# Patient Record
Sex: Female | Born: 1990 | Race: White | Hispanic: No | Marital: Single | Smoking: Former smoker
Health system: Southern US, Community
[De-identification: ages and names within clinical notes are randomized; demographics above are authoritative.]

## PROBLEM LIST (undated history)

## (undated) DIAGNOSIS — N189 Chronic kidney disease, unspecified: Secondary | ICD-10-CM

## (undated) DIAGNOSIS — F319 Bipolar disorder, unspecified: Secondary | ICD-10-CM

## (undated) DIAGNOSIS — A609 Anogenital herpesviral infection, unspecified: Secondary | ICD-10-CM

## (undated) DIAGNOSIS — F329 Major depressive disorder, single episode, unspecified: Secondary | ICD-10-CM

## (undated) DIAGNOSIS — R300 Dysuria: Secondary | ICD-10-CM

## (undated) DIAGNOSIS — L708 Other acne: Secondary | ICD-10-CM

## (undated) DIAGNOSIS — F1011 Alcohol abuse, in remission: Secondary | ICD-10-CM

## (undated) DIAGNOSIS — R1084 Generalized abdominal pain: Secondary | ICD-10-CM

## (undated) DIAGNOSIS — F32A Depression, unspecified: Secondary | ICD-10-CM

## (undated) DIAGNOSIS — F988 Other specified behavioral and emotional disorders with onset usually occurring in childhood and adolescence: Secondary | ICD-10-CM

## (undated) DIAGNOSIS — Z87448 Personal history of other diseases of urinary system: Secondary | ICD-10-CM

## (undated) DIAGNOSIS — F1010 Alcohol abuse, uncomplicated: Secondary | ICD-10-CM

## (undated) DIAGNOSIS — R21 Rash and other nonspecific skin eruption: Secondary | ICD-10-CM

## (undated) DIAGNOSIS — F419 Anxiety disorder, unspecified: Secondary | ICD-10-CM

## (undated) DIAGNOSIS — J45990 Exercise induced bronchospasm: Secondary | ICD-10-CM

## (undated) DIAGNOSIS — R42 Dizziness and giddiness: Secondary | ICD-10-CM

## (undated) DIAGNOSIS — N946 Dysmenorrhea, unspecified: Secondary | ICD-10-CM

## (undated) DIAGNOSIS — IMO0002 Reserved for concepts with insufficient information to code with codable children: Secondary | ICD-10-CM

## (undated) HISTORY — DX: Dysmenorrhea, unspecified: N94.6

## (undated) HISTORY — DX: Reserved for concepts with insufficient information to code with codable children: IMO0002

## (undated) HISTORY — DX: Anxiety disorder, unspecified: F41.9

## (undated) HISTORY — DX: Chronic kidney disease, unspecified: N18.9

## (undated) HISTORY — DX: Dysuria: R30.0

## (undated) HISTORY — DX: Other acne: L70.8

## (undated) HISTORY — DX: Major depressive disorder, single episode, unspecified: F32.9

## (undated) HISTORY — DX: Other specified behavioral and emotional disorders with onset usually occurring in childhood and adolescence: F98.8

## (undated) HISTORY — DX: Anogenital herpesviral infection, unspecified: A60.9

## (undated) HISTORY — DX: Depression, unspecified: F32.A

## (undated) HISTORY — DX: Generalized abdominal pain: R10.84

## (undated) HISTORY — DX: Exercise induced bronchospasm: J45.990

## (undated) HISTORY — DX: Personal history of other diseases of urinary system: Z87.448

## (undated) HISTORY — DX: Bipolar disorder, unspecified: F31.9

## (undated) HISTORY — DX: Alcohol abuse, uncomplicated: F10.10

## (undated) HISTORY — DX: Alcohol abuse, in remission: F10.11

## (undated) HISTORY — DX: Dizziness and giddiness: R42

## (undated) HISTORY — DX: Rash and other nonspecific skin eruption: R21

---
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
## 2010-02-02 ENCOUNTER — Telehealth: Payer: Self-pay | Admitting: Internal Medicine

## 2010-02-02 ENCOUNTER — Ambulatory Visit: Payer: Self-pay | Admitting: Internal Medicine

## 2010-02-02 DIAGNOSIS — R3989 Other symptoms and signs involving the genitourinary system: Secondary | ICD-10-CM | POA: Insufficient documentation

## 2010-02-02 LAB — CONVERTED CEMR LAB
Glucose, Urine, Semiquant: NEGATIVE
Nitrite: NEGATIVE
Specific Gravity, Urine: >=1.03
Urobilinogen, UA: 0.2

## 2010-02-03 ENCOUNTER — Encounter: Payer: Self-pay | Admitting: Internal Medicine

## 2010-02-18 ENCOUNTER — Ambulatory Visit
Admission: RE | Admit: 2010-02-18 | Discharge: 2010-02-18 | Payer: Self-pay | Source: Home / Self Care | Attending: Internal Medicine | Admitting: Internal Medicine

## 2010-02-18 ENCOUNTER — Other Ambulatory Visit: Payer: Self-pay | Admitting: Internal Medicine

## 2010-02-18 LAB — URINALYSIS, ROUTINE W REFLEX MICROSCOPIC
Bilirubin Urine: NEGATIVE
Ketones, ur: 15
Leukocytes, UA: NEGATIVE
Nitrite: NEGATIVE
Specific Gravity, Urine: 1.025 (ref 1.000–1.030)
Urine Glucose: NEGATIVE
Urobilinogen, UA: 0.2 (ref 0.0–1.0)
pH: 6 (ref 5.0–8.0)

## 2010-03-02 ENCOUNTER — Encounter: Payer: Self-pay | Admitting: Internal Medicine

## 2010-03-03 ENCOUNTER — Ambulatory Visit
Admission: RE | Admit: 2010-03-03 | Discharge: 2010-03-03 | Payer: Self-pay | Source: Home / Self Care | Attending: Internal Medicine | Admitting: Internal Medicine

## 2010-03-03 ENCOUNTER — Encounter: Payer: Self-pay | Admitting: Internal Medicine

## 2010-03-03 DIAGNOSIS — N926 Irregular menstruation, unspecified: Secondary | ICD-10-CM | POA: Insufficient documentation

## 2010-03-03 DIAGNOSIS — G479 Sleep disorder, unspecified: Secondary | ICD-10-CM | POA: Insufficient documentation

## 2010-03-03 LAB — CONVERTED CEMR LAB
Beta hcg, urine, semiquantitative: NEGATIVE
Bilirubin Urine: NEGATIVE
Blood in Urine, dipstick: NEGATIVE
Glucose, Urine, Semiquant: NEGATIVE
Nitrite: NEGATIVE
Specific Gravity, Urine: >=1.03
Urobilinogen, UA: 0.2
WBC Urine, dipstick: NEGATIVE
pH: 6

## 2010-03-09 LAB — CONVERTED CEMR LAB
Chlamydia, Swab/Urine, PCR: NEGATIVE
GC Probe Amp, Urine: NEGATIVE

## 2010-03-11 ENCOUNTER — Ambulatory Visit
Admission: RE | Admit: 2010-03-11 | Discharge: 2010-03-11 | Payer: Self-pay | Source: Home / Self Care | Attending: Pediatrics | Admitting: Pediatrics

## 2010-03-12 ENCOUNTER — Telehealth: Payer: Self-pay | Admitting: Internal Medicine
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
## 2010-03-17 NOTE — Letter (Signed)
Summary: [facility]   Imported By: Maryln Gottron 06/25/2009 14:28:55  _____________________________________________________________________  External Attachment:    Type:   Image     Comment:   External Document
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
## 2010-03-17 NOTE — Assessment & Plan Note (Signed)
Summary: FLU SHOT/CCM   Nurse Visit       Influenza Vaccine    Vaccine Type: Fluvax 3+    Given by: Arcola Jansky, RN  Flu Vaccine Consent Questions    Do you have a history of severe allergic reactions to this vaccine? no    Any prior history of allergic reactions to egg and/or gelatin? no    Do you have a sensitivity to the preservative Thimersol? no    Do you have a past history of Guillan-Barre Syndrome? no    Do you currently have an acute febrile illness? no    Have you ever had a severe reaction to latex? no    Vaccine information given and explained to patient? yes    Are you currently pregnant? no   Impression & Recommendations:  Problem # 1:  Preventive Health Care (ICD-V70.0) lot [id number], EXP 30 jun 09, sanofi pasteur left deltoid IM, 0.5 cc.   Other Orders: Flu Vaccine 64yrs + (09811) Admin 1st Vaccine (91478)   Orders Added: 1)  Flu Vaccine 3yrs + [90658] 2)  Admin 1st Vaccine [email or web address]    ]

## 2010-03-17 NOTE — Progress Notes (Signed)
Summary: ov asap-scab passed during period vs blood clot  Phone Note Call from Patient Call back at Home Phone [phone]   Caller: Mom-laura Call For: Madelin Headings MD Summary of Call: pt has something going on in private area req ov asap Initial call taken by: Heron Sabins,  September 03, 2009 10:04 AM  Follow-up for Phone Call        Spoke to pt's mom and she said that a scab came out of her daughter while her daugher is on her period. This is her 2nd day of her period. Mom looked at it and it didn't look like a blood clot because mom gets these all the time.  Bobbie at Dr. Sanjuana Letters office discuss with pt about going on Wellbutrin but couldn't rx. So pt told mom that she does want to do this to help her quit smoking.  Follow-up by: Romualdo Bolk, CMA Duncan Dull),  September 03, 2009 10:31 AM  Additional Follow-up for Phone Call Additional follow up Details #1::        I also spoke to pt and she stated that when she was changing her tampon. She saw what looked like tissue on the tampon. It was 2 in long. It was more dark in color and roundish. She is unsure if it was a scab or not.  Additional Follow-up by: Romualdo Bolk, CMA (AAMA),  September 03, 2009 11:20 AM    Additional Follow-up for Phone Call Additional follow up Details #2::    doesnt sound alarming... call if recurring.   and bring in  specimen if needed. Also  have Developmental associates get [place] a summary  of their  recent recs   wellbutrin  and then we can have Ov to discuss Follow-up by: Madelin Headings MD,  September 03, 2009 5:24 PM  Additional Follow-up for Phone Call Additional follow up Details #3:: Details for Additional Follow-up Action Taken: Pt aware of this and will have Dr. Sanjuana Letters office fax over their recommendations. Additional Follow-up by: Romualdo Bolk, CMA (AAMA),  September 03, 2009 5:26 PM

## 2010-03-17 NOTE — Progress Notes (Signed)
Summary: refill  Phone Note Refill Request Message from:  Fax from Pharmacy  Refills Requested: Medication #1:  YASMIN 28 3-0.03 MG  TABS 1 by mouth once daily [facility] fax---[phone]  Initial call taken by: Warnell Forester,  March 18, 2009 9:19 AM  Follow-up for Phone Call        Per Dr. Fabian Sharp- okay x 1 but needs a wellness check.  Follow-up by: Romualdo Bolk, CMA (AAMA),  March 18, 2009 5:20 PM  Additional Follow-up for Phone Call Additional follow up Details #1::        Rx sent electronically. Left message for mom to have pt call back and schedule pt for a cpx. Additional Follow-up by: Romualdo Bolk, CMA (AAMA),  March 19, 2009 1:35 PM    Prescriptions: YASMIN 28 3-0.03 MG  TABS (DROSPIRENONE-ETHINYL ESTRADIOL) 1 by mouth once daily  #3 packs x 0   Entered by:   Romualdo Bolk, CMA (AAMA)   Authorized by:   Madelin Headings MD   Signed by:   Romualdo Bolk, CMA (AAMA) on 03/19/2009   Method used:   Electronically to        [facility]* (mail-order)             ,          Ph: [phone]       Fax: [phone]   RxID:   [id number]

## 2010-03-17 NOTE — Progress Notes (Signed)
Summary: What kind of OV does this pt need?   Phone Note Call from Patient Call back at Home Phone [phone]   Caller: PATIENT MOTHER Call For: [facility]  Summary of Call: COUPLE OF ASTHMA EPISODES.  HER MENTRAUL CYCLES AND SOME ISSUES WITH THAT  Initial call taken by: Roselle Locus,  April 20, 2007 10:53 AM  Follow-up for Phone Call        [facility], pt should be scheduled for OV with Dr Fabian Sharp.  Pt mother called back, has a question about what kind of appt her 20 year old needs.  Two problems, asthma/breathing problems and menstural/teenage concerns.  Mom want to discuss "the pill" with Dr Fabian Sharp and daughter at [facility]. Can both problems be taken care of at same OV? Follow-up by: Sid Falcon LPN,  April 20, 2007 2:48 PM  Additional Follow-up for Phone Call Additional follow up Details #1::        Ok to do either a Tuew or Wed at 4pm and block next slot or next [facility]. Additional Follow-up by: Romualdo Bolk, CMA,  April 21, 2007 2:45 PM    Additional Follow-up for Phone Call Additional follow up Details #2::    Pt is being scheduled to 3/13 @ 4 pm. ..................................................................Marland KitchenSid Falcon LPN  April 20, 1476 4:59 PM

## 2010-03-17 NOTE — Assessment & Plan Note (Signed)
Summary: ROV AND PAP/PELVIC OV/NTA   Vital Signs:  Patient Profile:   20 Years Old Female Height:     64.25 inches (164.47 cm) Weight:      112 pounds Pulse rate:   100 / minute BP sitting:   104 / 62  (right arm) Cuff size:   regular  Vitals Entered By: Romualdo Bolk, CMA (January 22, 2008 4:07 PM)                 Chief Complaint:  Pap.  History of Present Illness: Alexis [name] is here for a pap and pelvic. and .fu of medication  LMP: 1 month ago.  No se of med and thinks it helps  mood somewhat .    Sleeping ok.  NO new signs , Somewhat with cramps.  Feels like she wants to stay on the med.   NOn smoker . NO CP sob or swelling.  Asthma stable (EIA) ADHD NO change  on meds.     Prior Medications Reviewed Using: Patient Recall  Prior Medication List:  VYVANSE 50 MG  CAPS (LISDEXAMFETAMINE DIMESYLATE) 1 by mouth once daily MULTIPLE VITAMIN   TABS (MULTIPLE VITAMIN)  YASMIN 28 3-0.03 MG  TABS (DROSPIRENONE-ETHINYL ESTRADIOL) 1 by mouth once daily PROVENTIL HFA 108 (90 BASE) MCG/ACT  AERS (ALBUTEROL SULFATE) 1-2 puffs q6 hours prn   Current Allergies (reviewed today): No known allergies   Past Medical History:    ADHD    asthma  mostly EIA  with some allergy    Birth [redacted] weeks gestational DM  8-8 oz    Family History:    uncle with seizure ? MR    Mom had MI     no scd arrythmia in family.     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   Lungs:      Clear to ausc, no crackles, rhonchi or wheezing, no grunting, flaring or  retractions  Heart:      RRR without murmur  Abdomen:      BS+, soft, non-tender, no masses, no hepatosplenomegaly  Genitalia:      Pelvic Exam External: normal female genitalia without lesions or masses Vagina: normal without lesions or masses Cervix: normal without lesions or masses flat  ectopy 1+ Adnexa: normal bimanual exam without masses or fullness Uterus: normal by palpation Pap smear: performed Pulses:      femoral pulses present  Extremities:      no cce  Neurologic:      non focal Developmental:      alert and cooperative    Review of Systems  The patient denies anorexia, fever, weight loss, chest pain, peripheral edema, prolonged cough, abdominal pain, abnormal bleeding, and enlarged lymph nodes.  Impression & Recommendations:  Problem # 1:  PRIMARY DYSMENORRHEA (ICD-625.3) normal  exam  no se of meds at present Her updated medication list for this problem includes:    Yasmin 28 3-0.03 Mg Tabs (Drospirenone-ethinyl estradiol) .Marland Kitchen... 1 by mouth once daily  Orders: Est. Patient Level IV (16109) Obtaining Screening PAP Smear (U0454)   Problem # 2:  PREMENSTRUAL TENSION SYNDROMES MOOD (ICD-625.4) Assessment: Improved  Orders: Est. Patient Level IV (09811) Obtaining Screening PAP Smear (B1478)   Problem # 3:  ATTENTION DEFICIT DISORDER (ICD-314.00) no change no obv se of meds Her updated medication list for this problem includes:    Vyvanse 50 Mg Caps (Lisdexamfetamine dimesylate) .Marland Kitchen... 1 by mouth once daily  Orders: Est. Patient Level IV (29562) Obtaining Screening PAP Smear (Z3086)   Problem # 4:  ACNE VULGARIS (ICD-706.1) some improvement  Orders: Est. Patient Level IV (57846) Obtaining Screening PAP Smear (N6295)    Patient Instructions: 1)  refill med   and rov in 6 months for med check etc or as needed .    Prescriptions: YASMIN 28 3-0.03 MG  TABS (DROSPIRENONE-ETHINYL ESTRADIOL) 1 by mouth once daily  #3 packs x 3   Entered by:    Romualdo Bolk, CMA   Authorized by:   Madelin Headings MD   Signed by:   Romualdo Bolk, CMA on 01/22/2008   Method used:   Electronically to        [facility]* (mail-order)             ,          Ph: [phone]       Fax: [phone]   RxID:   [id number]  ]

## 2010-03-17 NOTE — Assessment & Plan Note (Signed)
Summary: uti/dm   Vital Signs:  Patient profile:   20 year old female Menstrual status:  irregular LMP:     08/01/2008 Height:      64.25 inches Weight:      112 pounds BMI:     19.14 Temp:     98.1 degrees F oral Pulse rate:   66 / minute BP sitting:   100 / 70  (right arm) Cuff size:   regular  Vitals Entered By: Romualdo Bolk, CMA (August 08, 2008 11:01 AM) CC: Burning upon urination, frequency that started on 08/05/08 LMP (date): 08/01/2008 LMP - Character: spotting  years   days  Menstrual Status irregular Enter LMP: 08/01/2008 Last PAP Result NEGATIVE FOR INTRAEPITHELIAL LESIONS OR MALIGNANCY.   History of Present Illness: Alexis [name] comes in today  for acute problem with above .sx   She has had 4-5 days of dyuria vaginal soreness some discharge but no abdominal pain and   No fever  or rash   Swollen glands ? neck.  No antibiotics no yeast rx. No hx of similar .sx .   New partner x 1 with condom about 2 weeks ago.   Diarrhea and hives for 1 days weeks ago.   Preventive Screening-Counseling & Management  Alcohol-Tobacco     Alcohol drinks/day: <1     Smoking Status: never  Caffeine-Diet-Exercise     Caffeine use/day: 1-4     Does Patient Exercise: yes  Current Medications (verified): 1)  Vyvanse 50 Mg  Caps (Lisdexamfetamine Dimesylate) .Marland Kitchen.. 1 By Mouth Once Daily 2)  Multiple Vitamin   Tabs (Multiple Vitamin) 3)  Yasmin 28 3-0.03 Mg  Tabs (Drospirenone-Ethinyl Estradiol) .Marland Kitchen.. 1 By Mouth Once Daily 4)  Proventil Hfa 108 (90 Base) Mcg/act  Aers (Albuterol Sulfate) .Marland Kitchen.. 1-2 Puffs Q6 Hours Prn  Allergies (verified): No Known Drug Allergies  Past History:  Past medical, surgical, family and social histories (including risk factors) reviewed, and no changes noted (except as noted below).  Past Medical History: Reviewed history from 01/22/2008 and no changes required. ADHD asthma  mostly EIA  with some allergy Birth [redacted] weeks gestational DM  8-8 oz     Past Surgical History: Reviewed history from 04/28/2007 and no changes required. None  Past History:  Care Management: None Current  Family History: Reviewed history from 01/22/2008 and no changes required. uncle with seizure ? MR Mom had MI  no scd arrythmia in family.  No blood clots DM Asthma allergy    Social History: Reviewed history from 01/22/2008 and no changes required. 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    Physical Exam  General:  Well-developed,well-nourished,in no acute distress; alert,appropriate and cooperative throughout examination Head:  normocephalic and atraumatic.   Neck:  No deformities, masses, or tenderness noted. Lungs:  normal respiratory effort and no intercostal retractions.   Heart:  normal rate and regular rhythm.   Abdomen:  Bowel sounds positive,abdomen soft and non-tender without masses, organomegaly or hernias noted. Genitalia:  scattered small ulcer and pustules that are discrete and tender  cx os clear pink white dc no ulcers   culture obtained Skin:  turgor normal, color normal, no ecchymoses, and no petechiae.   Cervical Nodes:  No lymphadenopathy noted Axillary Nodes:  No palpable lymphadenopathy Inguinal Nodes:  right  tender inguinal node mobile 1.5 cm   Psych:  Oriented X3, good eye contact, and not anxious appearing.  appropriately concerned tearful .    Impression & Recommendations:  Problem # 1:   MUCOSITIS ULCERATIVE OF CERVIX VAGINA AND VULVA (ICD-616.81)  Orders: T- * Misc. Laboratory test [phone]) T-Culture, Urine ([id number])  Problem # 2:  ? of HERPETIC ULCERATION OF VULVA (ICD-054.12) Assessment: Comment Only counseled  and disc about rx pending culture . Disc transmissioin  etc.  empiric treatment at present..   Plan getting hiv rpr etc when  returns.   Problem # 3:  DYSURIA (ICD-788.1)  Orders: UA Dipstick w/o Micro (automated)  (81003)  Complete Medication List: 1)  Vyvanse 50 Mg Caps (Lisdexamfetamine dimesylate) .Marland Kitchen.. 1 by mouth once daily 2)  Multiple Vitamin Tabs (Multiple vitamin) 3)  Yasmin 28 3-0.03 Mg Tabs (Drospirenone-ethinyl estradiol) .Marland Kitchen.. 1 by mouth once daily 4)  Proventil Hfa 108 (90 Base) Mcg/act Aers (Albuterol sulfate) .Marland Kitchen.. 1-2 puffs q6 hours prn 5)  Valacyclovir Hcl 1000 Mg Tabs (Valacyclovir hcl) .Marland Kitchen.. 1 by mouth two times a day for 10 days then 1 by mouth once daily or as directed.  Other Orders: T-GC Probe, urine [phone]) T-Chlamydia  Probe, urine [phone])  Patient Instructions: 1)  You will be informed of lab results when available.  cell [id number] 2)  take medication as dicussed. 3)  return office visit in   3-4 weeks  or as needed and plan blood tests then.  Prescriptions: VALACYCLOVIR HCL 1000 MG TABS (VALACYCLOVIR HCL) 1 by mouth two times a day for 10 days then 1 by mouth once daily or as directed.  #40 x 3   Entered and Authorized by:   Madelin Headings MD   Signed by:   Madelin Headings MD on 08/08/2008   Method used:   Print then Give to Patient   RxID:   [id number]   Laboratory Results   Urine Tests    Routine Urinalysis   Color: yellow Appearance: Clear Glucose: negative   (Normal Range: Negative) Bilirubin: 1+   (Normal Range: Negative) Ketone: negative   (Normal Range: Negative) Spec. Gravity: >=1.030   (Normal Range: 1.003-1.035) Blood: 2+   (Normal Range: Negative) pH: 6.5   (Normal Range:  5.0-8.0) Protein: 2+   (Normal Range: Negative) Urobilinogen: 1.0   (Normal Range: 0-1) Nitrite: negative   (Normal Range: Negative) Leukocyte Esterace: 1+   (Normal Range: Negative)    Comments: Rita Ohara  August 08, 2008 11:27 AM

## 2010-03-17 NOTE — Assessment & Plan Note (Signed)
Summary: full physical/mae  ROV    Vital Signs:  Patient Profile:   20 Years Old Female Height:     64.25 inches (164.47 cm) Weight:      107 pounds BMI:     18.29 Pulse rate:   78 / minute BP sitting:   100 / 78  (right arm) Cuff size:   regular  Vitals Entered By: Romualdo Bolk, CMA (July 18, 2007 3:58 PM)                 Chief Complaint:  Follow up.  History of Present Illness: Alexis [name] is here for a follow up on labs.  and medication .  feels ocp helps pms mood and acne   and premenstrually.   bleeds  about 5-7 days.   SOme worry irritability  no depression    recent family stresses GF died after stroke and GM having memory problems  Vyvanse dose has decreased to 50 once daily ? if adds to mood issue.  To go on trip to [place] this summer .      Prior Medications Reviewed Using: Patient Recall  Prior Medication List:  VYVANSE 50 MG  CAPS (LISDEXAMFETAMINE DIMESYLATE) 2 by mouth once daily MULTIPLE VITAMIN   TABS (MULTIPLE VITAMIN)  ALBUTEROL 90 MCG/ACT  AERS (ALBUTEROL) 1-2 puffs q 6 hours prn YASMIN 28 3-0.03 MG  TABS (DROSPIRENONE-ETHINYL ESTRADIOL) 1 by mouth once daily PROVENTIL HFA 108 (90 BASE) MCG/ACT  AERS (ALBUTEROL SULFATE) 1-2 puffs q6 hours prn   Current Allergies (reviewed today): No known allergies   Past Medical History:    ADHD    asthma  mostly EIA  with some allergy    Birth [redacted] weeks gestational DM  8-8 oz   Family History:    uncle with seizure ? MR    Mom had MI     no scd arrythmia in family.     No blood clots    DM    Asthma allergy   Social History:    [employer]  junior      hh4      dog     no ets    No TAD     relationship of 2 years      sa     Physical Exam  General:      Well appearing adolescent,no acute distress Head:      normocephalic and atraumatic  Neck:      supple without adenopathy  Lungs:      Clear to ausc, no crackles, rhonchi or wheezing, no grunting, flaring or  retractions  Heart:      RRR without murmur  Abdomen:      BS+, soft, non-tender, no masses, no hepatosplenomegaly  Musculoskeletal:      no acute changes Neurologic:      non focal Skin:      papules on face few  Cervical nodes:      no significant adenopathy.   Psychiatric:      alert and cooperative  Additional Exam:      see labs  reviewed   Review of Systems       no new symptom weras glasses no ad pain vag symptom     Impression & Recommendations:  Problem # 1:  PREMENSTRUAL TENSION SYNDROMES MOOD (ICD-625.4) improved from pt  not from moms account  Orders: Est. 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 1 by mouth once daily  Orders: Est. Patient Level IV (16109)   Problem # 3:  ACNE VULGARIS (ICD-706.1) some improved Orders: Est. Patient Level IV (60454)   Problem # 4:  recent deaths in family   counseled about all above   more than 50% of visit       25 minutes   Problem # 5:  ATTENTION DEFICIT DISORDER (ICD-314.00)  Her updated medication list for this problem includes:    Vyvanse 50 Mg Caps (Lisdexamfetamine dimesylate) .Marland Kitchen... 1 by mouth once daily   Medications Added to Medication List This Visit: 1)  Vyvanse 50 Mg Caps (Lisdexamfetamine dimesylate) .Marland Kitchen.. 1 by mouth once daily  Other Orders: Menactra IM (09811) State- Hepatitis A Vacc Ped/Adol 2 dose (91478G) Admin 1st Vaccine (95621) Admin of Any Addtl Vaccine (30865)   Patient Instructions: 1)  schedule rov and will do pap and pelvic 4 pm appt ok in  about 46months    Prescriptions: YASMIN 28 3-0.03 MG  TABS (DROSPIRENONE-ETHINYL ESTRADIOL) 1 by mouth once daily  #1 pack x 1   Entered and Authorized by:   Madelin Headings MD   Signed by:   Madelin Headings MD on 07/18/2007   Method used:   Print then Give to Patient    RxID:   [id number] YASMIN 28 3-0.03 MG  TABS (DROSPIRENONE-ETHINYL ESTRADIOL) 1 by mouth once daily  #3 x 3   Entered and Authorized by:   Madelin Headings MD   Signed by:   Madelin Headings MD on 07/18/2007   Method used:   Print then Give to Patient   RxID:   [id number]  ]  Meningococcal Vaccine    Vaccine Type: Menactra    Site: left deltoid    Mfr: Sanofi Pasteur    Dose: 0.5 ml    Route: IM    Given by: Romualdo Bolk, CMA    Exp. Date: 06/27/2008    Lot #: [id number]  Hepatitis A Vaccine # 1    Vaccine Type: HepA (State)    Site: right deltoid    Mfr: GlaxoSmithKline    Dose: 0.5 ml    Route: IM    Given by: Romualdo Bolk, CMA    Exp. Date: 06/28/2009    Lot #: [id number]

## 2010-03-17 NOTE — Letter (Signed)
Summary: [facility]   Imported By: Maryln Gottron 06/25/2009 14:27:34  _____________________________________________________________________  External Attachment:    Type:   Image     Comment:   External Document

## 2010-03-17 NOTE — Assessment & Plan Note (Signed)
Summary: ASTHMA FOLLOW UP AND WCC/MHF  VITAL SIGNS    Entered weight:   108 lb.     Calculated Weight:   108 lb.     Height:     64.75 in.     Pulse rate:     60    Blood Pressure:   100/60 mmHg  Calculations    Body Mass Index:     18.18   Family History:    uncle with seizure    Mom had MI     no scd arrythmia in family.     No blood clots  Social History:    [employer]  junior      hh4      dog     no ets    No TAD    History     General health:     Nl     Ilnesses/Injuries:     N     Allergies:       Y     Exercise:       Y      Diet:         Nl     Work:       N     Drivers License:     Y     Menses:       Y     Future plans:         Y      Parent/Adolesc interaction:   NI     Able to interview     adolescent alone:     Y      Additional Comments:   stress with school    but coping well   some sleep problems  .  ? very busy    taking adhd med. Irreg    dairy  Takes  vyvanse   sees Dr Ferd Glassing.  8 am    some depressive feeling when near period.       Heavy frequent periods.     on albuterol    allery to [employer].    uses  pre exercise inhaler    Development/School Performance  Social/Emotional Development     What do you do for fun?:     shopping     Do you ever feel down/depressed:   yes     Who do you confide in     with your feelings?       Mom, boyfriend     Have friends/relatives     tried suicide:           no     Any thoughts of hurting yourself:   no  Physical     Feelings about your appearance?   good     Do you smoke, drink, use drugs?   no     Do you own a gun?     Is one kept in the house:     no  School     Is school work difficult for you?   no     How often are you absent?:     never  Sex     Do you date? Any steady partner:   yes     Any worries/questions about sex:   no     Have you begun having sex?       no   Vital Signs:  Patient Profile:   20 Years Old Female Height:  64.75 inches (164.47 cm) Weight:       108 pounds (49.09 kg) BMI:     18.18 BSA:     1.52 Pulse rate:   60 / minute BP sitting:   100 / 60               Vision Comments: Pt had vision check 1 week ago and now wears glasses. ..................................................................Marland KitchenRomualdo [name], CMA  April 28, 2007 4:07 PM    History of Present Illness: Alexis [name] comes in for wcc ands mom also asked about orthostatic lightheadedness.  Once when she was sick  and still slightly dizzy.  About  1 per week lasts a few  seconds. NO related to exercise and no assoc cp sob or palpitations. also   Acne on chest and back has been somewhat problematic   Mom and teen have noticed Some pms symptom and is a problem.   Moodiness irritability low tolerance and some impulsivity verbally.    Dx add rx by Dr Ferd Glassing with  high dose vyvanse which seems to be quite helpful and according to patient probably does not affect sleep. sleep however is a problem felt secondary to the stresses or junior year and trying to [facility] an a average.   Denies depression otherwise and mom says her own pms symptom got better with hormonal therapy.    Periods slighlty irregular sometimes heavy and has   cramps early in the cycle.   No SA no dysuria abd pain otherwise  Asthma  seems stable uses inhaler pre exercise   and as needed rti trigger .           Prescriptions: PROVENTIL HFA 108 (90 BASE) MCG/ACT  AERS (ALBUTEROL SULFATE) 1-2 puffs q6 hours prn  #1 x 2   Entered and Authorized by:   Madelin Headings MD   Signed by:   Madelin Headings MD on 04/28/2007   Method used:   Print then Give to Patient   RxID:   [id number] ALBUTEROL 90 MCG/ACT  AERS (ALBUTEROL) 1-2 puffs q 6 hours prn  #1 x 2   Entered and Authorized by:   Madelin Headings MD   Signed by:   Madelin Headings MD on 04/28/2007   Method used:   Print then Give to Patient   RxID:   [id number] YASMIN 28 3-0.03 MG  TABS (DROSPIRENONE-ETHINYL ESTRADIOL) 1 by  mouth once daily  #1 x 3   Entered and Authorized by:   Madelin Headings MD   Signed by:   Madelin Headings MD on 04/28/2007   Method used:   Print then Give to Patient   RxID:   [id number]    Past Medical History:    ADHD    asthma  mostly EIA  with some allergy  Past Surgical History:    None   Well Child Visit/Preventive Care  Age:  20 years old female Concerns: Follow up on asthma. Pt needs a refill on albuterol. Pt also wants to discuss acne issue.  Home:     good family relationships, communication between adolescent/parent, and has responsibilities at home Education:     As, good attendance, and special classes; All Honors or AP classes Activities:     sports/hobbies, exercise, friends, and Job; Hotel manager, Job- in [employer] Auto/Safety:     seatbelts, bike helmets, water safety, and sunscreen use Drugs:     no tobacco use, no alcohol use, and no drug use Sex:  abstinence and dating Suicide risk:     feelings of depression, suicidal ideation, and anxiety; Some anxiety- due to school work and some depression around cycle.    Physical Exam  General:      Well appearing adolescent,no acute distress verbal  and somewhat stressed but appropriate  Head:      normocephalic and atraumatic  Eyes:      PERRL, EOMI,  lids normal Ears:      TM's pearly gray with normal light reflex and landmarks, canals clear  Nose:      Clear without Rhinorrhea Mouth:      Clear without erythema, edema or exudate, mucous membranes moist teeht ok Neck:      supple without adenopathy no thyromegaly Chest wall:      no deformities or breast masses noted.  Tanner IV Breast.   Lungs:      Clear to ausc, no crackles, rhonchi or wheezing, no grunting, flaring or retractions  Heart:      RRR without murmur normal S2 and quiet precordium.  normal pulses Abdomen:      BS+, soft, non-tender, no masses, no hepatosplenomegaly  Genitalia:      Tanner IV.   Musculoskeletal:      no  scoliosis, normal gait, normal posture Pulses:      femoral pulses present without delay Extremities:      Well perfused with no cyanosis or deformity noted  Neurologic:      Neurologic exam grossly intact  Developmental:      alert and cooperative  Skin:      acne pustular on anterior chest and few on upper back  face few on the chin  Cervical nodes:      no significant adenopathy.   Axillary nodes:      no significant adenopathy.   Inguinal nodes:      no significant adenopathy.   Psychiatric:      alert and cooperative     Impression & Recommendations:  Problem # 1:  WELLNESS EXAM ADOLESCENT (ICD-V20.2) Limit sweet beverages,get appropriate calcium Vitamin D. Limit screen time, get adequate sleep. Counseled on injury prevention, healthy diet and exercise.  Orders: Est. Patient 12-17 years (16109)   Problem # 2:  POSTURAL LIGHTHEADEDNESS (ICD-780.4) sounds insignificant by hx  had ekg 2008 and was wnl.  plan lab and r/o anemia  and encourage avoid skipping meals etc. Orders: Est. Patient Level III (60454)   Problem # 3:  ATTENTION DEFICIT DISORDER (ICD-314.00) as per Dr Ferd Glassing   did discuss meds and potential side effect  and mood Her updated medication list for this problem includes:    Vyvanse 50 Mg Caps (Lisdexamfetamine dimesylate) .Marland Kitchen... 2 by mouth once daily   Problem # 4:  PREMENSTRUAL TENSION SYNDROMES MOOD (ICD-625.4) probalematic possibley all month so may not fit criteria but reaonable to try medication interventions   not interested in [place] rx   limitations of ocps discussed Orders: Est. Patient Level III (09811)   Problem # 5:  ACNE VULGARIS (ICD-706.1) Assessment: New disc rx options     ok to try hormonal therapy   in addition to topical therapy Orders: Est. Patient Level III (91478)   Problem # 6:  PRIMARY DYSMENORRHEA (ICD-625.3) mild to mod but with acne and pms sx will add ocps no ci to meds Her updated medication list for this problem  includes:    Yasmin 28 3-0.03 Mg Tabs (Drospirenone-ethinyl estradiol) .Marland Kitchen... 1 by mouth once daily  Orders: Est. Patient Level III (09811)   Problem # 7:  EXERCISE INDUCED ASTHMA (ICD-493.81) Assessment: Unchanged refill meds and reviewed use Her updated medication list for this problem includes:    Albuterol 90 Mcg/act Aers (Albuterol) .Marland Kitchen... 1-2 puffs q 6 hours prn    Proventil Hfa 108 (90 Base) Mcg/act Aers (Albuterol sulfate) .Marland Kitchen... 1-2 puffs q6 hours prn  Orders: Est. Patient Level III (91478)   Medications Added to Medication List This Visit: 1)  Vyvanse 50 Mg Caps (Lisdexamfetamine dimesylate) .... 2 by mouth once daily 2)  Multiple Vitamin Tabs (Multiple vitamin) 3)  Albuterol 90 Mcg/act Aers (Albuterol) 4)  Albuterol 90 Mcg/act Aers (Albuterol) .Marland Kitchen.. 1-2 puffs q 6 hours prn 5)  Yasmin 28 3-0.03 Mg Tabs (Drospirenone-ethinyl estradiol) .Marland Kitchen.. 1 by mouth once daily 6)  Proventil Hfa 108 (90 Base) Mcg/act Aers (Albuterol sulfate) .Marland Kitchen.. 1-2 puffs q6 hours prn   Patient Instructions: 1)  Please schedule a follow-up appointment in 3 months. 2)  schedule  fasting cpx labs 3)   in the meantime

## 2010-03-17 NOTE — Miscellaneous (Signed)
Summary: immunizations   Clinical Lists Changes  Observations: Added new observation of HEPBVAX#3: Historical (01/04/2003 9:35) Added new observation of MMR #2: Historical (06/14/1995 9:52) Added new observation of OPV #4: Historical (06/14/1995 9:52) Added new observation of HEPBVAX#2: Historical (06/14/1995 9:35) Added new observation of HEPBVAX#1: Historical (07/06/1993 9:35) Added new observation of MMR #1: Historical (01/04/1992 9:52) Added new observation of OPV #3: Historical (01/04/1992 9:52) Added new observation of DPT #4: Historical (01/04/1992 9:35) Added new observation of DPT #3: Historical (01/19/1991 9:35) Added new observation of OPV #2: Historical (11/10/1990 9:52) Added new observation of DPT #2: Historical (11/10/1990 9:35) Added new observation of OPV #1: Historical (09/04/1990 9:52) Added new observation of DPT #1: Historical (09/04/1990 9:35)      Immunization History:  Hepatitis B Immunization History:    Hepatitis B # 1:  historical (07/06/1993)    Hepatitis B # 2:  historical (06/14/1995)    Hepatitis B # 3:  historical (01/04/2003)  DPT Immunization History:    DPT # 1:  historical (09/04/1990)    DPT # 2:  historical (11/10/1990)    DPT # 3:  historical (01/19/1991)    DPT # 4:  historical (01/04/1992)  Polio Immunization History:    Polio # 1:  historical (09/04/1990)    Polio # 2:  historical (11/10/1990)    Polio # 3:  historical (01/04/1992)    Polio # 4:  historical (06/14/1995)  MMR Immunization History:    MMR # 1:  historical (01/04/1992)    MMR # 2:  historical (06/14/1995)

## 2010-03-17 NOTE — Progress Notes (Signed)
Summary: Tdap not clear on the record you gave him  Phone Note Call from Patient   Caller: father,[phone] Call For: panosh Reason for Call: Talk to Nurse Summary of Call: Needs Carollee Herter to call him today about the form you handed him of [facility] State Immunizations.  It is not clear to him that the TD/Tdap has been given.  He sees it in the date needed column.  She will be withdrawn from college 9-24 if a record that it's been given has not been provided. Initial call taken by: Rudy Jew, RN,  October 30, 2008 2:33 PM  Follow-up for Phone Call        Spoke with dad and explained that pt is going to be okay that the college will accept the immuization record. Follow-up by: Romualdo Bolk, CMA (AAMA),  October 30, 2008 2:46 PM

## 2010-03-17 NOTE — Letter (Signed)
Summary: Out of School  [place] [facility]  [address] [place], [place] [address]   Phone: [phone]  Fax: [phone]    December 22, 2007   Student:  Alexis [name]    To Whom It May Concern:   For Medical reasons, please excuse the above named student from school for the following dates:  Start:   November  5th 2009  End:    November  6th  2009  If you need additional information, please feel free to contact our office.   Sincerely,    Madelin Headings MD    ****This is a legal document and cannot be tampered with.  Schools are authorized to verify all information and to do so accordingly.

## 2010-03-17 NOTE — Letter (Signed)
Summary: [facility]   Imported By: Maryln Gottron 06/25/2009 14:25:05  _____________________________________________________________________  External Attachment:    Type:   Image     Comment:   External Document

## 2010-03-17 NOTE — Progress Notes (Signed)
Summary: FYI on pt's appt for 5/5  Phone Note Call from Patient Call back at [phone]   Caller: Mom-Laura Summary of Call: Spoke to mom- Pt had UTI that went to her kidney's. [facility]. They gave her Cipro for this. They did a urine culture and she will be bring her records. Mom had a kidney stone in the past. Pt is having pt back one side and mom would like her to see [facility] of [facility]. Pt will be back in [employer]. Pt has a cpx appt on Monday. Mom states that she had the pain for 3-4 weeks before she was seen by someone. Pt had a fever 102.0. Pt will be home this summer. Mom thinks she should be done with exam by Friday of next week. Initial call taken by: Romualdo Bolk, CMA (AAMA),  Jun 19, 2009 8:47 AM

## 2010-03-17 NOTE — Assessment & Plan Note (Signed)
Summary: follow up on recent Kidney infection./dm   Vital Signs:  Patient profile:   20 year old female Menstrual status:  irregular LMP:     06/12/2009 Height:      64.25 inches Weight:      119 pounds BMI:     20.34 Pulse rate:   72 / minute BP sitting:   110 / 60  (right arm)  Vitals Entered By: Romualdo Bolk, CMA (AAMA) (Jun 19, 2009 11:34 AM) CC: Follow-up visit on UTI- Pt is still on Cipro. LMP (date): 06/12/2009 LMP - Character: spotting     Enter LMP: 06/12/2009 Last PAP Result NEGATIVE FOR INTRAEPITHELIAL LESIONS OR MALIGNANCY.   History of Present Illness: Alexis [name] comes in  after being treated for pyelonephritis on April 29 at [facility] with parenteral rocephin IVFluids  and then cipro.    full evaluation and blood work showed  e coli sens to all tested. She also has some type of nasal infection rx with topical antibiotic.  Her fever and pain  subsided beginning 2 days into rx .    Getting better  but sleepy and  and right low back pain is all but resolved .     Prev pain  7/10 and now    is "fine."   Last  fever  99 a few days ago. May 1st.  Denies se of medication.  No hematuria or vag signs .  Is having to reschedule   some  of her exams.    Mom was concerned because of family hx of stones  and infection. patient wihtout previous hx of same.   Preventive Screening-Counseling & Management  Alcohol-Tobacco     Alcohol drinks/day: <1     Smoking Status: never  Caffeine-Diet-Exercise     Caffeine use/day: 1-4     Does Patient Exercise: yes  Current Medications (verified): 1)  Vyvanse 50 Mg  Caps (Lisdexamfetamine Dimesylate) .Marland Kitchen.. 1 By Mouth Once Daily 2)  Multiple Vitamin   Tabs (Multiple Vitamin) 3)  Yasmin 28 3-0.03 Mg  Tabs (Drospirenone-Ethinyl Estradiol) .Marland Kitchen.. 1 By Mouth Once Daily 4)  Proventil Hfa 108 (90 Base) Mcg/act  Aers (Albuterol Sulfate) .Marland Kitchen.. 1-2 Puffs Q6 Hours Prn 5)  Valacyclovir Hcl 1000 Mg Tabs (Valacyclovir Hcl) .Marland Kitchen.. 1 By  Mouth Two Times A Day For 10 Days Then 1 By Mouth Once Daily or As Directed.  Allergies (verified): No Known Drug Allergies  Past History:  Past Medical History: ADHD asthma  mostly EIA  with some allergy Birth [redacted] weeks gestational DM  8-8 oz  Right Pyelonephritis  4 2011  Past History:  Care Management: None [facility]  Social History: Uncch history and english  fresh sor[employer] hh4   dog  no ets No TAD  relationship of 2 years   sa   Review of Systems  The patient denies anorexia, fever, weight loss, weight gain, vision loss, chest pain, abdominal pain, melena, hematochezia, severe indigestion/heartburn, hematuria, incontinence, muscle weakness, difficulty walking, abnormal bleeding, enlarged lymph nodes, and angioedema.         still tired  but better.  Physical Exam  General:  Well-developed,well-nourished,in no acute distress; alert,appropriate and cooperative throughout examination Head:  normocephalic and atraumatic.   Eyes:  vision grossly intact, pupils equal, and pupils round.   Ears:  R ear normal and L ear normal.   Mouth:  pharynx pink and moist.   Neck:  No deformities, masses, or tenderness  noted. Lungs:  Normal respiratory effort, chest expands symmetrically. Lungs are clear to auscultation, no crackles or wheezes. Heart:  Normal rate and regular rhythm. S1 and S2 normal without gallop, murmur, click, rub or other extra sounds. Abdomen:  Bowel sounds positive,abdomen soft and non-tender without masses, organomegaly or hernias noted. no flank pain Pulses:  nl cap refill  Extremities:  no clubbing cyanosis or edema  Neurologic:  alert & oriented X3, strength normal in all extremities, and gait normal.   Skin:  turgor normal, color normal, no ecchymoses, no petechiae, and no purpura.   Cervical Nodes:  No lymphadenopathy noted Psych:  Oriented X3, normally interactive, not anxious appearing, and not depressed appearing.     reviewed [facility] notes and labs    Impression & Recommendations:  Problem # 1:  PYELONEPHRITIS RIGHT (ICD-590.80)  by hx convalescing as expected and no symptom of   stone  at present  . Expectant management discussed woth her and etiology of dx. will get [place]  and observe closely .   at this time no need for urology consult unless relapsing or continueing problem.   Orders: Radiology Referral (Radiology)  Complete Medication List: 1)  Vyvanse 50 Mg Caps (Lisdexamfetamine dimesylate) .Marland Kitchen.. 1 by mouth once daily 2)  Multiple Vitamin Tabs (Multiple vitamin) 3)  Yasmin 28 3-0.03 Mg Tabs (Drospirenone-ethinyl estradiol) .Marland Kitchen.. 1 by mouth once daily 4)  Proventil Hfa 108 (90 Base) Mcg/act Aers (Albuterol sulfate) .Marland Kitchen.. 1-2 puffs q6 hours prn 5)  Valacyclovir Hcl 1000 Mg Tabs (Valacyclovir hcl) .Marland Kitchen.. 1 by mouth two times a day for 10 days then 1 by mouth once daily or as directed.  Other Orders: UA Dipstick w/o Micro (automated)  (81003)  Patient Instructions: 1)  your urine is clear today . 2)  finish the antibiotic as directed . 3)  will notify you about getting a renal ultrasound.  4)  And then rov after the result back.  5)  if ANY  signs of concern  call and reasess .   as needed.   Laboratory Results   Urine Tests  Date/Time Recieved: Jun 19, 2009 12:13 PM  Date/Time Reported: Jun 19, 2009 12:13 PM   Routine Urinalysis   Color: yellow Appearance: Clear Glucose: negative   (Normal Range: Negative) Bilirubin: negative   (Normal Range: Negative) Ketone: negative   (Normal Range: Negative) Spec. Gravity: 1.020   (Normal Range: 1.003-1.035) Blood: negative   (Normal Range: Negative) pH: 7.0   (Normal Range: 5.0-8.0) Protein: negative   (Normal Range: Negative) Urobilinogen: 0.2   (Normal Range: 0-1) Nitrite: negative   (Normal Range: Negative) Leukocyte Esterace: negative   (Normal Range: Negative)    Comments: Wynona Canes, CMA  Jun 19, 2009 12:13 PM

## 2010-03-19 NOTE — Assessment & Plan Note (Signed)
Summary: break thru bleeding on Yaz/dm   Vital Signs:  Patient profile:   20 year old female Menstrual status:  irregular LMP:     03/01/2010 Weight:      114 pounds Pulse rate:   88 / minute BP sitting:   120 / 80  (right arm) Cuff size:   regular  Vitals Entered By: Romualdo Bolk, CMA (AAMA) (March 03, 2010 2:35 PM) CC: Break thru bleeding more noticable this last month. But has been going on for awhile. Pt has been know miss pills. Trouble sleeping but has increased Zoloft 100mg . Pt wants to know if this could be a problem with zoloft? LMP (date): 03/01/2010 LMP - Character: spotting Menarche (age onset years): 10    Enter LMP: 03/01/2010 Last PAP Result NEGATIVE FOR INTRAEPITHELIAL LESIONS OR MALIGNANCY.   History of Present Illness: Alexis [name] comes in today  with mom for above.   She co of  Spotting slight most days .    Never been as persistant  this.  although she has had some irregular bleeding in the past.  No recent missed pills and worried about  mood changes  if she changes medication otherwise.  she states that the current medication  helps with pms symptoms .  Saw Psych on Jan 6th at [facility]. 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 She was placed on Zoloft buy Clinical research associate or psychiatrist    was on 75 for a week and had "2 bad  days  " ... trying to get   local care.   she has stopped alcohol. More than three partners in the last few months none recently no vaginal symptoms no, no pain no urinary tract symptoms currently. ADHD: still on five bands that she does not think interferes with her sleep or her mood she believes that helps her.  Preventive Screening-Counseling & Management  Alcohol-Tobacco     Alcohol drinks/day: weekend drinker     Smoking Status: never     Packs/Day: 1/4     Passive Smoke Exposure: no  Caffeine-Diet-Exercise  Caffeine use/day: yes carbonated, yes caffeine, 8-16 oz/day     Diet Comments: all four food groups, good appetite     Does Patient Exercise: yes  Current Medications (verified): 1)  Vyvanse 50 Mg  Caps (Lisdexamfetamine Dimesylate) .Marland Kitchen.. 1 By Mouth Once Daily 2)  Multiple Vitamin   Tabs (Multiple Vitamin) 3)  Yasmin 28 3-0.03 Mg  Tabs (Drospirenone-Ethinyl Estradiol) .Marland Kitchen.. 1 By Mouth Once Daily 4)  Proventil Hfa 108 (90 Base) Mcg/act  Aers (Albuterol Sulfate) .Marland Kitchen.. 1-2 Puffs Q6 Hours Prn 5)  Valacyclovir Hcl 1000 Mg Tabs (Valacyclovir Hcl) .Marland Kitchen.. 1 By Mouth Two Times A Day For 10 Days Then 1 By Mouth Once Daily or As Directed. 6)  Zoloft 100 Mg Tabs (Sertraline Hcl) .Marland Kitchen.. 1 By Mouth Once Daily  Allergies (verified): No Known Drug Allergies  Past History:  Care Management: None [facility]: Dr. Gareth Eagle in [place] and therapist at [facility]; [place] at [employer].   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  hh4   dog  no ets No TAD    sa    Etoh to point of amnesia more than 3 times this  past year  none recently    Review of Systems  The patient denies anorexia, fever, weight loss, weight gain, vision loss, decreased hearing, abdominal pain, severe indigestion/heartburn, hematuria, incontinence, genital sores, transient blindness, difficulty walking, unusual weight change, enlarged lymph nodes, and angioedema.         no unusual bleeding other than the breakthrough bleeding  Physical Exam  General:  Well-developed,well-nourished,in no acute distress; alert,appropriate and cooperative throughout examination  discussion with mother and  alone Head:  normocephalic and atraumatic.   Eyes:  PERRL, EOMs full, conjunctiva clear  Neck:  supple without adenopathy  Lungs:  Normal respiratory effort, chest expands symmetrically. Lungs are clear to auscultation, no  crackles or wheezes. Heart:  Normal rate and regular rhythm. S1 and S2 normal without gallop, murmur, click, rub or other extra sounds. Abdomen:  no flank pain  no abd tendernessno rebound tenderness, no hepatomegaly, and no splenomegaly.   nl bs  Pulses:  pulses intact without delay   Neurologic:  non focal Skin:  turgor normal, color normal, no ecchymoses, and no petechiae.   Cervical Nodes:  No lymphadenopathy noted Psych:  Oriented X3, good eye contact, not anxious appearing, and not depressed appearing.  slightly subdued but genuine and normal cognition   Impression & Recommendations:  Problem # 1:  IRREGULAR MENSES (ICD-626.4) breakthrough bleeding is probably related to the medication but won't rule out infection because of somewhat risk history. No evidence of urinary tract infection today. Her updated medication list for this problem includes:    Zovia 1/35e (28) 1-35 Mg-mcg Tabs (Ethynodiol diac-eth estradiol) .Marland Kitchen... 1 by mouth once daily  Orders: UA Dipstick w/o Micro (automated)  (81003) T-Chlamydia & GC Probe, Urine (87491/[id number])  Problem # 2:  UNSPECIFIED SLEEP DISTURBANCE (ICD-780.50) possibly for medications and diagnosis of reactive anxiety depression ADHD. Encouraged   continued alcohol abstinence  Problem # 3:  ALCOHOL USE (ICD-305.00) not using    Problem # 4:  ATTENTION DEFICIT DISORDER (ICD-314.00)  Complete Medication List: 1)  Vyvanse 70 Mg Caps (Lisdexamfetamine dimesylate) 2)  Multiple Vitamin Tabs (Multiple vitamin) 3)  Zovia 1/35e (28) 1-35 Mg-mcg Tabs (Ethynodiol diac-eth estradiol) .Marland Kitchen.. 1 by mouth once daily 4)  Proventil Hfa 108 (90 Base) Mcg/act Aers (Albuterol sulfate) .Marland Kitchen.. 1-2 puffs q6 hours prn 5)  Valacyclovir Hcl 1000 Mg Tabs (Valacyclovir hcl) .Marland Kitchen.. 1 by mouth two times a day for 10 days then 1 by mouth once daily or as directed. 6)  Zoloft 100 Mg Tabs (Sertraline hcl) .Marland Kitchen.. 1 by mouth once daily  Patient Instructions: 1)  will call about  results . 2)  cal; if you want to try different OCP. 3)  Zoloft could cause  some sleep disturbance but should get better with time .     4)  Make appt with Dr Dub Mikes  for local care .  Prescriptions: VALACYCLOVIR HCL 1000 MG TABS (VALACYCLOVIR HCL) 1 by mouth two times a day for 10 days then 1 by mouth once daily or as directed.  #90 x 3   Entered and Authorized by:   Madelin Headings MD   Signed by:   Madelin Headings MD on 03/03/2010   Method used:   Electronically to        [facility] # [address] (retail)       [address]       [place], [place]  [address]       Ph: [phone]       Fax: [phone]  RxID:   [id number]  cell  40 can call home phone to tell her to cal about results   Orders Added: 1)  UA Dipstick w/o Micro (automated)  [81003] 2)  T-Chlamydia & GC Probe, Urine [87491/[id number]] 3)  Est. Patient Level IV [40102]    Laboratory Results   Urine Tests    Routine Urinalysis   Color: yellow Appearance: Clear Glucose: negative   (Normal Range: Negative) Bilirubin: negative   (Normal Range: Negative) Ketone: trace (5)   (Normal Range: Negative) Spec. Gravity: >=1.030   (Normal Range: 1.003-1.035) Blood: negative   (Normal Range: Negative) pH: 6.0   (Normal Range: 5.0-8.0) Protein: trace   (Normal Range: Negative) Urobilinogen: 0.2   (Normal Range: 0-1) Nitrite: negative   (Normal Range: Negative) Leukocyte Esterace: negative   (Normal Range: Negative)    Urine HCG: negative Comments: Rita Ohara  March 03, 2010 3:50 PM    greater than 50% of visit spent in counseling    25 minutes

## 2010-03-19 NOTE — Progress Notes (Signed)
  Phone Note Call from Patient   Caller: Patient Call For: Alexis Headings MD Summary of Call: Mom took pt to ADHD yesterday, and they want her changed to Zovia 35.   [phone] [facility] [place] Initial call taken by: Lynann Beaver CMA AAMA,  March 12, 2010 9:43 AM  Follow-up for Phone Call        get name of doc  and then ok to  change to zovia 35   disp x 3 months and then call  at that time about bleeding pattern. and status Follow-up by: Alexis Headings MD,  March 12, 2010 9:52 AM  Additional Follow-up for Phone Call Additional follow up Details #1::        Pt saw Benn Moulder, NPA- who works with Dr. Tacy Learn. They changed her dosage to Vyvanse 70mg  and are still discussing her zoloft dosage.  Mom aware that we going to call in rx for pt.  Additional Follow-up by: Romualdo Bolk, CMA (AAMA),  March 12, 2010 9:57 AM    New/Updated Medications: VYVANSE 70 MG CAPS (LISDEXAMFETAMINE DIMESYLATE)  ZOVIA 1/35E (28) 1-35 MG-MCG TABS (ETHYNODIOL DIAC-ETH ESTRADIOL) 1 by mouth once daily Prescriptions: ZOVIA 1/35E (28) 1-35 MG-MCG TABS (ETHYNODIOL DIAC-ETH ESTRADIOL) 1 by mouth once daily  #1 x 2   Entered by:   Romualdo Bolk, CMA (AAMA)   Authorized by:   Alexis Headings MD   Signed by:   Romualdo Bolk, CMA (AAMA) on 03/12/2010   Method used:   Electronically to        [facility] [address] (retail)       [address]       [place], [place]  [address]       Ph: [phone]       Fax: [phone]   RxID:   [id number]

## 2010-03-19 NOTE — Progress Notes (Signed)
Summary: UTI-more info on her symptoms  Phone Note Call from Patient Call back at Home Phone [phone]   Caller: Patient Call For: Madelin Headings MD Reason for Call: Acute Illness Summary of Call: Pt 's Mom wants to bring pt in some time today to bring a specimen in for UTI, and no office visit.  States Dr. Fabian Sharp told her to do this anytime.  Initial call taken by: Lynann Beaver CMA AAMA,  February 02, 2010 10:08 AM  Follow-up for Phone Call        Mom is not with daughter and this is all the information she knows is that she complains of dysuria, and Dr.Olanda Boughner told them to call for a time to just brng a specimen in when she has these probs.  Message left x 2 and Mom would like a message left at home to be seen after 2 if possible. Follow-up by: Lynann Beaver CMA AAMA,  February 02, 2010 11:00 AM  Additional Follow-up for Phone Call Additional follow up Details #1::        ok to do  UA  and culture   but but if having signs of concerns   or the urine is abnormal she will need an OV to treat .   Is mom worried about infection  and does she have fever etc. ?  She had a severe UTI in the past. Additional Follow-up by: Madelin Headings MD,  February 02, 2010 11:16 AM    Additional Follow-up for Phone Call Additional follow up Details #2::    LMTCB again Follow-up by: [facility] CMA AAMA,  February 02, 2010 11:26 AM

## 2010-03-19 NOTE — Assessment & Plan Note (Signed)
Summary: URINE SAMPLE//ALP   for vital signs see previous document labeled  for urine tests lab. done today   History of Present Illness: Alexis [name]  comes in today  for acute work in request because of ? having uti signs .She has a hx of a sever pyelopnepphritis and was told to check if any poss of recurrence.  she has soe change in her urination but not really pain  and no back pain fever or malasie with this.   no new bag signs .  Denies risk of pregnancy . Since last visit is now on zoloft for mood and follow up with psyc    no nse of this med  [facility]  on vyvanse also  Resp no asthma  problem   Preventive Screening-Counseling & Management  Alcohol-Tobacco     Alcohol drinks/day: weekend drinker     Smoking Status: never     Packs/Day: 1/4     Passive Smoke Exposure: no  Caffeine-Diet-Exercise     Caffeine use/day: yes carbonated, yes caffeine, 8-16 oz/day     Diet Comments: all four food groups, good appetite     Does Patient Exercise: yes  Allergies: No Known Drug Allergies  Past History:  Past medical, surgical, family and social histories (including risk factors) reviewed, and no changes noted (except as noted below).  Past Medical History: ADHD  speech therapy 5 years  asthma  mostly EIA  with some allergy Birth [redacted] weeks gestational DM  8-8 oz  Right Pyelonephritis  4 2011 Mood   Past Surgical History: Reviewed history from 04/28/2007 and no changes required. None  Family History: Reviewed history from 07/22/2009 and no changes required. uncle with seizure ? MR Mom had MI  no scd arrythmia in family.  No blood clots DM Asthma allergy   Mom 5 0 fa 6 0   Social History: Reviewed history from 07/22/2009 and no changes required. Uncch history and english   sopho  hh4   dog  no ets No TAD  relationship of 2 years   sa    Etoh to point of amnesia more than 3 times this  past year   none recently    Review of  Systems  The patient denies anorexia, fever, weight loss, weight gain, vision loss, abdominal pain, hematuria, incontinence, genital sores, muscle weakness, abnormal bleeding, and enlarged lymph nodes.   for vital signs see  previous document  Physical Exam  General:  Well-developed,well-nourished,in no acute distress; alert,appropriate and cooperative throughout examination Head:  normocephalic and atraumatic.   Neck:  supple without adenopathy  Lungs:  normal respiratory effort, no intercostal retractions, no accessory muscle use, and normal breath sounds.   Heart:  normal rate, regular rhythm, and no murmur.   Abdomen:  no flank pain  mininmal no no abd tendernessno rebound tenderness, no hepatomegaly, and no splenomegaly.   nl bs  Pulses:  pulses intact without delay   Neurologic:  non focal  Skin:  turgor normal, color normal, no ecchymoses, and no petechiae.   Cervical Nodes:  No lymphadenopathy noted Psych:  Oriented X3, normally interactive, good eye contact, and not anxious appearing.  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 1 by mouth once daily 2)  Multiple Vitamin Tabs (Multiple vitamin) 3)  Yasmin 28 3-0.03 Mg Tabs (Drospirenone-ethinyl estradiol) .Marland Kitchen.. 1 by mouth once daily 4)  Proventil Hfa 108 (90 Base) Mcg/act Aers (Albuterol sulfate) .Marland Kitchen.. 1-2 puffs q6 hours prn 5)  Valacyclovir Hcl 1000 Mg Tabs (Valacyclovir hcl) .Marland Kitchen.. 1 by mouth two times a day for 10 days then 1 by mouth  once daily or as directed. 6)  Sertraline Hcl 50 Mg Tabs (Sertraline hcl) .Marland Kitchen.. 1 by mouth once daily 7)  Nitrofurantoin Monohyd Macro 100 Mg Caps (Nitrofurantoin monohyd macro) .Marland Kitchen.. 1 by mouth two times a day for uti  Patient Instructions: 1)  will contaact about labs   2)  take meds and follow up  as appropriate.  3)  see previous lab document    Orders Added: 1)  Est. Patient Level IV [04540]

## 2010-05-11 ENCOUNTER — Ambulatory Visit (INDEPENDENT_AMBULATORY_CARE_PROVIDER_SITE_OTHER): Payer: BC Managed Care – PPO | Admitting: Internal Medicine

## 2010-05-11 ENCOUNTER — Encounter: Payer: Self-pay | Admitting: Internal Medicine

## 2010-05-11 VITALS — Temp 98.6°F | Wt 122.0 lb

## 2010-05-11 DIAGNOSIS — L738 Other specified follicular disorders: Secondary | ICD-10-CM

## 2010-05-11 DIAGNOSIS — L739 Follicular disorder, unspecified: Secondary | ICD-10-CM

## 2010-05-11 DIAGNOSIS — F4323 Adjustment disorder with mixed anxiety and depressed mood: Secondary | ICD-10-CM

## 2010-05-11 DIAGNOSIS — F988 Other specified behavioral and emotional disorders with onset usually occurring in childhood and adolescence: Secondary | ICD-10-CM

## 2010-05-11 MED ORDER — CEPHALEXIN 500 MG PO CAPS: 500.0000 mg | ORAL_CAPSULE | Freq: Two times a day (BID) | ORAL | Status: AC

## 2010-05-11 NOTE — Patient Instructions (Signed)
This is infected folliculitis  Take antibiotic  clean razor don't shave with soap. Decrease picking as much as possible  If not better in 10 days call .

## 2010-05-17 ENCOUNTER — Encounter: Payer: Self-pay | Admitting: Internal Medicine

## 2010-05-17 DIAGNOSIS — F4323 Adjustment disorder with mixed anxiety and depressed mood: Secondary | ICD-10-CM | POA: Insufficient documentation

## 2010-05-17 NOTE — Progress Notes (Signed)
  Subjective:    Patient ID: Alexis [name], female    DOB: 1990/03/10, 20 y.o.   MRN: [id number]  HPI Patient comes in today for acute visit .She has had  It shaved papules on both legs after shaving. She tends to be a picker and these have spread over the last week or so. She usually uses a shower gel however sometimes in a hurry sheet uses soap. She denies any fever or other skin problems.  She is seeing Dr. Dub Mikes for her ADD and mood problems and is now on different medications including Abilify continuing her stimulant medication.  She does not attribute her picking to her medication. PMHx reviewed and updated Past Medical History  Diagnosis Date  . ALCOHOL USE 07/22/2009  . ATTENTION DEFICIT DISORDER 04/28/2007     hx of speech therapy  . EXERCISE INDUCED ASTHMA 04/28/2007  . MUCOSITIS ULCERATIVE OF CERVIX VAGINA AND VULVA 08/08/2008    herpes  . PRIMARY DYSMENORRHEA 04/28/2007  . ACNE VULGARIS 04/28/2007  . POSTURAL LIGHTHEADEDNESS 04/28/2007  . Rash and other nonspecific skin eruption 07/22/2009  . Dysuria 08/08/2008  . Abdominal pain, generalized 08/08/2008  . History of pyelonephritis     acute right  4 2011  . Alcohol abuse, in remission      hx of amnesia   . HSV (herpes simplex virus) anogenital infection     hx of same   No past surgical history on file.  reports that she has been smoking Cigarettes.  She has been smoking about .3 packs per day. She does not have any smokeless tobacco history on file. She reports that she does not drink alcohol or use illicit drugs. family history includes Asthma in an unspecified family member; Diabetes type II in an unspecified family member; and Heart attack in her mother. No Known Allergies   Review of Systems Negative for chest pain shortness of breath as mock her mood is much better and she's Alcohol free at this point.    Objective:   Physical Exam .Well-developed well-nourished and no acute distress. Skin:  Scattered discrete  red somewhat crusted papules follicular old based on both lower extremities below the knee a few in the thigh area. There are no vesicles are streaking.     Psych:  Oriented times three normal speech good eye contact.  Assessment & Plan:    Folliculitis  Most likely triggered by shaving and then picking. Discussed prevention healing strategies will add antibiotic for now and avoid shaving with soap minimizing picking.   Keep her nails short  Adhd Depression      Under psychiatric care medication changes appears to be more stable and making progress.

## 2010-05-20 ENCOUNTER — Institutional Professional Consult (permissible substitution): Payer: Self-pay | Admitting: Pediatrics

## 2010-05-26 ENCOUNTER — Institutional Professional Consult (permissible substitution): Payer: BC Managed Care – PPO | Admitting: Pediatrics

## 2010-05-26 DIAGNOSIS — F909 Attention-deficit hyperactivity disorder, unspecified type: Secondary | ICD-10-CM

## 2010-05-26 DIAGNOSIS — R6250 Unspecified lack of expected normal physiological development in childhood: Secondary | ICD-10-CM

## 2010-05-26 DIAGNOSIS — R279 Unspecified lack of coordination: Secondary | ICD-10-CM

## 2010-06-15 ENCOUNTER — Other Ambulatory Visit: Payer: Self-pay | Admitting: Internal Medicine

## 2010-06-30 NOTE — Letter (Signed)
August 14, 2007    To whom it may concern   RE:  Alexis [name], Alexis [name]  MRN:  [id number]  /  DOB:  1990-06-17   Dear Carmelina Dane,   This letter is being written at the request of Alexis [name], a  patient in our practice.  She is on Vyvanse 50 mg 2 once a day for ADD,  and she is on Yaz daily for heavy periods.  She is [place]  and will need prescriptions that would cover this length of time.    Sincerely,      Neta Mends. Panosh, MD  Electronically Signed    WKP/MedQ  DD: 08/14/2007  DT: 08/14/2007  Job #: [id number]

## 2010-07-03 NOTE — Assessment & Plan Note (Signed)
[facility]                                 ON-CALL NOTE   Alexis [name], Alexis [name]                   MRN:          [id number]  DATE:07/15/2008                            DOB:          1990-09-28    PRIMARY CARE Rondle Lohse:  Neta Mends. Panosh, MD   The patient is a 20 year old girl who has broken out in hives.  Discussed on the phone with mother, had no known exposures, no known new  medications, no new juices, lotions, or topical exposures.  She is not  in acute distress.  She has no trouble breathing.  She is not having any  facial or neck swelling.  The mother has already given her 75 mg of  Benadryl orally.  We discussed this and I recommended that they continue  with Benadryl 50 mg every 6 hours and also add Tagamet twice daily.  I  told the mother that she would need to watch the patient actively  continuously over the next 24 hours and if at any point she exhibits any  facial swelling, neck or lip swelling, any difficulty breathing  whatsoever that they go immediately to the emergency room.  She  indicated that she understood and agreed with plan of care.     Juleen China, MD    STC/MedQ  DD: 07/15/2008  DT: 07/15/2008  Job #: [id number]   cc:   Neta Mends. Fabian Sharp, MD

## 2010-08-16 ENCOUNTER — Other Ambulatory Visit: Payer: Self-pay | Admitting: Internal Medicine

## 2010-09-06 ENCOUNTER — Other Ambulatory Visit: Payer: Self-pay | Admitting: Internal Medicine

## 2010-09-07 NOTE — Telephone Encounter (Signed)
rx sent to pharmacy

## 2010-09-26 ENCOUNTER — Other Ambulatory Visit: Payer: Self-pay | Admitting: Internal Medicine

## 2010-12-14 ENCOUNTER — Ambulatory Visit (INDEPENDENT_AMBULATORY_CARE_PROVIDER_SITE_OTHER): Payer: BC Managed Care – PPO

## 2010-12-14 DIAGNOSIS — Z23 Encounter for immunization: Secondary | ICD-10-CM

## 2011-02-20 ENCOUNTER — Other Ambulatory Visit: Payer: Self-pay | Admitting: Internal Medicine

## 2011-04-05 ENCOUNTER — Other Ambulatory Visit: Payer: Self-pay | Admitting: Internal Medicine

## 2011-04-14 ENCOUNTER — Telehealth: Payer: Self-pay | Admitting: *Deleted

## 2011-04-14 NOTE — Telephone Encounter (Signed)
Pt would like to speak to [facility] re: pt's medical history before her next appt or email her..  Please call her at your convenience for a discussion.  She cannot come to [facility] next appt March 12.

## 2011-04-14 NOTE — Telephone Encounter (Signed)
Left message to call back  

## 2011-04-15 NOTE — Telephone Encounter (Signed)
Mom called saying that pt is taking some new medications and is wondering if pt needs to have labs prior to her appt. I advised mom to have pt come in fasting and let her discuss this with Dr. Fabian Sharp.

## 2011-04-27 ENCOUNTER — Ambulatory Visit (INDEPENDENT_AMBULATORY_CARE_PROVIDER_SITE_OTHER): Payer: BC Managed Care – PPO | Admitting: Internal Medicine

## 2011-04-27 ENCOUNTER — Encounter: Payer: Self-pay | Admitting: Internal Medicine

## 2011-04-27 VITALS — BP 128/80 | HR 90 | Temp 98.1°F | Ht 65.0 in | Wt 144.0 lb

## 2011-04-27 DIAGNOSIS — Z0000 Encounter for general adult medical examination without abnormal findings: Secondary | ICD-10-CM

## 2011-04-27 DIAGNOSIS — N946 Dysmenorrhea, unspecified: Secondary | ICD-10-CM

## 2011-04-27 DIAGNOSIS — Z720 Tobacco use: Secondary | ICD-10-CM | POA: Insufficient documentation

## 2011-04-27 DIAGNOSIS — F988 Other specified behavioral and emotional disorders with onset usually occurring in childhood and adolescence: Secondary | ICD-10-CM

## 2011-04-27 DIAGNOSIS — Z113 Encounter for screening for infections with a predominantly sexual mode of transmission: Secondary | ICD-10-CM

## 2011-04-27 DIAGNOSIS — J45990 Exercise induced bronchospasm: Secondary | ICD-10-CM

## 2011-04-27 DIAGNOSIS — F17200 Nicotine dependence, unspecified, uncomplicated: Secondary | ICD-10-CM

## 2011-04-27 DIAGNOSIS — R635 Abnormal weight gain: Secondary | ICD-10-CM

## 2011-04-27 DIAGNOSIS — Z79899 Other long term (current) drug therapy: Secondary | ICD-10-CM

## 2011-04-27 DIAGNOSIS — Z309 Encounter for contraceptive management, unspecified: Secondary | ICD-10-CM | POA: Insufficient documentation

## 2011-04-27 LAB — HEPATIC FUNCTION PANEL
ALT: 15 U/L (ref 0–35)
AST: 18 U/L (ref 0–37)
Albumin: 4.3 g/dL (ref 3.5–5.2)
Alkaline Phosphatase: 61 U/L (ref 39–117)
Bilirubin, Direct: 0 mg/dL (ref 0.0–0.3)
Total Bilirubin: 0.1 mg/dL — ABNORMAL LOW (ref 0.3–1.2)
Total Protein: 7.5 g/dL (ref 6.0–8.3)

## 2011-04-27 LAB — CBC WITH DIFFERENTIAL/PLATELET
Basophils Absolute: 0 10*3/uL (ref 0.0–0.1)
Basophils Relative: 0.3 % (ref 0.0–3.0)
Eosinophils Absolute: 0 10*3/uL (ref 0.0–0.7)
Eosinophils Relative: 0.4 % (ref 0.0–5.0)
HCT: 43.1 % (ref 36.0–46.0)
Hemoglobin: 14.5 g/dL (ref 12.0–15.0)
Lymphocytes Relative: 24.7 % (ref 12.0–46.0)
Lymphs Abs: 1.8 10*3/uL (ref 0.7–4.0)
MCHC: 33.7 g/dL (ref 30.0–36.0)
MCV: 86.4 fl (ref 78.0–100.0)
Monocytes Absolute: 0.6 10*3/uL (ref 0.1–1.0)
Monocytes Relative: 8.5 % (ref 3.0–12.0)
Neutro Abs: 4.7 10*3/uL (ref 1.4–7.7)
Neutrophils Relative %: 66.1 % (ref 43.0–77.0)
Platelets: 235 10*3/uL (ref 150.0–400.0)
RBC: 4.99 Mil/uL (ref 3.87–5.11)
RDW: 13.5 % (ref 11.5–14.6)
WBC: 7.2 10*3/uL (ref 4.5–10.5)

## 2011-04-27 LAB — BASIC METABOLIC PANEL
BUN: 14 mg/dL (ref 6–23)
CO2: 26 mEq/L (ref 19–32)
Calcium: 9.6 mg/dL (ref 8.4–10.5)
Chloride: 101 mEq/L (ref 96–112)
Creatinine, Ser: 0.6 mg/dL (ref 0.4–1.2)
GFR: 142.57 mL/min (ref >60.00)
Glucose, Bld: 83 mg/dL (ref 70–99)
Potassium: 4.3 mEq/L (ref 3.5–5.1)
Sodium: 137 mEq/L (ref 135–145)

## 2011-04-27 LAB — LIPID PANEL
Cholesterol: 205 mg/dL — ABNORMAL HIGH (ref 0–200)
HDL: 56.1 mg/dL (ref >39.00)
Total CHOL/HDL Ratio: 4
Triglycerides: 247 mg/dL — ABNORMAL HIGH (ref 0.0–149.0)
VLDL: 49.4 mg/dL — ABNORMAL HIGH (ref 0.0–40.0)

## 2011-04-27 LAB — LDL CHOLESTEROL, DIRECT: Direct LDL: 116 mg/dL

## 2011-04-27 LAB — TSH: TSH: 0.78 u[IU]/mL (ref 0.35–5.50)

## 2011-04-27 MED ORDER — ETHYNODIOL DIAC-ETH ESTRADIOL 1-35 MG-MCG PO TABS: 1.0000 | ORAL_TABLET | Freq: Every day | ORAL | Status: DC

## 2011-04-27 NOTE — Patient Instructions (Addendum)
Food awareness   Decrease portion size can help  With weight control Discontinue sugar drinks . Empty calories and may increase appetite. Increase exercise .  3500 calories is the energy content of a pound of body weight .Must have a 3500 cal deficit to lose one pound . Thus decrease 500 calorie equivalent per day in food or drink intake / or exercise  for 7 days to lose one pound.   Stop smoking  for our health.  Will notify you  of labs when available.

## 2011-04-27 NOTE — Progress Notes (Signed)
Subjective:    Patient ID: Alexis [name], female    DOB: 01/11/1991, 20 y.o.   MRN: [id number]  HPI Patient comes in today for preventive visit and follow-up of medical issues. Update  history since  last visit:  Back At [facility]  And doing better with counselor and add coach.   Dr Dub Mikes in town here  Is her psych  Major history.   Sees Counselor on Tuesday.   Asthma stable  Ok prn inhaler .  ocass tobacco trying to stop   Sleep  Regular . Nap in day.    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 GI /GU: No adominal pain, vomiting, change in bowel habits. No blood in the stool. No significant GU symptoms. SKIN/HEME: ,no acute skin rashes suspicious lesions or bleeding. No lymphadenopathy, nodules, masses.  NEURO/ PSYCH:  No neurologic signs such as weakness numbness. No depression anxiety. IMM/ Allergy: No unusual infections.  Allergy .   REST of 12 system review negative except as per HPI  Past history family history social history reviewed in the electronic medical record.      Objective:   Physical Exam  Wt Readings from Last 3 Encounters:  04/27/11 144 lb (65.318 kg)  05/11/10 122 lb (55.339 kg) (37.90%*)  03/03/10 114 lb (51.71 kg) (22.41%*)   * Growth percentiles are based on CDC 2-20 Years data.  Physical Exam: Vital signs reviewed WUJ:WJXB is a well-developed well-nourished alert cooperative  white female who appears her stated age in no acute distress.  HEENT: normocephalic atraumatic , Eyes: PERRL EOM's full, conjunctiva clear, Nares: paten,t no deformity discharge or tenderness., Ears: no deformity EAC's clear TMs with normal landmarks. Mouth: clear OP, no lesions, edema.  Moist mucous  membranes. Dentition in adequate repair. NECK: supple without masses, thyromegaly or bruits. CHEST/PULM:  Clear to auscultation and percussion breath sounds equal no wheeze , rales or rhonchi. No chest wall deformities or tenderness. CV: PMI is nondisplaced, S1 S2 no gallops, murmurs, rubs. Peripheral pulses are full without delay.No JVD .  Breast: normal by inspection . No dimpling, discharge, masses, tenderness or discharge . Lumpiness superiorly but no acute masses  ABDOMEN: Bowel sounds normal nontender  No guard or rebound, no hepato splenomegal no CVA tenderness.  No hernia. Extremtities:  No clubbing cyanosis or edema, no acute joint swelling or redness no focal atrophy NEURO:  Oriented x3, cranial nerves 3-12 appear to be intact, no obvious focal weakness,gait within normal limits no abnormal reflexes or asymmetrical SKIN: No acute rashes normal turgor, color, no bruising or petechiae. Faded pigment areas lower extremities  no acute rashes some dry skin PSYCH: Oriented, good eye contact, no obvious depression anxiety, cognition and judgment appear normal. Speech is normal. LN: no cervical axillary inguinal adenopathy         Assessment & Plan:  Preventive Health Care Counseled regarding healthy nutrition, exercise, sleep, injury prevention, calcium vit d and healthy weight . utd on immuniz but didn't get flu shot this year   OCPs  Stable  not contributing to weight gain   Mood /adhd   under psychiatric care some high risk medications can be associated with weight gain needs to  take action to counter act this  Skin  has some picking behavior but no acute disease now use lotion moisturizer and avoid picking and see dermatologist if persistent Weight increase over the last year related to multiple factors discussed lifestyle interventions.  High risk meds  Tobacco  Advise stop Get labs today( had skim milk    To intere[eret tests )

## 2011-04-28 LAB — RPR

## 2011-04-28 LAB — HIV ANTIBODY (ROUTINE TESTING W REFLEX): HIV: NONREACTIVE

## 2011-04-28 LAB — CHLAMYDIA PROBE AMPLIFICATION, URINE: Chlamydia, Swab/Urine, PCR: NEGATIVE

## 2011-04-29 ENCOUNTER — Encounter: Payer: Self-pay | Admitting: *Deleted

## 2011-04-29 NOTE — Progress Notes (Signed)
Quick Note:    Letter sent to pt.  ______

## 2011-07-24 ENCOUNTER — Other Ambulatory Visit: Payer: Self-pay | Admitting: Internal Medicine

## 2011-10-28 ENCOUNTER — Telehealth: Payer: Self-pay | Admitting: Family Medicine

## 2011-10-28 NOTE — Telephone Encounter (Signed)
[facility] Triage Call Report Triage Record Num: [id number] Operator: Tomasita Crumble Patient Name: Alexis [name] Call Date & Time: 10/27/2011 5:11:26PM Patient Phone: [phone] PCP: Patient Gender: Female PCP Fax : Patient DOB: 1990/03/06 Practice Name: [facility] Reason for Call: Caller: Laura/Mother; PCP: Berniece Andreas (Family Practice); CB#: [phone]; Call regarding Birth Control questions/ symptoms. LMP "last week" Increased depression and irritability during the week of her period, difficulty completing tasks. Caller asks if patient is on the best dose of medication / hormone or if there is another contraceptive that would give her less side effects such as implantable birth control device or taking the pills constantly and avoiding having periods. Emergent sx ruled out. See provider in 24 hours per Contraception: Hormonal protocol. Advised follow up with office when they are open to discuss with provider. Caller agreed. Protocol(s) Used: Contraception: Hormonal Recommended Outcome per Protocol: See Provider within 24 hours Reason for Outcome: Emotional changes that interfere with ability to carry out normal activities Care Advice: ~ Consider relaxation, meditation, and deep breathing techniques. ~ Call provider if symptoms worsen or new symptoms develop. ~ SYMPTOM / CONDITION MANAGEMENT ~ Avoid or limit use of caffeine, alcohol and non-prescription drugs. 09/

## 2011-11-03 ENCOUNTER — Telehealth: Payer: Self-pay | Admitting: Internal Medicine

## 2011-11-03 NOTE — Telephone Encounter (Signed)
Spoke to Mom and she informed me that during the placebo week of her BC pack, Alexis [name] is not doing well.  She has trouble "getting up and going" during that week.  Depression seems to be worse.  She has a bad temperament.  She does not follow through with daily hygiene.  Most of the time she does not go to class.  She is not suicidal.  Mom would like to know if she could skip the placebo week.  If not than find a different form of BC where the hormones will remain more constant.  Please read the message below also and advise.  Thanks!!!

## 2011-11-03 NOTE — Telephone Encounter (Signed)
Caller: Laura/Mother; Patient Name: Alexis [name]; PCP: Berniece Andreas [facility] Best Callback Phone Number: [phone].  Call regarding no follow up from last week's call.  Mom says no changes.  Mom called psychiatrist and has an appt Oct.. Mom wants to know if the BCP can be changed to help with hormone levels.  Mom is willing to scheduled an appt with Provider.  Pt is away at college and won't be back until Oct.   All emergent symtpoms ruled out per Contraception: Hormonal protocol with exception to 'Emotional changes that interfere with ability to carry out normal activites'.  See Provider in 24 hrs.  PLEASE FOLLOW UP WITH MOM REGARDING PT'S SYMPTOMS FROM BCP.  Thank you.

## 2011-11-04 NOTE — Telephone Encounter (Signed)
We can change to seasonale disp 84 and take continuously  Or  can take the current one continuously .  Advise either of above  and rov in 1 month  There are other options also . Agree she needs to see her psychiatrist in the meantime

## 2011-11-05 ENCOUNTER — Other Ambulatory Visit: Payer: Self-pay | Admitting: Family Medicine

## 2011-11-05 DIAGNOSIS — N946 Dysmenorrhea, unspecified: Secondary | ICD-10-CM

## 2011-11-05 DIAGNOSIS — J45990 Exercise induced bronchospasm: Secondary | ICD-10-CM

## 2011-11-05 DIAGNOSIS — Z79899 Other long term (current) drug therapy: Secondary | ICD-10-CM

## 2011-11-05 DIAGNOSIS — F988 Other specified behavioral and emotional disorders with onset usually occurring in childhood and adolescence: Secondary | ICD-10-CM

## 2011-11-05 DIAGNOSIS — R635 Abnormal weight gain: Secondary | ICD-10-CM

## 2011-11-05 DIAGNOSIS — Z0000 Encounter for general adult medical examination without abnormal findings: Secondary | ICD-10-CM

## 2011-11-05 DIAGNOSIS — Z113 Encounter for screening for infections with a predominantly sexual mode of transmission: Secondary | ICD-10-CM

## 2011-11-05 DIAGNOSIS — Z720 Tobacco use: Secondary | ICD-10-CM

## 2011-11-05 DIAGNOSIS — Z309 Encounter for contraceptive management, unspecified: Secondary | ICD-10-CM

## 2011-11-05 MED ORDER — ETHYNODIOL DIAC-ETH ESTRADIOL 1-35 MG-MCG PO TABS: ORAL_TABLET | ORAL | Status: DC

## 2011-11-05 NOTE — Telephone Encounter (Signed)
Spoke to Mom and she wants to continue with current BC and skip the last week.  Sent new rx to the pharmacy.

## 2011-12-03 ENCOUNTER — Ambulatory Visit: Payer: BC Managed Care – PPO

## 2012-02-01 ENCOUNTER — Ambulatory Visit (INDEPENDENT_AMBULATORY_CARE_PROVIDER_SITE_OTHER): Payer: BC Managed Care – PPO | Admitting: Family

## 2012-02-01 ENCOUNTER — Other Ambulatory Visit (HOSPITAL_COMMUNITY)
Admission: RE | Admit: 2012-02-01 | Discharge: 2012-02-01 | Disposition: A | Discharge status: Home / Self Care | Payer: BC Managed Care – PPO | Source: Ambulatory Visit | Attending: Family | Admitting: Family

## 2012-02-01 ENCOUNTER — Encounter: Payer: Self-pay | Admitting: Family

## 2012-02-01 VITALS — BP 124/82 | HR 108 | Temp 98.4°F | Wt 153.0 lb

## 2012-02-01 DIAGNOSIS — Z7251 High risk heterosexual behavior: Secondary | ICD-10-CM

## 2012-02-01 DIAGNOSIS — N898 Other specified noninflammatory disorders of vagina: Secondary | ICD-10-CM

## 2012-02-01 DIAGNOSIS — Z113 Encounter for screening for infections with a predominantly sexual mode of transmission: Secondary | ICD-10-CM | POA: Insufficient documentation

## 2012-02-01 MED ORDER — METRONIDAZOLE 500 MG PO TABS: 500.0000 mg | ORAL_TABLET | Freq: Two times a day (BID) | ORAL | Status: DC

## 2012-02-01 NOTE — Patient Instructions (Addendum)
Bacterial Vaginosis Bacterial vaginosis (BV) is a vaginal infection where the normal balance of bacteria in the vagina is disrupted. The normal balance is then replaced by an overgrowth of certain bacteria. There are several different kinds of bacteria that can cause BV. BV is the most common vaginal infection in women of childbearing age. CAUSES   The cause of BV is not fully understood. BV develops when there is an increase or imbalance of harmful bacteria.  Some activities or behaviors can upset the normal balance of bacteria in the vagina and put women at increased risk including:  Having a new sex partner or multiple sex partners.  Douching.  Using an intrauterine device (IUD) for contraception.  It is not clear what role sexual activity plays in the development of BV. However, women that have never had sexual intercourse are rarely infected with BV. Women do not get BV from toilet seats, bedding, swimming pools or from touching objects around them.  SYMPTOMS   Grey vaginal discharge.  A fish-like odor with discharge, especially after sexual intercourse.  Itching or burning of the vagina and vulva.  Burning or pain with urination.  Some women have no signs or symptoms at all. DIAGNOSIS  Your caregiver must examine the vagina for signs of BV. Your caregiver will perform lab tests and look at the sample of vaginal fluid through a microscope. They will look for bacteria and abnormal cells (clue cells), a pH test higher than 4.5, and a positive amine test all associated with BV.  RISKS AND COMPLICATIONS   Pelvic inflammatory disease (PID).  Infections following gynecology surgery.  Developing HIV.  Developing herpes virus. TREATMENT  Sometimes BV will clear up without treatment. However, all women with symptoms of BV should be treated to avoid complications, especially if gynecology surgery is planned. Female partners generally do not need to be treated. However, BV may spread  between female sex partners so treatment is helpful in preventing a recurrence of BV.   BV may be treated with antibiotics. The antibiotics come in either pill or vaginal cream forms. Either can be used with nonpregnant or pregnant women, but the recommended dosages differ. These antibiotics are not harmful to the baby.  BV can recur after treatment. If this happens, a second round of antibiotics will often be prescribed.  Treatment is important for pregnant women. If not treated, BV can cause a premature delivery, especially for a pregnant woman who had a premature birth in the past. All pregnant women who have symptoms of BV should be checked and treated.  For chronic reoccurrence of BV, treatment with a type of prescribed gel vaginally twice a week is helpful. HOME CARE INSTRUCTIONS   Finish all medication as directed by your caregiver.  Do not have sex until treatment is completed.  Tell your sexual partner that you have a vaginal infection. They should see their caregiver and be treated if they have problems, such as a mild rash or itching.  Practice safe sex. Use condoms. Only have 1 sex partner. PREVENTION  Basic prevention steps can help reduce the risk of upsetting the natural balance of bacteria in the vagina and developing BV:  Do not have sexual intercourse (be abstinent).  Do not douche.  Use all of the medicine prescribed for treatment of BV, even if the signs and symptoms go away.  Tell your sex partner if you have BV. That way, they can be treated, if needed, to prevent reoccurrence. SEEK MEDICAL CARE IF:     Your symptoms are not improving after 3 days of treatment.  You have increased discharge, pain, or fever. MAKE SURE YOU:   Understand these instructions.  Will watch your condition.  Will get help right away if you are not doing well or get worse. FOR MORE INFORMATION  Division of STD Prevention (DSTDP), Centers for Disease Control and Prevention:  www.cdc.gov/std American Social Health Association (ASHA): www.ashastd.org  Document Released: 02/01/2005 Document Revised: 04/26/2011 Document Reviewed: 07/25/2008 ExitCare Patient Information 2013 [employer].  

## 2012-02-02 ENCOUNTER — Encounter: Payer: Self-pay | Admitting: Family

## 2012-02-02 ENCOUNTER — Other Ambulatory Visit: Payer: Self-pay | Admitting: Family

## 2012-02-02 DIAGNOSIS — Z202 Contact with and (suspected) exposure to infections with a predominantly sexual mode of transmission: Secondary | ICD-10-CM

## 2012-02-02 LAB — HIV ANTIBODY (ROUTINE TESTING W REFLEX): HIV: NONREACTIVE

## 2012-02-02 LAB — GC/CHLAMYDIA PROBE AMP, GENITAL

## 2012-02-02 LAB — HSV(HERPES SIMPLEX VRS) I + II AB-IGG
HSV 1 Glycoprotein G Ab, IgG: <0.1 IV
HSV 2 Glycoprotein G Ab, IgG: 7.79 IV — ABNORMAL HIGH

## 2012-02-02 NOTE — Progress Notes (Signed)
Subjective:    Patient ID: Alexis [name], female    DOB: 11-17-1990, 21 y.o.   MRN: [id number]  HPI 21 year old white female, patient of Dr. Fabian Sharp is in today with c/o vaginal discharge and odor x 1 year. She is sexually active with 3 female partners in the last 6 months, both protected and unprotected. She is a Consulting civil engineer at [employer]. Describes discharge as white and fishy. Denies any bleeding between her menstrual cycles.   Reports a racing heart rate that comes and goes. She reports "not doing cocaine as much, only twice this semester." Continues to smoke marijuana on a regular basis.    Review of Systems  Constitutional: Negative.   HENT: Negative.   Respiratory: Negative.   Cardiovascular: Negative.   Gastrointestinal: Negative.   Genitourinary: Positive for vaginal discharge. Negative for dysuria, vaginal bleeding and vaginal pain.  Musculoskeletal: Negative.   Hematological: Negative.   Psychiatric/Behavioral: Negative.    Past Medical History  Diagnosis Date  . ALCOHOL USE 07/22/2009  . ATTENTION DEFICIT DISORDER 04/28/2007     hx of speech therapy  . EXERCISE INDUCED ASTHMA 04/28/2007  . MUCOSITIS ULCERATIVE OF CERVIX VAGINA AND VULVA 08/08/2008    herpes  . PRIMARY DYSMENORRHEA 04/28/2007  . ACNE VULGARIS 04/28/2007  . POSTURAL LIGHTHEADEDNESS 04/28/2007  . Rash and other nonspecific skin eruption 07/22/2009  . Dysuria 08/08/2008  . Abdominal pain, generalized 08/08/2008  . History of pyelonephritis     acute right  4 2011  . Alcohol abuse, in remission      hx of amnesia   . HSV (herpes simplex virus) anogenital infection     hx of same    History   Social History  . Marital Status: Single    Spouse Name: N/A    Number of Children: N/A  . Years of Education: N/A   Occupational History  . Not on file.   Social History Main Topics  . Smoking status: Current Every Day Smoker -- 0.3 packs/day    Types: Cigarettes  . Smokeless tobacco: Not on file     Comment: per  pt 4 cigarettes a day   . Alcohol Use: No  . Drug Use: No     Comment: In the past- Pot 3 months ago  . Sexually Active: Yes    Birth Control/ Protection: Pill   Other Topics Concern  . Not on file   Social History Narrative   [employer] history  And english  Dropped out temporarily for a semester getting psych help No ets [employer] In dorm with soroority sister  Getting counseling  Here and there.  Sophomore better grades Avoiding alcohol at this time    No past surgical history on file.  Family History  Problem Relation Age of Onset  . Heart attack Mother   . Asthma    . Diabetes type II      No Known Allergies  Current Outpatient Prescriptions on File Prior to Visit  Medication Sig Dispense Refill  . albuterol (PROVENTIL HFA) 108 (90 BASE) MCG/ACT inhaler Inhale into the lungs. 1-2 puffs every 6 hours prn       . ARIPiprazole (ABILIFY) 5 MG tablet Take 5 mg by mouth daily.      Marland Kitchen buPROPion (WELLBUTRIN XL) 150 MG 24 hr tablet Take 150 mg by mouth daily.      Marland Kitchen ethynodiol-ethinyl estradiol [place] 1/35) 1-35 MG-MCG tablet Take on pill daily.  Skip last week and continue.  84  tablet  4  . lamoTRIgine (LAMICTAL) 100 MG tablet Take 100 mg by mouth daily.      Marland Kitchen lisdexamfetamine (VYVANSE) 70 MG capsule Take 100 mg by mouth every morning.       . MULTIPLE VITAMIN PO Take by mouth.        . sertraline (ZOLOFT) 100 MG tablet Take 100 mg by mouth daily.      . valACYclovir (VALTREX) 1000 MG tablet Take 1,000 mg by mouth daily.        Marland Kitchen L-Methylfolate (DEPLIN) 15 MG TABS Take 15 mg by mouth daily.          BP 124/82  Pulse 108  Temp 98.4 F (36.9 C) (Oral)  Wt 153 lb (69.4 kg)  SpO2 98%chart    Objective:   Physical Exam  Constitutional: She is oriented to person, place, and time. She appears well-developed and well-nourished.  Cardiovascular: Normal rate, regular rhythm and normal heart sounds.   Pulmonary/Chest: Effort normal and breath sounds normal.  Abdominal: Soft. Bowel  sounds are normal. She exhibits no distension. There is no tenderness.  Genitourinary: Uterus normal. Vaginal discharge found.       Frothy, white vaginal discharge. Positive whiff test  Neurological: She is alert and oriented to person, place, and time.  Skin: Skin is warm and dry.  Psychiatric: She has a normal mood and affect.          Assessment & Plan:  Assessment: Vaginal Discharge, High Risk Sexual Behavior, Drug abuse  Plan: Flagyl 500mg  twice a day x 7 days. Safe sex practices. Long discussion on drug use/abuse.

## 2012-02-03 ENCOUNTER — Other Ambulatory Visit: Payer: Self-pay | Admitting: Family

## 2012-02-03 DIAGNOSIS — A6000 Herpesviral infection of urogenital system, unspecified: Secondary | ICD-10-CM

## 2012-02-03 MED ORDER — VALACYCLOVIR HCL 500 MG PO TABS: 500.0000 mg | ORAL_TABLET | Freq: Every day | ORAL | Status: DC

## 2012-07-28 ENCOUNTER — Ambulatory Visit: Payer: BC Managed Care – PPO | Admitting: Internal Medicine

## 2012-07-28 ENCOUNTER — Ambulatory Visit (INDEPENDENT_AMBULATORY_CARE_PROVIDER_SITE_OTHER): Payer: BC Managed Care – PPO | Admitting: Family Medicine

## 2012-07-28 ENCOUNTER — Encounter: Payer: Self-pay | Admitting: Family Medicine

## 2012-07-28 VITALS — BP 120/80 | Temp 98.4°F | Wt 164.0 lb

## 2012-07-28 DIAGNOSIS — R509 Fever, unspecified: Secondary | ICD-10-CM

## 2012-07-28 LAB — POCT URINALYSIS DIPSTICK
Bilirubin, UA: NEGATIVE
Blood, UA: NEGATIVE
Glucose, UA: NEGATIVE
Ketones, UA: NEGATIVE
Leukocytes, UA: NEGATIVE
Nitrite, UA: NEGATIVE
Protein, UA: NEGATIVE
Spec Grav, UA: >=1.03
Urobilinogen, UA: 0.2
pH, UA: 5.5

## 2012-07-28 NOTE — Progress Notes (Signed)
Chief Complaint  Patient presents with  . Urinary Tract Infection    fever    HPI:  ?UTI: -found thermometer today and was playing with it and had a temp of 99, also thinks urine smells a little weird maybe, she is not worried but mother told her to go have urine checked -she had kidney infection 4 years ago - with no symptoms other then back pain with that -denies: back pain, NVD, abd pain, dysuria, vaginal symptoms, change of pregnancy - on birth control and does not have periods -just wanted to check urine to ensure no UTI   ROS: See pertinent positives and negatives per HPI.  Past Medical History  Diagnosis Date  . ALCOHOL USE 07/22/2009  . ATTENTION DEFICIT DISORDER 04/28/2007     hx of speech therapy  . EXERCISE INDUCED ASTHMA 04/28/2007  . MUCOSITIS ULCERATIVE OF CERVIX VAGINA AND VULVA 08/08/2008    herpes  . PRIMARY DYSMENORRHEA 04/28/2007  . ACNE VULGARIS 04/28/2007  . POSTURAL LIGHTHEADEDNESS 04/28/2007  . Rash and other nonspecific skin eruption 07/22/2009  . Dysuria 08/08/2008  . Abdominal pain, generalized 08/08/2008  . History of pyelonephritis     acute right  4 2011  . Alcohol abuse, in remission      hx of amnesia   . HSV (herpes simplex virus) anogenital infection     hx of same    Family History  Problem Relation Age of Onset  . Heart attack Mother   . Asthma    . Diabetes type II      History   Social History  . Marital Status: Single    Spouse Name: N/A    Number of Children: N/A  . Years of Education: N/A   Social History Main Topics  . Smoking status: Current Every Day Smoker -- 0.30 packs/day    Types: Cigarettes  . Smokeless tobacco: None     Comment: per pt 4 cigarettes a day   . Alcohol Use: No  . Drug Use: No     Comment: In the past- Pot 3 months ago  . Sexually Active: Yes    Birth Control/ Protection: Pill   Other Topics Concern  . None   Social History Narrative   [employer] history  And english  Dropped out temporarily for a  semester getting psych help    No ets    [employer]       In dorm with soroority sister  Getting counseling  Here and there.  Sophomore better grades       Avoiding alcohol at this time    Current outpatient prescriptions:albuterol (PROVENTIL HFA) 108 (90 BASE) MCG/ACT inhaler, Inhale into the lungs. 1-2 puffs every 6 hours prn , Disp: , Rfl: ;  aluminum chloride (DRYSOL) 20 % external solution, Apply topically 2 (two) times a week., Disp: , Rfl: ;  ARIPiprazole (ABILIFY) 5 MG tablet, Take 5 mg by mouth daily., Disp: , Rfl: ;  buPROPion (WELLBUTRIN XL) 150 MG 24 hr tablet, Take 150 mg by mouth daily., Disp: , Rfl:  ethynodiol-ethinyl estradiol [place] 1/35) 1-35 MG-MCG tablet, Take on pill daily.  Skip last week and continue., Disp: 84 tablet, Rfl: 4;  L-Methylfolate (DEPLIN) 15 MG TABS, Take 15 mg by mouth daily.  , Disp: , Rfl: ;  lamoTRIgine (LAMICTAL) 100 MG tablet, Take 100 mg by mouth daily., Disp: , Rfl: ;  lisdexamfetamine (VYVANSE) 70 MG capsule, Take 100 mg by mouth every morning. , Disp: , Rfl:  metroNIDAZOLE (FLAGYL) 500 MG tablet, Take 1 tablet (500 mg total) by mouth 2 (two) times daily., Disp: 14 tablet, Rfl: 0;  MULTIPLE VITAMIN PO, Take by mouth.  , Disp: , Rfl: ;  sertraline (ZOLOFT) 100 MG tablet, Take 100 mg by mouth daily., Disp: , Rfl: ;  valACYclovir (VALTREX) 500 MG tablet, Take 1 tablet (500 mg total) by mouth daily., Disp: 30 tablet, Rfl: 5  EXAM:  Filed Vitals:   07/28/12 1104  BP: 120/80  Temp: 98.4 F (36.9 C)    Body mass index is 27.29 kg/(m^2).  GENERAL: vitals reviewed and listed above, alert, oriented, appears well hydrated and in no acute distress  HEENT: atraumatic, conjunttiva clear, no obvious abnormalities on inspection of external nose and ears  NECK: no obvious masses on inspection  LUNGS: clear to auscultation bilaterally, no wheezes, rales or rhonchi, good air movement  CV: HRRR, no peripheral edema  ABD: soft, NTTP, no CVA TTP  MS: moves all  extremities without noticeable abnormality  PSYCH: pleasant and cooperative, no obvious depression or anxiety  ASSESSMENT AND PLAN:  Discussed the following assessment and plan:  Fever, unspecified - Plan: POC Urinalysis Dipstick, CANCELED: POC Urinalysis Dipstick  -benign exam, no real symptoms, afebrile, urine dip normal -advised if develops symptoms to see a doctor -Patient advised to return or notify a doctor immediately if symptoms worsen or persist or new concerns arise.  There are no Patient Instructions on file for this visit.   Kriste Basque R.

## 2012-08-03 ENCOUNTER — Telehealth: Payer: Self-pay | Admitting: Internal Medicine

## 2012-08-03 NOTE — Telephone Encounter (Signed)
Patient Information:  Caller Name: [name]  Phone: [phone]  Patient: Alexis [name], Alexis [name]  Gender: Female  DOB: 1990/08/20  Age: 22 Years  PCP: Berniece Andreas [facility])  Pregnant: No  Office Follow Up:  Does the office need to follow up with this patient?: Yes  Instructions For The Office: See RN notes  RN Note:  [facility] on Friday and was checked for UTI, but test was negative. Contact mother regarding possible refill of Zofran vs Office visit. Reports patient needs refill of Drysol at this time.  Symptoms  Reason For Call & Symptoms: Reports vomiting x 2 days and nausea. Mild diarrhea.  Reviewed Health History In EMR: Yes  Reviewed Medications In EMR: Yes  Reviewed Allergies In EMR: Yes  Reviewed Surgeries / Procedures: Yes  Date of Onset of Symptoms: 08/01/2012  Treatments Tried: Zofran 4mg  ODT, Ibuprofen  Treatments Tried Worked: Yes OB / GYN:  LMP: 07/20/2012  Guideline(s) Used:  Vomiting  Headache  Disposition Per Guideline:   Callback by PCP or Subspecialist within 1 Hour  Reason For Disposition Reached:   Severe headache and vomiting  Advice Given:  N/A  Patient Will Follow Care Advice:  YES

## 2012-08-03 NOTE — Telephone Encounter (Signed)
I did not see Zofran on pt historical med list.  I spoke with Dr Selena Batten and she was not comfortable prescribing Zofran at this time.  I spoke with pt mother and pt had [facility]an [facility] on [address] once before, however is had expired.  She has been taking the expired med.  Instructions given for clear liquids until sx begin to resolve.  Mother voiced her understanding and explained daughter is eating a more bland diet, not being real receptive to clear liquid diet.  Explained I would forward message to Dr Fabian Sharp for med refills.

## 2012-08-04 ENCOUNTER — Telehealth: Payer: Self-pay | Admitting: Internal Medicine

## 2012-08-04 MED ORDER — ALUMINUM CHLORIDE 20 % EX SOLN: Freq: Every day | CUTANEOUS | Status: DC

## 2012-08-04 NOTE — Telephone Encounter (Signed)
Spoke with Mom.  Informed her medication sent to the pharmacy.  Notified to use Saturday clinic if needed.  Call back on Monday to notify us of her status.

## 2012-08-04 NOTE — Telephone Encounter (Signed)
Patient Information:  Caller Name: Mervyn Gay  Phone: [phone]  Patient: Alexis [name], Alexis [name]  Gender: Female  DOB: Feb 23, 1990  Age: 22 Years  PCP: Berniece Andreas [facility])  Pregnant: No  Office Follow Up:  Does the office need to follow up with this patient?: Yes  Instructions For The Office: Mom requesting Rx for nausea.   Symptoms  Reason For Call & Symptoms: Mom states pt has headache, vomiting, nausea.  Reviewed Health History In EMR: Yes  Reviewed Medications In EMR: Yes  Reviewed Allergies In EMR: Yes  Reviewed Surgeries / Procedures: Yes  Date of Onset of Symptoms: 07/31/2012 OB / GYN:  LMP: 07/21/2012  Guideline(s) Used:  Vomiting  Disposition Per Guideline:   See Today in Office  Reason For Disposition Reached:   Vomiting lasts > 48 hours  Advice Given:  Call Back If:  You become worse.  Patient Refused Recommendation:  Patient Requests Prescription  Mom states pt is home from college and mom at work. Pt with no car to get to the office for appt.  Mom requesting Rx for nausea.  PLEASE F/U WITH MOM, THANK YOU.

## 2012-08-04 NOTE — Telephone Encounter (Signed)
Spoke with Mom.  Informed her medication sent to the pharmacy.  Notified to use Saturday clinic if needed.  Call back on Monday to notify us of her status. 

## 2012-08-04 NOTE — Telephone Encounter (Signed)
Ok to rx drysol  X 1  Only but I havent  Seen her in over a year .    OV next week (if she is in town ) Or  Sat clinic if needed . Before then.

## 2012-08-21 ENCOUNTER — Ambulatory Visit (INDEPENDENT_AMBULATORY_CARE_PROVIDER_SITE_OTHER): Payer: BC Managed Care – PPO | Admitting: Family Medicine

## 2012-08-21 VITALS — BP 118/80 | HR 102 | Temp 98.6°F | Resp 16 | Ht 64.0 in | Wt 166.0 lb

## 2012-08-21 DIAGNOSIS — N10 Acute tubulo-interstitial nephritis: Secondary | ICD-10-CM

## 2012-08-21 DIAGNOSIS — R109 Unspecified abdominal pain: Secondary | ICD-10-CM

## 2012-08-21 DIAGNOSIS — M549 Dorsalgia, unspecified: Secondary | ICD-10-CM

## 2012-08-21 DIAGNOSIS — B3731 Acute candidiasis of vulva and vagina: Secondary | ICD-10-CM

## 2012-08-21 DIAGNOSIS — B373 Candidiasis of vulva and vagina: Secondary | ICD-10-CM

## 2012-08-21 LAB — POCT URINALYSIS DIPSTICK
Bilirubin, UA: NEGATIVE
Glucose, UA: NEGATIVE
Ketones, UA: NEGATIVE
Nitrite, UA: NEGATIVE
Protein, UA: NEGATIVE
Spec Grav, UA: 1.02
Urobilinogen, UA: 0.2
pH, UA: 7

## 2012-08-21 LAB — POCT CBC
Granulocyte percent: 82.4 %G — AB (ref 37–80)
HCT, POC: 33.6 % — AB (ref 37.7–47.9)
Hemoglobin: 10.8 g/dL — AB (ref 12.2–16.2)
Lymph, poc: 2 (ref 0.6–3.4)
MCH, POC: 29 pg (ref 27–31.2)
MCHC: 32.1 g/dL (ref 31.8–35.4)
MCV: 90.3 fL (ref 80–97)
MID (cbc): 1.1 — AB (ref 0–0.9)
MPV: 8.3 fL (ref 0–99.8)
POC Granulocyte: 14.4 — AB (ref 2–6.9)
POC LYMPH PERCENT: 11.5 %L (ref 10–50)
POC MID %: 6.1 %M (ref 0–12)
Platelet Count, POC: 310 10*3/uL (ref 142–424)
RBC: 3.72 M/uL — AB (ref 4.04–5.48)
RDW, POC: 13.3 %
WBC: 17.5 10*3/uL — AB (ref 4.6–10.2)

## 2012-08-21 LAB — POCT UA - MICROSCOPIC ONLY
Casts, Ur, LPF, POC: NEGATIVE
Crystals, Ur, HPF, POC: NEGATIVE
Mucus, UA: NEGATIVE
Yeast, UA: NEGATIVE

## 2012-08-21 MED ORDER — CIPROFLOXACIN HCL 500 MG PO TABS: 500.0000 mg | ORAL_TABLET | Freq: Two times a day (BID) | ORAL | Status: DC

## 2012-08-21 MED ORDER — CEFTRIAXONE SODIUM 1 G IJ SOLR
1.0000 g | Freq: Once | INTRAMUSCULAR | Status: AC
  Administered 2012-08-21: 1 g via INTRAMUSCULAR

## 2012-08-21 MED ORDER — HYDROCODONE-ACETAMINOPHEN 5-325 MG PO TABS: 1.0000 | ORAL_TABLET | Freq: Four times a day (QID) | ORAL | Status: DC | PRN

## 2012-08-21 MED ORDER — FLUCONAZOLE 150 MG PO TABS: 150.0000 mg | ORAL_TABLET | Freq: Once | ORAL | Status: DC

## 2012-08-21 NOTE — Patient Instructions (Signed)
You should receive a call or letter about your lab results within the next week to 10 days. Fluids, rest, lortab if needed for pain.  Recheck in 2 days - sooner if worse.  (Return to the clinic or go to the nearest emergency room if any of your symptoms worsen or new symptoms occur). Pyelonephritis, Adult Pyelonephritis is a kidney infection. In general, there are 2 main types of pyelonephritis:  Infections that come on quickly without any warning (acute pyelonephritis).  Infections that persist for a long period of time (chronic pyelonephritis). CAUSES  Two main causes of pyelonephritis are:  Bacteria traveling from the bladder to the kidney. This is a problem especially in pregnant women. The urine in the bladder can become filled with bacteria from multiple causes, including:  Inflammation of the prostate gland (prostatitis).  Sexual intercourse in females.  Bladder infection (cystitis).  Bacteria traveling from the bloodstream to the tissue part of the kidney. Problems that may increase your risk of getting a kidney infection include:  Diabetes.  Kidney stones or bladder stones.  Cancer.  Catheters placed in the bladder.  Other abnormalities of the kidney or ureter. SYMPTOMS   Abdominal pain.  Pain in the side or flank area.  Fever.  Chills.  Upset stomach.  Blood in the urine (dark urine).  Frequent urination.  Strong or persistent urge to urinate.  Burning or stinging when urinating. DIAGNOSIS  Your caregiver may diagnose your kidney infection based on your symptoms. A urine sample may also be taken. TREATMENT  In general, treatment depends on how severe the infection is.   If the infection is mild and caught early, your caregiver may treat you with oral antibiotics and send you home.  If the infection is more severe, the bacteria may have gotten into the bloodstream. This will require intravenous (IV) antibiotics and a hospital stay. Symptoms may  include:  High fever.  Severe flank pain.  Shaking chills.  Even after a hospital stay, your caregiver may require you to be on oral antibiotics for a period of time.  Other treatments may be required depending upon the cause of the infection. HOME CARE INSTRUCTIONS   Take your antibiotics as directed. Finish them even if you start to feel better.  Make an appointment to have your urine checked to make sure the infection is gone.  Drink enough fluids to keep your urine clear or pale yellow.  Take medicines for the bladder if you have urgency and frequency of urination as directed by your caregiver. SEEK IMMEDIATE MEDICAL CARE IF:   You have a fever or persistent symptoms for more than 2-3 days.  You have a fever and your symptoms suddenly get worse.  You are unable to take your antibiotics or fluids.  You develop shaking chills.  You experience extreme weakness or fainting.  There is no improvement after 2 days of treatment. MAKE SURE YOU:  Understand these instructions.  Will watch your condition.  Will get help right away if you are not doing well or get worse. Document Released: 02/01/2005 Document Revised: 08/03/2011 Document Reviewed: 07/08/2010 [facility] Patient Information 2014 [place], [place].

## 2012-08-21 NOTE — Progress Notes (Signed)
Subjective:    Patient ID: Alexis [name], female    DOB: [birth year], 22 y.o.   MRN: [id number]  HPI Alexis [name] is a 22 y.o. female  Lower left back pain - noticed at 4 pm today. NKI, had been playing tug of war with dog, but no known injury. No urinary symptoms. Feels like pain radiating to side a little, but not to groin or legs.  No bowel or bladder incontinence, no saddle anesthesia, no lower extremity weakness.  No dysuria/urgency/hematuria/frequency.  Kidney infection few years ago, no urinary symptoms then.     Past Medical History  Diagnosis Date  . ALCOHOL USE 07/22/2009  . ATTENTION DEFICIT DISORDER 04/28/2007     hx of speech therapy  . EXERCISE INDUCED ASTHMA 04/28/2007  . MUCOSITIS ULCERATIVE OF CERVIX VAGINA AND VULVA 08/08/2008    herpes  . PRIMARY DYSMENORRHEA 04/28/2007  . ACNE VULGARIS 04/28/2007  . POSTURAL LIGHTHEADEDNESS 04/28/2007  . Rash and other nonspecific skin eruption 07/22/2009  . Dysuria 08/08/2008  . Abdominal pain, generalized 08/08/2008  . History of pyelonephritis     acute right  4 2011  . Alcohol abuse, in remission      hx of amnesia   . HSV (herpes simplex virus) anogenital infection     hx of same  . Anxiety   . Depression   . Chronic kidney disease    History reviewed. No pertinent past surgical history. Allergies  Allergen Reactions  . Hornet Venom Anaphylaxis   Prior to Admission medications   Medication Sig Start Date End Date Taking? Authorizing Provider  aluminum chloride (DRYSOL) 20 % external solution Apply topically 2 (two) times a week.   Yes Historical Provider, MD  aluminum chloride (DRYSOL) 20 % external solution Apply topically at bedtime. 08/04/12  Yes Madelin Headings, MD  ethynodiol-ethinyl estradiol [place] 1/35) 1-35 MG-MCG tablet Take on pill daily.  Skip last week and continue. 11/05/11  Yes Madelin Headings, MD  lamoTRIgine (LAMICTAL) 100 MG tablet Take 100 mg by mouth daily.   Yes Historical Provider, MD    lisdexamfetamine (VYVANSE) 70 MG capsule Take 100 mg by mouth every morning.    Yes Historical Provider, MD  MULTIPLE VITAMIN PO Take by mouth.     Yes Historical Provider, MD  sertraline (ZOLOFT) 100 MG tablet Take 100 mg by mouth daily.   Yes Historical Provider, MD  valACYclovir (VALTREX) 500 MG tablet Take 1 tablet (500 mg total) by mouth daily. 02/03/12  Yes Baker Pierini, FNP  albuterol (PROVENTIL HFA) 108 (90 BASE) MCG/ACT inhaler Inhale into the lungs. 1-2 puffs every 6 hours prn     Historical Provider, MD  ARIPiprazole (ABILIFY) 5 MG tablet Take 5 mg by mouth daily.    Historical Provider, MD  buPROPion (WELLBUTRIN XL) 150 MG 24 hr tablet Take 150 mg by mouth daily.    Historical Provider, MD  L-Methylfolate (DEPLIN) 15 MG TABS Take 15 mg by mouth daily.      Historical Provider, MD  metroNIDAZOLE (FLAGYL) 500 MG tablet Take 1 tablet (500 mg total) by mouth 2 (two) times daily. 02/01/12   Baker Pierini, FNP   History   Social History  . Marital Status: Single    Spouse Name: N/A    Number of Children: N/A  . Years of Education: N/A   Occupational History  . Not on file.   Social History Main Topics  . Smoking status: Former Smoker -- 0.30 packs/day  Types: Cigarettes  . Smokeless tobacco: Not on file     Comment: per pt 4 cigarettes a day   . Alcohol Use: Yes  . Drug Use: No     Comment: In the past- Pot 3 months ago  . Sexually Active: Yes    Birth Control/ Protection: Pill   Other Topics Concern  . Not on file   Social History Narrative   [facility] history  And english  Dropped out temporarily for a semester getting psych help    No ets    [employer] 4       In dorm with soroority sister  Getting counseling  Here and there.  Sophomore better grades       Avoiding alcohol at this time    Review of Systems  Constitutional: Negative for fever and chills.  Gastrointestinal: Positive for nausea. Negative for vomiting and abdominal pain.       Lower  abdomen/bladder area  Genitourinary: Positive for flank pain. Negative for dysuria, urgency, frequency, hematuria, decreased urine volume, vaginal bleeding, vaginal discharge, difficulty urinating, vaginal pain, menstrual problem and pelvic pain.  Musculoskeletal: Positive for back pain and arthralgias.  Skin: Negative for rash.       Objective:   Physical Exam  Nursing note and vitals reviewed. Constitutional: She is oriented to person, place, and time. She appears well-developed and well-nourished.  HENT:  Head: Normocephalic and atraumatic.  Cardiovascular: Normal rate, regular rhythm, normal heart sounds and intact distal pulses.   Pulmonary/Chest: Effort normal and breath sounds normal.  Abdominal: Soft. Normal appearance and bowel sounds are normal. She exhibits no distension. There is no tenderness. There is no rebound, no guarding and no CVA tenderness (slight - L flank.).  Musculoskeletal:       Lumbar back: She exhibits tenderness. She exhibits normal range of motion, no bony tenderness, no swelling and no spasm.       Back:  Neurological: She is alert and oriented to person, place, and time.  Skin: Skin is warm and dry. No rash noted.  Psychiatric: She has a normal mood and affect. Her behavior is normal.   Results for orders placed in visit on 08/21/12  POCT UA - MICROSCOPIC ONLY      Result Value Range   WBC, Ur, HPF, POC 25-35     RBC, urine, microscopic 2-4     Bacteria, U Microscopic small     Mucus, UA negative     Epithelial cells, urine per micros 1-3     Crystals, Ur, HPF, POC negative     Casts, Ur, LPF, POC negative     Yeast, UA negative    POCT URINALYSIS DIPSTICK      Result Value Range   Color, UA yellow     Clarity, UA cloudy     Glucose, UA negative     Bilirubin, UA negative     Ketones, UA negative     Spec Grav, UA 1.020     Blood, UA trace     pH, UA 7.0     Protein, UA negative     Urobilinogen, UA 0.2     Nitrite, UA negative      Leukocytes, UA moderate (2+)    POCT CBC      Result Value Range   WBC 17.5 (*) 4.6 - 10.2 K/uL   Lymph, poc 2.0  0.6 - 3.4   POC LYMPH PERCENT 11.5  10 - 50 %L   MID (cbc)  1.1 (*) 0 - 0.9   POC MID % 6.1  0 - 12 %M   POC Granulocyte 14.4 (*) 2 - 6.9   Granulocyte percent 82.4 (*) 37 - 80 %G   RBC 3.72 (*) 4.04 - 5.48 M/uL   Hemoglobin 10.8 (*) 12.2 - 16.2 g/dL   HCT, POC 96.0 (*) 45.4 - 47.9 %   MCV 90.3  80 - 97 fL   MCH, POC 29.0  27 - 31.2 pg   MCHC 32.1  31.8 - 35.4 g/dL   RDW, POC 09.8     Platelet Count, POC 310  142 - 424 K/uL   MPV 8.3  0 - 99.8 fL        Assessment & Plan:  Alexis [name] is a 22 y.o. female Back pain - Plan: POCT UA - Microscopic Only, POCT urinalysis dipstick, POCT CBC, Urine culture  Acute pyelonephritis - Plan: POCT CBC, Urine culture, ciprofloxacin (CIPRO) 500 MG tablet, cefTRIAXone (ROCEPHIN) injection 1 g  Flank pain, acute - Plan: HYDROcodone-acetaminophen (NORCO/VICODIN) 5-325 MG per tablet  Candida vaginitis - Plan: fluconazole (DIFLUCAN) 150 MG tablet  Pyelo likely, but fh of nephrolithiasis. Push fluids, rocephin 1gram given here, and cipro 500mg  tablet given in office. lortab only if needed. Recheck in 48 hours.  Rtc/er precautions.   Hx of candidiasis with abxs - diflucan rx if needed.   Meds ordered this encounter  Medications  . ciprofloxacin (CIPRO) 500 MG tablet    Sig: Take 1 tablet (500 mg total) by mouth 2 (two) times daily.    Dispense:  14 tablet    Refill:  0  . HYDROcodone-acetaminophen (NORCO/VICODIN) 5-325 MG per tablet    Sig: Take 1 tablet by mouth every 6 (six) hours as needed for pain.    Dispense:  15 tablet    Refill:  0  . cefTRIAXone (ROCEPHIN) injection 1 g    Sig:   . fluconazole (DIFLUCAN) 150 MG tablet    Sig: Take 1 tablet (150 mg total) by mouth once.    Dispense:  1 tablet    Refill:  0   Patient Instructions  You should receive a call or letter about your lab results within the next week  to 10 days. Fluids, rest, lortab if needed for pain.  Recheck in 2 days - sooner if worse.  (Return to the clinic or go to the nearest emergency room if any of your symptoms worsen or new symptoms occur). Pyelonephritis, Adult Pyelonephritis is a kidney infection. In general, there are 2 main types of pyelonephritis:  Infections that come on quickly without any warning (acute pyelonephritis).  Infections that persist for a long period of time (chronic pyelonephritis). CAUSES  Two main causes of pyelonephritis are:  Bacteria traveling from the bladder to the kidney. This is a problem especially in pregnant women. The urine in the bladder can become filled with bacteria from multiple causes, including:  Inflammation of the prostate gland (prostatitis).  Sexual intercourse in females.  Bladder infection (cystitis).  Bacteria traveling from the bloodstream to the tissue part of the kidney. Problems that may increase your risk of getting a kidney infection include:  Diabetes.  Kidney stones or bladder stones.  Cancer.  Catheters placed in the bladder.  Other abnormalities of the kidney or ureter. SYMPTOMS   Abdominal pain.  Pain in the side or flank area.  Fever.  Chills.  Upset stomach.  Blood in the urine (dark urine).  Frequent urination.  Strong or persistent urge to urinate.  Burning or stinging when urinating. DIAGNOSIS  Your caregiver may diagnose your kidney infection based on your symptoms. A urine sample may also be taken. TREATMENT  In general, treatment depends on how severe the infection is.   If the infection is mild and caught early, your caregiver may treat you with oral antibiotics and send you home.  If the infection is more severe, the bacteria may have gotten into the bloodstream. This will require intravenous (IV) antibiotics and a hospital stay. Symptoms may include:  High fever.  Severe flank pain.  Shaking chills.  Even after a  hospital stay, your caregiver may require you to be on oral antibiotics for a period of time.  Other treatments may be required depending upon the cause of the infection. HOME CARE INSTRUCTIONS   Take your antibiotics as directed. Finish them even if you start to feel better.  Make an appointment to have your urine checked to make sure the infection is gone.  Drink enough fluids to keep your urine clear or pale yellow.  Take medicines for the bladder if you have urgency and frequency of urination as directed by your caregiver. SEEK IMMEDIATE MEDICAL CARE IF:   You have a fever or persistent symptoms for more than 2-3 days.  You have a fever and your symptoms suddenly get worse.  You are unable to take your antibiotics or fluids.  You develop shaking chills.  You experience extreme weakness or fainting.  There is no improvement after 2 days of treatment. MAKE SURE YOU:  Understand these instructions.  Will watch your condition.  Will get help right away if you are not doing well or get worse. Document Released: 02/01/2005 Document Revised: 08/03/2011 Document Reviewed: 07/08/2010 [facility] Patient Information 2014 [place], [place].

## 2012-08-22 ENCOUNTER — Ambulatory Visit (INDEPENDENT_AMBULATORY_CARE_PROVIDER_SITE_OTHER): Payer: BC Managed Care – PPO | Admitting: Family

## 2012-08-22 ENCOUNTER — Telehealth: Payer: Self-pay | Admitting: Internal Medicine

## 2012-08-22 ENCOUNTER — Encounter: Payer: Self-pay | Admitting: Family

## 2012-08-22 VITALS — BP 100/68 | HR 109 | Wt 165.0 lb

## 2012-08-22 DIAGNOSIS — N10 Acute tubulo-interstitial nephritis: Secondary | ICD-10-CM

## 2012-08-22 DIAGNOSIS — R109 Unspecified abdominal pain: Secondary | ICD-10-CM

## 2012-08-22 MED ORDER — CEFTRIAXONE SODIUM 1 G IJ SOLR: 1.0000 g | INTRAMUSCULAR | Status: DC

## 2012-08-22 MED ORDER — CEFTRIAXONE SODIUM 1 G IJ SOLR
1.0000 g | Freq: Once | INTRAMUSCULAR | Status: AC
  Administered 2012-08-22: 1 g via INTRAMUSCULAR

## 2012-08-22 NOTE — Progress Notes (Signed)
Subjective:    Patient ID: Alexis [name], female    DOB: 09-10-1990, 22 y.o.   MRN: [id number]  HPI  Pt is a 22 year old white female, patient of Dr. Fabian Sharp, who presents to the office after urgent care visit for L sided back pain. Pt diagnosed with pyelonephritis. Pt states she came to PCP this morning because her pain is not effectively being managed by hydrocodone prescription given at urgent care. Pt has filled pain prescription but has not filled antibiotics prescribed from urgent care. She was also given 1 gram of Rocephin.   Review of Systems  Constitutional: Negative.   HENT: Negative.   Eyes: Negative.   Respiratory: Negative.   Cardiovascular: Negative.   Gastrointestinal: Negative.   Endocrine: Negative.   Genitourinary: Negative.   Musculoskeletal: Positive for back pain.  Skin: Negative.   Allergic/Immunologic: Negative.   Neurological: Negative.   Hematological: Negative.   Psychiatric/Behavioral: Negative.    Past Medical History  Diagnosis Date  . ALCOHOL USE 07/22/2009  . ATTENTION DEFICIT DISORDER 04/28/2007     hx of speech therapy  . EXERCISE INDUCED ASTHMA 04/28/2007  . MUCOSITIS ULCERATIVE OF CERVIX VAGINA AND VULVA 08/08/2008    herpes  . PRIMARY DYSMENORRHEA 04/28/2007  . ACNE VULGARIS 04/28/2007  . POSTURAL LIGHTHEADEDNESS 04/28/2007  . Rash and other nonspecific skin eruption 07/22/2009  . Dysuria 08/08/2008  . Abdominal pain, generalized 08/08/2008  . History of pyelonephritis     acute right  4 2011  . Alcohol abuse, in remission      hx of amnesia   . HSV (herpes simplex virus) anogenital infection     hx of same  . Anxiety   . Depression   . Chronic kidney disease     History   Social History  . Marital Status: Single    Spouse Name: N/A    Number of Children: N/A  . Years of Education: N/A   Occupational History  . Not on file.   Social History Main Topics  . Smoking status: Former Smoker -- 0.30 packs/day    Types: Cigarettes   . Smokeless tobacco: Not on file     Comment: per pt 4 cigarettes a day   . Alcohol Use: Yes  . Drug Use: No     Comment: In the past- Pot 3 months ago  . Sexually Active: Yes    Birth Control/ Protection: Pill   Other Topics Concern  . Not on file   Social History Narrative   [facility] history  And english  Dropped out temporarily for a semester getting psych help    No ets    [employer] 4       In dorm with soroority sister  Getting counseling  Here and there.  Sophomore better grades       Avoiding alcohol at this time    History reviewed. No pertinent past surgical history.  Family History  Problem Relation Age of Onset  . Heart attack Mother   . Asthma    . Diabetes type II      Allergies  Allergen Reactions  . Hornet Venom Anaphylaxis    Current Outpatient Prescriptions on File Prior to Visit  Medication Sig Dispense Refill  . albuterol (PROVENTIL HFA) 108 (90 BASE) MCG/ACT inhaler Inhale into the lungs. 1-2 puffs every 6 hours prn       . aluminum chloride (DRYSOL) 20 % external solution Apply topically 2 (two) times a week.      Marland Kitchen  aluminum chloride (DRYSOL) 20 % external solution Apply topically at bedtime.  37.5 mL  0  . ARIPiprazole (ABILIFY) 5 MG tablet Take 5 mg by mouth daily.      Marland Kitchen buPROPion (WELLBUTRIN XL) 150 MG 24 hr tablet Take 150 mg by mouth daily.      . ciprofloxacin (CIPRO) 500 MG tablet Take 1 tablet (500 mg total) by mouth 2 (two) times daily.  14 tablet  0  . ethynodiol-ethinyl estradiol [place] 1/35) 1-35 MG-MCG tablet Take on pill daily.  Skip last week and continue.  84 tablet  4  . fluconazole (DIFLUCAN) 150 MG tablet Take 1 tablet (150 mg total) by mouth once.  1 tablet  0  . HYDROcodone-acetaminophen (NORCO/VICODIN) 5-325 MG per tablet Take 1 tablet by mouth every 6 (six) hours as needed for pain.  15 tablet  0  . L-Methylfolate (DEPLIN) 15 MG TABS Take 15 mg by mouth daily.        Marland Kitchen lamoTRIgine (LAMICTAL) 100 MG tablet Take 100 mg by mouth  daily.      Marland Kitchen lisdexamfetamine (VYVANSE) 70 MG capsule Take 100 mg by mouth every morning.       . MULTIPLE VITAMIN PO Take by mouth.        . sertraline (ZOLOFT) 100 MG tablet Take 100 mg by mouth daily.      . valACYclovir (VALTREX) 500 MG tablet Take 1 tablet (500 mg total) by mouth daily.  30 tablet  5   No current facility-administered medications on file prior to visit.    BP 100/68  Pulse 109  Wt 165 lb (74.844 kg)  BMI 28.31 kg/m2chart    Objective:   Physical Exam  Constitutional: She is oriented to person, place, and time. She appears well-developed and well-nourished.  HENT:  Head: Normocephalic and atraumatic.  Eyes: Conjunctivae are normal. Pupils are equal, round, and reactive to light.  Neck: Normal range of motion.  Cardiovascular: Normal rate, regular rhythm and normal heart sounds.   Pulmonary/Chest: Effort normal and breath sounds normal.  Abdominal: Soft. Bowel sounds are normal.  Musculoskeletal: Normal range of motion.  Neurological: She is alert and oriented to person, place, and time.  Skin: Skin is warm and dry.          Assessment & Plan:  1. Pyelonephritis 2. Flank Pain  Pt given 1gm Rocephin IM in office. She is currently drinking coffee. Pt instructed to fill antibiotic prescription, since control of infection will be the primary way that her pain can be reduced. Pt educated on increasing PO intake of water and cranberry juice and avoiding potential irritants such as caffeine. Pt instructed to follow up with PCP if symptoms worsen or persist, or with any questions or concerns.

## 2012-08-22 NOTE — Patient Instructions (Signed)

## 2012-08-22 NOTE — Telephone Encounter (Signed)
Patient Information:  Caller Name: [name]  Phone: [phone]  Patient: Alexis [name], Alexis [name]  Gender: Female  DOB: 09-28-1990  Age: 22 Years  PCP: Berniece Andreas (Family Practice)  Pregnant: No  Office Follow Up:  Does the office need to follow up with this patient?: No  Instructions For The Office: N/A   Symptoms  Reason For Call & Symptoms: "Acute pain in her back"  Seen in Urgent Care; dx with UTI; received Rocephin injection and instructions to return 08/23/12..  On Oxycontin for pain.  Mom suspects kidney stone due to pain.  Emergent symptoms ruled out.  See Today in Office per nursing judgment and caller request.  Reviewed Health History In EMR: Yes  Reviewed Medications In EMR: Yes  Reviewed Allergies In EMR: Yes  Reviewed Surgeries / Procedures: Yes  Date of Onset of Symptoms: 08/21/2012  Treatments Tried: Norco - temporary ability to sleep/ rest  Treatments Tried Worked: Yes OB / GYN:  LMP: Unknown  Guideline(s) Used:  Back Pain  Disposition Per Guideline:   Home Care  Reason For Disposition Reached:   Back pain  Advice Given:  Reassurance:  Twisting or heavy lifting can cause back pain.  Cold or Heat:  Cold Pack: For pain or swelling, use a cold pack or ice wrapped in a wet cloth. Put it on the sore area for 20 minutes. Repeat 4 times on the first day, then as needed.  Heat Pack: If pain lasts over 2 days, apply heat to the sore area. Use a heat pack, heating pad, or warm wet washcloth. Do this for 10 minutes, then as needed. For widespread stiffness, take a hot bath or hot shower instead. Move the sore area under the warm water.  Sleep:  Sleep on your side with a pillow between your knees. If you sleep on your back, put a pillow under your knees.  Avoid sleeping on your stomach.  Activity  Keep doing your day-to-day activities if it is not too painful. Staying active is better than resting.  Avoid anything that makes your pain worse. Avoid heavy lifting,  twisting, and too much exercise until your back heals.  Call Back If:  Numbness or weakness occur  Bowel/bladder problems occur  You become worse.  RN Overrode Recommendation:  Make Appointment  Caller request to see PCP and incomplete pain relief.  Appointment Scheduled:  08/22/2012 09:45:00 Appointment Scheduled Provider:  Adline Mango [facility])

## 2012-08-24 ENCOUNTER — Telehealth: Payer: Self-pay | Admitting: Internal Medicine

## 2012-08-24 ENCOUNTER — Emergency Department (HOSPITAL_COMMUNITY)
Admission: EM | Admit: 2012-08-24 | Discharge: 2012-08-24 | Disposition: A | Discharge status: Home / Self Care | Payer: BC Managed Care – PPO | Attending: Emergency Medicine | Admitting: Emergency Medicine

## 2012-08-24 ENCOUNTER — Emergency Department (HOSPITAL_COMMUNITY): Payer: BC Managed Care – PPO

## 2012-08-24 ENCOUNTER — Encounter (HOSPITAL_COMMUNITY): Payer: Self-pay | Admitting: Emergency Medicine

## 2012-08-24 ENCOUNTER — Ambulatory Visit: Payer: Self-pay | Admitting: Family Medicine

## 2012-08-24 DIAGNOSIS — A6004 Herpesviral vulvovaginitis: Secondary | ICD-10-CM | POA: Insufficient documentation

## 2012-08-24 DIAGNOSIS — M549 Dorsalgia, unspecified: Secondary | ICD-10-CM | POA: Insufficient documentation

## 2012-08-24 DIAGNOSIS — F988 Other specified behavioral and emotional disorders with onset usually occurring in childhood and adolescence: Secondary | ICD-10-CM | POA: Insufficient documentation

## 2012-08-24 DIAGNOSIS — Z8742 Personal history of other diseases of the female genital tract: Secondary | ICD-10-CM | POA: Insufficient documentation

## 2012-08-24 DIAGNOSIS — Z87891 Personal history of nicotine dependence: Secondary | ICD-10-CM | POA: Insufficient documentation

## 2012-08-24 DIAGNOSIS — F3289 Other specified depressive episodes: Secondary | ICD-10-CM | POA: Insufficient documentation

## 2012-08-24 DIAGNOSIS — J45909 Unspecified asthma, uncomplicated: Secondary | ICD-10-CM | POA: Insufficient documentation

## 2012-08-24 DIAGNOSIS — F329 Major depressive disorder, single episode, unspecified: Secondary | ICD-10-CM | POA: Insufficient documentation

## 2012-08-24 DIAGNOSIS — R112 Nausea with vomiting, unspecified: Secondary | ICD-10-CM | POA: Insufficient documentation

## 2012-08-24 DIAGNOSIS — Z872 Personal history of diseases of the skin and subcutaneous tissue: Secondary | ICD-10-CM | POA: Insufficient documentation

## 2012-08-24 DIAGNOSIS — Z79899 Other long term (current) drug therapy: Secondary | ICD-10-CM | POA: Insufficient documentation

## 2012-08-24 DIAGNOSIS — N189 Chronic kidney disease, unspecified: Secondary | ICD-10-CM | POA: Insufficient documentation

## 2012-08-24 DIAGNOSIS — F411 Generalized anxiety disorder: Secondary | ICD-10-CM | POA: Insufficient documentation

## 2012-08-24 DIAGNOSIS — Z87440 Personal history of urinary (tract) infections: Secondary | ICD-10-CM | POA: Insufficient documentation

## 2012-08-24 DIAGNOSIS — R109 Unspecified abdominal pain: Secondary | ICD-10-CM | POA: Insufficient documentation

## 2012-08-24 DIAGNOSIS — Z8669 Personal history of other diseases of the nervous system and sense organs: Secondary | ICD-10-CM | POA: Insufficient documentation

## 2012-08-24 DIAGNOSIS — N12 Tubulo-interstitial nephritis, not specified as acute or chronic: Secondary | ICD-10-CM | POA: Insufficient documentation

## 2012-08-24 LAB — POCT PREGNANCY, URINE: Preg Test, Ur: NEGATIVE

## 2012-08-24 LAB — URINALYSIS, ROUTINE W REFLEX MICROSCOPIC
Bilirubin Urine: NEGATIVE
Glucose, UA: NEGATIVE mg/dL
Hgb urine dipstick: NEGATIVE
Ketones, ur: NEGATIVE mg/dL
Nitrite: NEGATIVE
Protein, ur: NEGATIVE mg/dL
Specific Gravity, Urine: 1.007 (ref 1.005–1.030)
Urobilinogen, UA: 0.2 mg/dL (ref 0.0–1.0)
pH: 7 (ref 5.0–8.0)

## 2012-08-24 LAB — CBC WITH DIFFERENTIAL/PLATELET
Basophils Absolute: 0 10*3/uL (ref 0.0–0.1)
Basophils Relative: 0 % (ref 0–1)
Eosinophils Absolute: 0.1 10*3/uL (ref 0.0–0.7)
Eosinophils Relative: 1 % (ref 0–5)
HCT: 32.2 % — ABNORMAL LOW (ref 36.0–46.0)
Hemoglobin: 10.9 g/dL — ABNORMAL LOW (ref 12.0–15.0)
Lymphocytes Relative: 20 % (ref 12–46)
Lymphs Abs: 2.1 10*3/uL (ref 0.7–4.0)
MCH: 28.8 pg (ref 26.0–34.0)
MCHC: 33.9 g/dL (ref 30.0–36.0)
MCV: 85 fL (ref 78.0–100.0)
Monocytes Absolute: 0.9 10*3/uL (ref 0.1–1.0)
Monocytes Relative: 8 % (ref 3–12)
Neutro Abs: 7.7 10*3/uL (ref 1.7–7.7)
Neutrophils Relative %: 71 % (ref 43–77)
Platelets: 367 10*3/uL (ref 150–400)
RBC: 3.79 MIL/uL — ABNORMAL LOW (ref 3.87–5.11)
RDW: 12.4 % (ref 11.5–15.5)
WBC: 10.8 10*3/uL — ABNORMAL HIGH (ref 4.0–10.5)

## 2012-08-24 LAB — BASIC METABOLIC PANEL
BUN: 7 mg/dL (ref 6–23)
CO2: 26 mEq/L (ref 19–32)
Calcium: 9.7 mg/dL (ref 8.4–10.5)
Chloride: 100 mEq/L (ref 96–112)
Creatinine, Ser: 0.67 mg/dL (ref 0.50–1.10)
GFR calc Af Amer: >90 mL/min (ref >90)
GFR calc non Af Amer: >90 mL/min (ref >90)
Glucose, Bld: 80 mg/dL (ref 70–99)
Potassium: 4.6 mEq/L (ref 3.5–5.1)
Sodium: 135 mEq/L (ref 135–145)

## 2012-08-24 LAB — URINE CULTURE: Colony Count: >=100000

## 2012-08-24 LAB — URINE MICROSCOPIC-ADD ON

## 2012-08-24 MED ORDER — KETOROLAC TROMETHAMINE 30 MG/ML IJ SOLN
30.0000 mg | Freq: Once | INTRAMUSCULAR | Status: AC
  Administered 2012-08-24: 30 mg via INTRAVENOUS
  Filled 2012-08-24: qty 1

## 2012-08-24 MED ORDER — ONDANSETRON HCL 4 MG PO TABS: 4.0000 mg | ORAL_TABLET | Freq: Four times a day (QID) | ORAL | Status: DC

## 2012-08-24 MED ORDER — SODIUM CHLORIDE 0.9 % IV SOLN
INTRAVENOUS | Status: DC
  Administered 2012-08-24: 125 mL/h via INTRAVENOUS

## 2012-08-24 MED ORDER — ONDANSETRON HCL 4 MG/2ML IJ SOLN
4.0000 mg | Freq: Once | INTRAMUSCULAR | Status: AC
  Administered 2012-08-24: 4 mg via INTRAVENOUS
  Filled 2012-08-24: qty 2

## 2012-08-24 NOTE — Telephone Encounter (Signed)
Patient Information:  Caller Name: [name]  Phone: [phone]  Patient: Alexis [name], Alexis [name]  Gender: Female  DOB: 09/29/1990  Age: 22 Years  PCP: Berniece Andreas [facility])  Pregnant: No  Office Follow Up:  Does the office need to follow up with this patient?: No  Instructions For The Office: N/A  RN Note:  Pt seen on 7-8, diagnosed w/ Pyelonephritis and Flank pain.  Pt is on Cipro, received Rocephin shot at office and is on hydrocodone for pain.  Mom states, pain has not improved, had 1 episode of vomiting on 7-9 w/ continuous nausea. No same day availablity w/ Dr Fabian Sharp or Adline Mango, appt scheduled w/ next available MD.   Symptoms  Reason For Call & Symptoms: F/U Kidney Infection w/ new onset of Vomiting x1  Reviewed Health History In EMR: Yes  Reviewed Medications In EMR: Yes  Reviewed Allergies In EMR: Yes  Reviewed Surgeries / Procedures: Yes  Date of Onset of Symptoms: 08/22/2012  Treatments Tried: Cipro and Hydrocodone  Treatments Tried Worked: No OB / GYN:  LMP: Unknown  Guideline(s) Used:  Urination Pain - Female  Disposition Per Guideline:   Go to Office Now  Reason For Disposition Reached:   Side (flank) or lower back pain present  Advice Given:  N/A  Patient Will Follow Care Advice:  YES  Appointment Scheduled:  08/24/2012 14:15:00 Appointment Scheduled Provider:  Kriste Basque (Family Practice)

## 2012-08-24 NOTE — ED Notes (Signed)
Pt states that on Monday around 4ish is when her back started really hurting.  Pt also c/o nausea. Pt was sent here from PCP.

## 2012-08-24 NOTE — ED Provider Notes (Signed)
History    CSN: [id number] Arrival date & time 08/24/12  1257  First MD Initiated Contact with Patient 08/24/12 1306     Chief Complaint  Patient presents with  . Back Pain  . Nausea   (Consider location/radiation/quality/duration/timing/severity/associated sxs/prior Treatment) HPI Comments: Patient presents to ER with complaints of left flank pain. Patient started having pain in the left flank area 4 days ago. She was seen by her doctor, given a shot for pain and started on Cipro. She required a repeat evaluation the next day for continued pain. She has been experiencing nausea and vomiting associated with the pain. Has not been any fever. Patient still experiencing the pain today, called her doctor and was told to come to the ER to have a CAT scan to rule out kidney stone.  Patient is a 22 y.o. female presenting with back pain.  Back Pain Associated symptoms: no abdominal pain    Past Medical History  Diagnosis Date  . ALCOHOL USE 07/22/2009  . ATTENTION DEFICIT DISORDER 04/28/2007     hx of speech therapy  . EXERCISE INDUCED ASTHMA 04/28/2007  . MUCOSITIS ULCERATIVE OF CERVIX VAGINA AND VULVA 08/08/2008    herpes  . PRIMARY DYSMENORRHEA 04/28/2007  . ACNE VULGARIS 04/28/2007  . POSTURAL LIGHTHEADEDNESS 04/28/2007  . Rash and other nonspecific skin eruption 07/22/2009  . Dysuria 08/08/2008  . Abdominal pain, generalized 08/08/2008  . History of pyelonephritis     acute right  4 2011  . Alcohol abuse, in remission      hx of amnesia   . HSV (herpes simplex virus) anogenital infection     hx of same  . Anxiety   . Depression   . Chronic kidney disease    No past surgical history on file. Family History  Problem Relation Age of Onset  . Heart attack Mother   . Asthma    . Diabetes type II     History  Substance Use Topics  . Smoking status: Former Smoker -- 0.30 packs/day    Types: Cigarettes  . Smokeless tobacco: Not on file     Comment: per pt 4 cigarettes a day   .  Alcohol Use: Yes   OB History   Grav Para Term Preterm Abortions TAB SAB Ect Mult Living                 Review of Systems  Gastrointestinal: Positive for nausea and vomiting. Negative for abdominal pain.  Genitourinary: Positive for flank pain.  Musculoskeletal: Positive for back pain.  All other systems reviewed and are negative.    Allergies  Hornet venom  Home Medications   Current Outpatient Rx  Name  Route  Sig  Dispense  Refill  . albuterol (PROVENTIL HFA) 108 (90 BASE) MCG/ACT inhaler   Inhalation   Inhale into the lungs. 1-2 puffs every 6 hours prn          . aluminum chloride (DRYSOL) 20 % external solution   Topical   Apply topically 2 (two) times a week.         Marland Kitchen aluminum chloride (DRYSOL) 20 % external solution   Topical   Apply topically at bedtime.   37.5 mL   0   . ARIPiprazole (ABILIFY) 5 MG tablet   Oral   Take 5 mg by mouth daily.         Marland Kitchen buPROPion (WELLBUTRIN XL) 150 MG 24 hr tablet   Oral   Take 150 mg by  mouth daily.         . ciprofloxacin (CIPRO) 500 MG tablet   Oral   Take 1 tablet (500 mg total) by mouth 2 (two) times daily.   14 tablet   0   . ethynodiol-ethinyl estradiol [place] 1/35) 1-35 MG-MCG tablet      Take on pill daily.  Skip last week and continue.   84 tablet   4     Pt needs to schedule a follow up appt before next  ...   . fluconazole (DIFLUCAN) 150 MG tablet   Oral   Take 1 tablet (150 mg total) by mouth once.   1 tablet   0   . HYDROcodone-acetaminophen (NORCO/VICODIN) 5-325 MG per tablet   Oral   Take 1 tablet by mouth every 6 (six) hours as needed for pain.   15 tablet   0   . L-Methylfolate (DEPLIN) 15 MG TABS   Oral   Take 15 mg by mouth daily.           Marland Kitchen lamoTRIgine (LAMICTAL) 100 MG tablet   Oral   Take 100 mg by mouth daily.         Marland Kitchen lisdexamfetamine (VYVANSE) 70 MG capsule   Oral   Take 100 mg by mouth every morning.          . MULTIPLE VITAMIN PO   Oral   Take by  mouth.           . sertraline (ZOLOFT) 100 MG tablet   Oral   Take 100 mg by mouth daily.         . valACYclovir (VALTREX) 500 MG tablet   Oral   Take 1 tablet (500 mg total) by mouth daily.   30 tablet   5    BP 129/83  Pulse 90  Temp(Src) 98.4 F (36.9 C) (Oral)  Resp 18  SpO2 98% Physical Exam  Constitutional: She is oriented to person, place, and time. She appears well-developed and well-nourished. No distress.  HENT:  Head: Normocephalic and atraumatic.  Right Ear: Hearing normal.  Left Ear: Hearing normal.  Nose: Nose normal.  Mouth/Throat: Oropharynx is clear and moist and mucous membranes are normal.  Eyes: Conjunctivae and EOM are normal. Pupils are equal, round, and reactive to light.  Neck: Normal range of motion. Neck supple.  Cardiovascular: Regular rhythm, S1 normal and S2 normal.  Exam reveals no gallop and no friction rub.   No murmur heard. Pulmonary/Chest: Effort normal and breath sounds normal. No respiratory distress. She exhibits no tenderness.  Abdominal: Soft. Normal appearance and bowel sounds are normal. There is no hepatosplenomegaly. There is tenderness in the left lower quadrant. There is CVA tenderness. There is no rebound, no guarding, no tenderness at McBurney's point and negative Murphy's sign. No hernia.  Slight tenderness in LLQ, no guarding or rebound  Non-tender RLQ  Mild right CVA tenderness  Musculoskeletal: Normal range of motion.  Neurological: She is alert and oriented to person, place, and time. She has normal strength. No cranial nerve deficit or sensory deficit. Coordination normal. GCS eye subscore is 4. GCS verbal subscore is 5. GCS motor subscore is 6.  Skin: Skin is warm, dry and intact. No rash noted. No cyanosis.  Psychiatric: She has a normal mood and affect. Her speech is normal and behavior is normal. Thought content normal.    ED Course  Procedures (including critical care time) Labs Reviewed  CBC WITH  DIFFERENTIAL - Abnormal; Notable  for the following:    WBC 10.8 (*)    RBC 3.79 (*)    Hemoglobin 10.9 (*)    HCT 32.2 (*)    All other components within normal limits  URINALYSIS, ROUTINE W REFLEX MICROSCOPIC - Abnormal; Notable for the following:    Leukocytes, UA TRACE (*)    All other components within normal limits  BASIC METABOLIC PANEL  URINE MICROSCOPIC-ADD ON  POCT PREGNANCY, URINE   No results found.  Diagnosis: Back pain, likely resolving pyelonephritis  MDM  Patient presents to the ER for evaluation of back pain. Patient was seen by her primary doctor earlier in the week and diagnosed with UTI. She was treated with what sounds like Rocephin for 2 days. She continues to have pain, was referred by her doctor to the ER for evaluation for kidney stone. CT scan does not show any evidence of a stone. The patient's urinalysis from earlier in the week reveals that there is obvious signs of infection then, UA looks much improved today. Culture was not returned at the time of evaluation, but seems to be improving. Continue Cipro, follow up with primary doctor.  Gilda Crease, MD 08/28/12 1919

## 2012-09-05 ENCOUNTER — Ambulatory Visit (INDEPENDENT_AMBULATORY_CARE_PROVIDER_SITE_OTHER): Payer: BC Managed Care – PPO | Admitting: Family

## 2012-09-05 ENCOUNTER — Encounter: Payer: Self-pay | Admitting: Family

## 2012-09-05 VITALS — BP 112/80 | HR 88 | Wt 159.0 lb

## 2012-09-05 DIAGNOSIS — N12 Tubulo-interstitial nephritis, not specified as acute or chronic: Secondary | ICD-10-CM

## 2012-09-05 LAB — POCT URINALYSIS DIPSTICK
Glucose, UA: NEGATIVE
Ketones, UA: NEGATIVE
Leukocytes, UA: NEGATIVE
Nitrite, UA: NEGATIVE
Spec Grav, UA: >=1.03
Urobilinogen, UA: 0.2
pH, UA: 6

## 2012-09-05 NOTE — Progress Notes (Signed)
Subjective:    Patient ID: Alexis [name], female    DOB: [birth year], 22 y.o.   MRN: [id number]  HPI  Pt is a 22 year old white female who presents to PCP for follow up on pyelonephritis. Pt reports completing course of antibiotics. Currently denies any pain. Denies any current urinary symptoms, vaginal discharge, abdominal or back pain. Reports no acute issues at this time.   Review of Systems  Constitutional: Negative.   HENT: Negative.   Eyes: Negative.   Respiratory: Negative.   Cardiovascular: Negative.   Gastrointestinal: Negative.   Endocrine: Negative.   Genitourinary: Negative.   Musculoskeletal: Negative.   Skin: Negative.   Allergic/Immunologic: Negative.   Neurological: Negative.   Hematological: Negative.   Psychiatric/Behavioral: Negative.    Past Medical History  Diagnosis Date  . ALCOHOL USE 07/22/2009  . ATTENTION DEFICIT DISORDER 04/28/2007     hx of speech therapy  . EXERCISE INDUCED ASTHMA 04/28/2007  . MUCOSITIS ULCERATIVE OF CERVIX VAGINA AND VULVA 08/08/2008    herpes  . PRIMARY DYSMENORRHEA 04/28/2007  . ACNE VULGARIS 04/28/2007  . POSTURAL LIGHTHEADEDNESS 04/28/2007  . Rash and other nonspecific skin eruption 07/22/2009  . Dysuria 08/08/2008  . Abdominal pain, generalized 08/08/2008  . History of pyelonephritis     acute right  4 2011  . Alcohol abuse, in remission      hx of amnesia   . HSV (herpes simplex virus) anogenital infection     hx of same  . Anxiety   . Depression   . Chronic kidney disease     History   Social History  . Marital Status: Single    Spouse Name: N/A    Number of Children: N/A  . Years of Education: N/A   Occupational History  . Not on file.   Social History Main Topics  . Smoking status: Former Smoker -- 0.30 packs/day    Types: Cigarettes  . Smokeless tobacco: Not on file     Comment: per pt 4 cigarettes a day   . Alcohol Use: Yes  . Drug Use: No     Comment: In the past- Pot 3 months ago  . Sexually  Active: Yes    Birth Control/ Protection: Pill   Other Topics Concern  . Not on file   Social History Narrative   [facility] history  And english  Dropped out temporarily for a semester getting psych help    No ets    [employer] 4       In dorm with soroority sister  Getting counseling  Here and there.  Sophomore better grades       Avoiding alcohol at this time    No past surgical history on file.  Family History  Problem Relation Age of Onset  . Heart attack Mother   . Asthma    . Diabetes type II      Allergies  Allergen Reactions  . Hornet Venom Anaphylaxis    Current Outpatient Prescriptions on File Prior to Visit  Medication Sig Dispense Refill  . aluminum chloride (DRYSOL) 20 % external solution Apply topically at bedtime.  37.5 mL  0  . BuPROPion HCl ER, XL, (FORFIVO XL) 450 MG TB24 Take 1 tablet by mouth daily.      . ciprofloxacin (CIPRO) 500 MG tablet Take 1 tablet (500 mg total) by mouth 2 (two) times daily.  14 tablet  0  . ethynodiol-ethinyl estradiol [place] 1/35) 1-35 MG-MCG tablet Take on pill daily.  Skip last week and continue.  84 tablet  4  . HYDROcodone-acetaminophen (NORCO/VICODIN) 5-325 MG per tablet Take 1 tablet by mouth every 6 (six) hours as needed for pain.  15 tablet  0  . lamoTRIgine (LAMICTAL) 100 MG tablet Take 100 mg by mouth at bedtime.       Marland Kitchen lisdexamfetamine (VYVANSE) 50 MG capsule Take 100 mg by mouth every morning.      . nicotine (NICODERM CQ - DOSED IN MG/24 HOURS) 14 mg/24hr patch Place 1 patch onto the skin daily.      . ondansetron (ZOFRAN) 4 MG tablet Take 1 tablet (4 mg total) by mouth every 6 (six) hours.  20 tablet  0  . sertraline (ZOLOFT) 50 MG tablet Take 50 mg by mouth at bedtime.      . valACYclovir (VALTREX) 500 MG tablet Take 500 mg by mouth daily as needed (outbreak.).      Marland Kitchen fluconazole (DIFLUCAN) 150 MG tablet Take 1 tablet (150 mg total) by mouth once.  1 tablet  0   No current facility-administered medications on file  prior to visit.    BP 112/80  Pulse 88  Wt 159 lb (72.122 kg)  BMI 27.28 kg/m2  SpO2 98%chart    Objective:   Physical Exam  Constitutional: She is oriented to person, place, and time. She appears well-developed and well-nourished.  HENT:  Head: Normocephalic and atraumatic.  Eyes: Pupils are equal, round, and reactive to light.  Neck: Normal range of motion.  Cardiovascular: Normal rate and regular rhythm.   Pulmonary/Chest: Effort normal and breath sounds normal.  Abdominal: Soft. Bowel sounds are normal.  Musculoskeletal: Normal range of motion.  Neurological: She is alert and oriented to person, place, and time.  Skin: Skin is warm and dry.          Assessment & Plan:  1. Pyelonephritis  Urinalysis ordered. Pt will be contacted with any abnormal results. Pt instructed to follow up with PCP as needed with any questions/concerns or acute issues.  Note by Davonna Belling, FNP Student

## 2012-11-06 ENCOUNTER — Other Ambulatory Visit: Payer: Self-pay | Admitting: Internal Medicine

## 2012-12-01 ENCOUNTER — Ambulatory Visit (INDEPENDENT_AMBULATORY_CARE_PROVIDER_SITE_OTHER): Payer: BC Managed Care – PPO

## 2012-12-01 DIAGNOSIS — Z23 Encounter for immunization: Secondary | ICD-10-CM

## 2012-12-04 ENCOUNTER — Other Ambulatory Visit: Payer: Self-pay | Admitting: Internal Medicine

## 2012-12-15 ENCOUNTER — Ambulatory Visit (INDEPENDENT_AMBULATORY_CARE_PROVIDER_SITE_OTHER): Payer: BC Managed Care – PPO | Admitting: Internal Medicine

## 2012-12-15 ENCOUNTER — Encounter: Payer: Self-pay | Admitting: Internal Medicine

## 2012-12-15 VITALS — BP 120/70 | HR 118 | Temp 98.2°F | Wt 140.0 lb

## 2012-12-15 DIAGNOSIS — Z309 Encounter for contraceptive management, unspecified: Secondary | ICD-10-CM

## 2012-12-15 DIAGNOSIS — D649 Anemia, unspecified: Secondary | ICD-10-CM

## 2012-12-15 DIAGNOSIS — J45990 Exercise induced bronchospasm: Secondary | ICD-10-CM

## 2012-12-15 DIAGNOSIS — N946 Dysmenorrhea, unspecified: Secondary | ICD-10-CM

## 2012-12-15 LAB — POCT HEMOGLOBIN: Hemoglobin: 14.9 g/dL (ref 12.2–16.2)

## 2012-12-15 MED ORDER — ETHYNODIOL DIAC-ETH ESTRADIOL 1-35 MG-MCG PO TABS: ORAL_TABLET | ORAL | Status: DC

## 2012-12-15 NOTE — Patient Instructions (Addendum)
Refill medication today. See if you can identify  Last pap smear . You may have had this done elsewhere  I will review record also . i fyou have ment had one in the last 1-2 years we should arrange you to have one and FU visit.   Wellness visit   In 6 months

## 2012-12-15 NOTE — Progress Notes (Signed)
Chief Complaint  Patient presents with  . Follow-up    Needs bc pills refilled.    HPI: Patient comes in today for follow up of  multiple medical problems.   Still   Seeing dr Dub Mikes.  Doing better  For psych   Off a semester.  Quit tobacco.fora bout 6 months  No rd now living at home and hopes to go back next semester  Using etoh socially says fewer than 5-7 per week and not binging  Says not overusing anymore   ocps    Was on yaz   ? Why changed   On zovia .  Continuous 3 months for now Down to normal  Still has ocass btb.   No recent sp   "Taking a break from boys"  No abd pain recent uti vag sx . Says she has had pap smear  Uncertain when   ROS: See pertinent positives and negatives per HPI.  Past Medical History  Diagnosis Date  . ALCOHOL USE 07/22/2009  . ATTENTION DEFICIT DISORDER 04/28/2007     hx of speech therapy  . EXERCISE INDUCED ASTHMA 04/28/2007  . MUCOSITIS ULCERATIVE OF CERVIX VAGINA AND VULVA 08/08/2008    herpes  . PRIMARY DYSMENORRHEA 04/28/2007  . ACNE VULGARIS 04/28/2007  . POSTURAL LIGHTHEADEDNESS 04/28/2007  . Rash and other nonspecific skin eruption 07/22/2009  . Dysuria 08/08/2008  . Abdominal pain, generalized 08/08/2008  . History of pyelonephritis     acute right  4 2011  . Alcohol abuse, in remission      hx of amnesia   . HSV (herpes simplex virus) anogenital infection     hx of same  . Anxiety   . Depression   . Chronic kidney disease     Family History  Problem Relation Age of Onset  . Heart attack Mother   . Asthma    . Diabetes type II      History   Social History  . Marital Status: Single    Spouse Name: N/A    Number of Children: N/A  . Years of Education: N/A   Social History Main Topics  . Smoking status: Former Smoker -- 0.30 packs/day    Types: Cigarettes  . Smokeless tobacco: None     Comment: per pt 4 cigarettes a day   . Alcohol Use: Yes  . Drug Use: No     Comment: In the past- Pot 3 months ago  . Sexual Activity: Yes     Birth Control/ Protection: Pill   Other Topics Concern  . None   Social History Narrative   [employer] history  And english  Dropped out temporarily for a semester getting psych help    No ets    [employer] 4       Limited etoh   Stopped tobacco    Outpatient Encounter Prescriptions as of 12/15/2012  Medication Sig  . aluminum chloride (DRYSOL) 20 % external solution Apply topically at bedtime.  . BuPROPion HCl ER, XL, (FORFIVO XL) 450 MG TB24 Take 1 tablet by mouth daily.  Marland Kitchen ethynodiol-ethinyl estradiol [place] 1/35) 1-35 MG-MCG tablet Take on pill daily.  Skip last week and continue.  . lamoTRIgine (LAMICTAL) 100 MG tablet Take 100 mg by mouth at bedtime.   Marland Kitchen lisdexamfetamine (VYVANSE) 50 MG capsule Take 100 mg by mouth every morning.  . sertraline (ZOLOFT) 50 MG tablet Take 50 mg by mouth at bedtime.  . valACYclovir (VALTREX) 500 MG tablet Take 500 mg by  mouth daily as needed (outbreak.).  . [DISCONTINUED] ethynodiol-ethinyl estradiol [place] 1/35) 1-35 MG-MCG tablet Take on pill daily.  Skip last week and continue.  . [DISCONTINUED] ciprofloxacin (CIPRO) 500 MG tablet Take 1 tablet (500 mg total) by mouth 2 (two) times daily.  . [DISCONTINUED] fluconazole (DIFLUCAN) 150 MG tablet Take 1 tablet (150 mg total) by mouth once.  . [DISCONTINUED] HYDROcodone-acetaminophen (NORCO/VICODIN) 5-325 MG per tablet Take 1 tablet by mouth every 6 (six) hours as needed for pain.  . [DISCONTINUED] nicotine (NICODERM CQ - DOSED IN MG/24 HOURS) 14 mg/24hr patch Place 1 patch onto the skin daily.  . [DISCONTINUED] ondansetron (ZOFRAN) 4 MG tablet Take 1 tablet (4 mg total) by mouth every 6 (six) hours.    EXAM:  BP 120/70  Pulse 118  Temp(Src) 98.2 F (36.8 C) (Oral)  Wt 140 lb (63.504 kg)  BMI 24.02 kg/m2  SpO2 98%  LMP 12/11/2012  Body mass index is 24.02 kg/(m^2).  GENERAL: vitals reviewed and listed above, alert, oriented, appears well hydrated and in no acute distress  HEENT: atraumatic,  conjunctiva  clear, no obvious abnormalities on inspection of external nose and ears OP : no lesion edema or exudate  NECK: no obvious masses on inspection palpation no adenopahty LUNGS: clear to auscultation bilaterally, no wheezes, rales or rhonchi, good air movement CV: HRRR, no clubbing cyanosis or  peripheral edema nl cap refill  Abdomen:  Sof,t normal bowel sounds without hepatosplenomegaly, no guarding rebound or masses no CVA tenderness MS: moves all extremities without noticeable focal  abnormality PSYCH: pleasant and cooperative, no obvious depression or anxiety  ASSESSMENT AND PLAN:  Discussed the following assessment and plan:  Anemia - better - Plan: POCT hemoglobin  PRIMARY DYSMENORRHEA - Plan: ethynodiol-ethinyl estradiol [place] 1/35) 1-35 MG-MCG tablet  EXERCISE INDUCED ASTHMA - Plan: ethynodiol-ethinyl estradiol [place] 1/35) 1-35 MG-MCG tablet  Contraception management - Plan: ethynodiol-ethinyl estradiol [place] 1/35) 1-35 MG-MCG tablet Advised to remain tobacco free  -Patient advised to return or notify health care team  if symptoms worsen or persist or new concerns arise.  Patient Instructions  Refill medication today. See if you can identify  Last pap smear . You may have had this done elsewhere  I will review record also . i fyou have ment had one in the last 1-2 years we should arrange you to have one and FU visit.   Wellness visit   In 6 months    Neta Mends. Juanette Urizar M.D.  Last pap in record was 2009 and was normal  May have been done at [facility] but we dont see in record should update this at her next PV cpx.

## 2012-12-16 ENCOUNTER — Encounter: Payer: Self-pay | Admitting: Internal Medicine

## 2012-12-16 DIAGNOSIS — D649 Anemia, unspecified: Secondary | ICD-10-CM | POA: Insufficient documentation

## 2013-01-16 ENCOUNTER — Encounter (HOSPITAL_COMMUNITY): Payer: Self-pay

## 2013-01-16 ENCOUNTER — Other Ambulatory Visit (HOSPITAL_COMMUNITY): Payer: BC Managed Care – PPO | Admitting: Psychiatry

## 2013-01-16 DIAGNOSIS — F32A Depression, unspecified: Secondary | ICD-10-CM | POA: Insufficient documentation

## 2013-01-16 DIAGNOSIS — F3289 Other specified depressive episodes: Secondary | ICD-10-CM | POA: Insufficient documentation

## 2013-01-16 DIAGNOSIS — F329 Major depressive disorder, single episode, unspecified: Secondary | ICD-10-CM

## 2013-01-16 DIAGNOSIS — F3162 Bipolar disorder, current episode mixed, moderate: Secondary | ICD-10-CM
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
## 2013-01-16 NOTE — Progress Notes (Signed)
    Daily Group Progress Note  Program: IOP  Group Time: 9:00-10:30 am   Participation Level: Active  Behavioral Response: Appropriate  Type of Therapy:  Process Group  Summary of Progress: Pt was completing the initial assessment and orientation with the case manager on Pts first day in the group program.       Group Time: 10:30 am - 12:00 pm   Participation Level:  Active  Behavioral Response: Appropriate  Type of Therapy: Psycho-education Group  Summary of Progress: Pt was completing the initial assessment and orientation with the case manager on Pts first day in the group program.  Pt was completing the initial assessment and orientation with the psychiatrist on Pts first day in the group program.   Maxcine Ham E, LCSW

## 2013-01-17 ENCOUNTER — Other Ambulatory Visit (HOSPITAL_COMMUNITY): Payer: BC Managed Care – PPO | Admitting: Psychiatry

## 2013-01-17 DIAGNOSIS — F32A Depression, unspecified: Secondary | ICD-10-CM

## 2013-01-17 DIAGNOSIS — F329 Major depressive disorder, single episode, unspecified: Secondary | ICD-10-CM

## 2013-01-17 NOTE — Progress Notes (Signed)
    Daily Group Progress Note  Program: IOP  Group Time: 9:00-10:30 am   Participation Level: Active  Behavioral Response: Appropriate  Type of Therapy:  Process Group  Summary of Progress: Today was Pts first day in the group. Pt appeared inattentive at the start of the group, but as she observed the group process became more engaged. Pt shared that she has had a long history with depression. Pt also shared a time when she was 22 years old where she could not find her mother in the house for a few minutes and so she believed she had died. Pt said she was in a panic state and went to the neighbors house to tell them about her mothers death. Pt said her mother was in the shower the whole time. Pt appears to be very close to her parents.      Group Time: 10:30 am - 12:00 pm   Participation Level:  Active  Behavioral Response: Appropriate  Type of Therapy: Psycho-education Group  Summary of Progress: Pt reviewed the personal bill of rights and discussed how they apply to wellness and need to be accepted in order to begin the healing process. Pt identified the statements that they most related to and why it pertained to their current situation in life.    Carman Ching, LCSW

## 2013-01-18 ENCOUNTER — Other Ambulatory Visit (HOSPITAL_COMMUNITY): Payer: BC Managed Care – PPO | Admitting: Psychiatry

## 2013-01-18 DIAGNOSIS — F329 Major depressive disorder, single episode, unspecified: Secondary | ICD-10-CM

## 2013-01-18 DIAGNOSIS — F32A Depression, unspecified: Secondary | ICD-10-CM

## 2013-01-19 ENCOUNTER — Other Ambulatory Visit (HOSPITAL_COMMUNITY): Payer: BC Managed Care – PPO | Admitting: Psychiatry

## 2013-01-19 DIAGNOSIS — F329 Major depressive disorder, single episode, unspecified: Secondary | ICD-10-CM

## 2013-01-19 DIAGNOSIS — F32A Depression, unspecified: Secondary | ICD-10-CM

## 2013-01-19 NOTE — Progress Notes (Signed)
    Daily Group Progress Note  Program: IOP  Group Time: 9:00-10:30 am   Participation Level: Active  Behavioral Response: Appropriate  Type of Therapy:  Process Group  Summary of Progress: Pt was tearful when talking about her difficult year at college that involved a lot of heavy drinking to the point of blacking out and drug use including cocaine use. Pt said she does not feel comfortable expressing her emotions in front of others and expressed anxiety associated with sharing. Pt said she has low self esteem and followed what some stronger personalities at school told her to do. Pt said she "lost herself" while she was at college and hopes to heal herself enough to return and be healthier.      Group Time: 10:30 am - 12:00 pm   Participation Level:  Active  Behavioral Response: Appropriate  Type of Therapy: Psycho-education Group  Summary of Progress: Pt learned about self esteem and the origins and how it shapes the overall sense of self worth today.  Carman Ching, LCSW

## 2013-01-19 NOTE — Progress Notes (Signed)
    Daily Group Progress Note  Program: IOP  Group Time: 9:00-10:30 am   Participation Level: Active  Behavioral Response: Appropriate  Type of Therapy:  Process Group  Summary of Progress: Pt was interactive and engaged. She demonstrates some ADHD symptoms during group, but is easily redirectable. Pt said she "did not want to talk about herself today" after she said she felt like she shared a lot yesterday. Pt was engaged in supporting others and relating to their stories. Pt is working on improving her self-confidence and self-esteem and learning how to have a voice of her own so that when she goes back to college she does not fall back into a peer pressure situation. This is what Pt said she wants to work on during her time in the group.      Group Time: 10:30 am - 12:00 pm   Participation Level:  Active  Behavioral Response: Appropriate  Type of Therapy: Psycho-education Group  Summary of Progress: Pt did an activity to emphasize how negative self talk contributes to low self esteem and depression and then did a positive qualities record.   Carman Ching, LCSW

## 2013-01-22 ENCOUNTER — Other Ambulatory Visit (HOSPITAL_COMMUNITY): Payer: BC Managed Care – PPO | Admitting: Psychiatry

## 2013-01-22 ENCOUNTER — Telehealth (HOSPITAL_COMMUNITY): Payer: Self-pay | Admitting: Psychiatry

## 2013-01-22 NOTE — Progress Notes (Signed)
    Daily Group Progress Note  Program: IOP  Group Time: 9:00-10:30 am   Participation Level: None  Behavioral Response: none  Type of Therapy:  Process Group  Summary of Progress: Pt was not present for this part of group.      Group Time: 10:30 am - 12:00 pm   Participation Level:  Active  Behavioral Response: Appropriate  Type of Therapy: Psycho-education Group  Summary of Progress: Pt participated in a group with a focus on grief and loss, identified current losses and effective grieving strategies.   Carman Ching, LCSW

## 2013-01-23 ENCOUNTER — Other Ambulatory Visit (HOSPITAL_COMMUNITY): Payer: BC Managed Care – PPO | Admitting: Psychiatry

## 2013-01-23 DIAGNOSIS — F329 Major depressive disorder, single episode, unspecified: Secondary | ICD-10-CM

## 2013-01-23 DIAGNOSIS — F32A Depression, unspecified: Secondary | ICD-10-CM
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
## 2013-01-24 ENCOUNTER — Other Ambulatory Visit (HOSPITAL_COMMUNITY): Payer: BC Managed Care – PPO | Admitting: Psychiatry

## 2013-01-24 DIAGNOSIS — F329 Major depressive disorder, single episode, unspecified: Secondary | ICD-10-CM

## 2013-01-24 DIAGNOSIS — F32A Depression, unspecified: Secondary | ICD-10-CM

## 2013-01-24 NOTE — Progress Notes (Signed)
Patient ID: Alexis [name], female   DOB: Jul 02, 1990, 22 y.o.   MRN: [id number] Patient reviewed and interviewed today, states that she likes the groups and has been more open and talking more about her issues. Patient denies using chemicals. States that she still wants to smoke cigarettes and she sees other people smoking but has not used that. Patient is looking at moving to [place] and getting a citizenship fair since her grandmother is from [place] Patient wants to work there. Patient denies suicidal or homicidal ideation and has no hallucinations or delusions. Tolerating her medications well.

## 2013-01-24 NOTE — Progress Notes (Signed)
    Daily Group Progress Note  Program: IOP  Group Time: 9:00-10:30 am   Participation Level: Active  Behavioral Response: Appropriate  Type of Therapy:  Process Group  Summary of Progress: pt arrived thirty minutes late to group again today. Pt has made good progress on her goal of depression and appears to be struggling more with her symptoms of ADHD within the group setting and with low self-esteem that is causing Pt to get into situations with others that cause stress and chaos in her life. Pt is starting to gain insight into how others are able to coerce and guilt her into doing things she is not comfortable with and starting to work on defining what limits she needs to start setting for herself to be healthier.      Group Time: 10:30 am - 12:00 pm   Participation Level:  Active  Behavioral Response: Appropriate  Type of Therapy: Psycho-education Group  Summary of Progress: Pt was introduced to Boundaries as a way to ensure wellness and reduce depression. Pt learned about the different personality styles that impact effective boundary setting.   Carman Ching, LCSW

## 2013-01-25 ENCOUNTER — Other Ambulatory Visit (HOSPITAL_COMMUNITY): Payer: BC Managed Care – PPO | Admitting: Psychiatry

## 2013-01-25 DIAGNOSIS — F329 Major depressive disorder, single episode, unspecified: Secondary | ICD-10-CM

## 2013-01-25 DIAGNOSIS — F32A Depression, unspecified: Secondary | ICD-10-CM

## 2013-01-25 NOTE — Progress Notes (Signed)
    Daily Group Progress Note  Program: IOP  Group Time: 9:00-10:30 am   Participation Level: Active  Behavioral Response: Appropriate  Type of Therapy:  Process Group  Summary of Progress: Pt arrived at 9:30 again today and interrupted a goodbye ceremony that was in progress. Pt did not realize the impact it had on the group and the facial expressions from other members. Pt lacks insight into her behavior and how it affects others. Pt used her phone to send text messages during group. Pt did say she is enjoying the group and finding it helpful in reducing her depression symptoms. Pt said she has started reading books again, which is something she does when she is well. Pt said she has not read in a long time.  Pt is still working on increasing her self-esteem and using friends and family to set boundaries for her due to struggling to do it for herself.      Group Time: 10:30 am - 12:00 pm   Participation Level:  Active  Behavioral Response: Appropriate  Type of Therapy: Psycho-education Group  Summary of Progress: Pt learned about the different types of boundaries that can be set with others to ensure wellness and picked up from the introduction to boundaries that was covered yesterday.   Carman Ching, LCSW

## 2013-01-26 ENCOUNTER — Other Ambulatory Visit (HOSPITAL_COMMUNITY): Payer: BC Managed Care – PPO | Admitting: Psychiatry

## 2013-01-26 DIAGNOSIS — F32A Depression, unspecified: Secondary | ICD-10-CM

## 2013-01-26 DIAGNOSIS — F329 Major depressive disorder, single episode, unspecified: Secondary | ICD-10-CM

## 2013-01-26 NOTE — Progress Notes (Signed)
    Daily Group Progress Note  Program: IOP  Group Time: 9:00-10:30 am   Participation Level: Active  Behavioral Response: Appropriate  Type of Therapy:  Process Group  Summary of Progress:  Pt worked on goal of depression. Pt arrived thirty minutes late again to group without apology. Pt talked about sadness over being behind her peers in her graduation date due to problems she had last semester. Pt became very angry when talking about past relationships with boyfriends and bad decisions she feels she has made.      Group Time: 10:30 am - 12:00 pm   Participation Level:  Active  Behavioral Response: Appropriate  Type of Therapy: Psycho-education Group  Summary of Progress: Pt finished learning about healthy boundary setting and learned how to set limits with people who don't respect boundaries that are set by having consequences in place to support the limits.  Carman Ching, LCSW

## 2013-01-29 ENCOUNTER — Other Ambulatory Visit (HOSPITAL_COMMUNITY): Payer: BC Managed Care – PPO | Admitting: Psychiatry

## 2013-01-29 DIAGNOSIS — F329 Major depressive disorder, single episode, unspecified: Secondary | ICD-10-CM

## 2013-01-29 DIAGNOSIS — F32A Depression, unspecified: Secondary | ICD-10-CM
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
## 2013-01-30 ENCOUNTER — Other Ambulatory Visit (HOSPITAL_COMMUNITY): Payer: BC Managed Care – PPO | Admitting: Psychiatry

## 2013-01-30 DIAGNOSIS — F32A Depression, unspecified: Secondary | ICD-10-CM

## 2013-01-30 DIAGNOSIS — F329 Major depressive disorder, single episode, unspecified: Secondary | ICD-10-CM

## 2013-01-30 NOTE — Progress Notes (Signed)
    Daily Group Progress Note  Program: IOP  Group Time: 9:00-10:30 am   Participation Level: Active  Behavioral Response: Appropriate  Type of Therapy:  Process Group  Summary of Progress: Pt arrived at 9:30 again today. Pt continues to get loser with her behavior regarding the group structure. Pt took phone calls during group and left group early to take her brother to an appointment he had. Pt lacks insight into how her behavior is pushing group members away from her. Pt was encouraged to come on time and members expressed dissatisfaction with Pts lack of respect for the group structure. Pt appeared angry at their comments. Pt appears to struggle when limits are set for her and she becomes angry with them instead of looking at how her behavior contributes to the situation. Pt is uncertain about if she will return to college at [employer] and this is currently her main stressor. She said she has not made progress with making any decisions about this.      Group Time: 10:30 am - 12:00 pm   Participation Level:  Active  Behavioral Response: Appropriate  Type of Therapy: Psycho-education Group  Summary of Progress: pt learned about the medical criteria for depression and how to recognize the symptoms of depression to manage and avoid relapses.   Carman Ching, LCSW

## 2013-01-31 ENCOUNTER — Other Ambulatory Visit (HOSPITAL_COMMUNITY): Payer: BC Managed Care – PPO | Admitting: Psychiatry

## 2013-01-31 DIAGNOSIS — F329 Major depressive disorder, single episode, unspecified: Secondary | ICD-10-CM

## 2013-01-31 DIAGNOSIS — F32A Depression, unspecified: Secondary | ICD-10-CM

## 2013-02-01 ENCOUNTER — Other Ambulatory Visit (HOSPITAL_COMMUNITY): Payer: BC Managed Care – PPO | Admitting: Psychiatry

## 2013-02-01 DIAGNOSIS — F329 Major depressive disorder, single episode, unspecified: Secondary | ICD-10-CM

## 2013-02-01 DIAGNOSIS — F32A Depression, unspecified: Secondary | ICD-10-CM

## 2013-02-01 NOTE — Progress Notes (Signed)
    Daily Group Progress Note  Program: IOP  Group Time: 9:00-10:30 am   Participation Level: Active  Behavioral Response: Appropriate  Type of Therapy:  Process Group  Summary of Progress: Pt arrived at 9:30 am again today, which is her pattern. Pt admitted that she does not want to be attending group and she does not look forward to coming. Pt said is usually is not as bad as she thought it would be, but this could be linked to her continued tardiness. Pt is disruptive to the group process by checking her phone and engaging in side conversations while others are talking. Pt continues to lack insight into how this behavior negatively affects others, despite being told by members. Pt is working on Psychiatrist to help her in social situations and on managing symptoms of depression linked to this lack of social skills.      Group Time: 10:30 am - 12:00 pm   Participation Level:  Active  Behavioral Response: Appropriate  Type of Therapy: Psycho-education Group  Summary of Progress: Pt  Learned about the symptoms of anxiety and bipolar disorder and how to track symptoms to ensure overall mood stability and wellness.   Carman Ching, LCSW

## 2013-02-01 NOTE — Progress Notes (Signed)
    Daily Group Progress Note  Program: IOP  Group Time: 9:00-10:30 am   Participation Level: Minimal  Behavioral Response: Passive-Aggressive and Resistant  Type of Therapy:  Process Group  Summary of Progress: Pt arrived at 10:00 am this morning and entered the room in a disrutive and talkative manner while other members were sharing. Pt was asked to sit down to allow members to finish talking. Pt became upset by this boundary and started texting in a very distractign manner on her cell phone. Pt has been asked repeatedly to come on time and to avoid using cell phones during group time. Pt also left the room to take a phone call. Pt continues to struggle with the policies and structure of the group and the psychiatrist requested an indivudal meeting with her before Pt can return to the group setting.      Group Time: 10:30 am - 12:00 pm   Participation Level:  Minimal  Behavioral Response: Resistant  Type of Therapy: Psycho-education Group  Summary of Progress: pt was inattentive, texted on her phone and left the group room without notifying staff at 11:40 am  Khristi Schiller E, LCSW

## 2013-02-02 ENCOUNTER — Other Ambulatory Visit (HOSPITAL_COMMUNITY): Payer: BC Managed Care – PPO

## 2013-02-02 NOTE — Progress Notes (Signed)
Patient ID: Alexis [name], female   DOB: 10/29/1990, 22 y.o.   MR Patient seen along with Jeri Modena case manager IOP today. It was discussed with the patient her patch today in group aware she comes half hour to one are late and then is using her cell phone despite being told that she cannot do that. She's also been asked to come in time but patient chooses not to do so. It was discussed with he, that she needed to come on time and not use her cell phone. If she is unable to do so then with half to discuss possible discharge she stated understanding and states that she'll try to come on time.

## 2013-02-04 ENCOUNTER — Other Ambulatory Visit: Payer: Self-pay | Admitting: Internal Medicine

## 2013-02-05 ENCOUNTER — Other Ambulatory Visit: Payer: Self-pay | Admitting: Internal Medicine

## 2013-02-05 ENCOUNTER — Other Ambulatory Visit (HOSPITAL_COMMUNITY): Payer: BC Managed Care – PPO | Admitting: Psychiatry

## 2013-02-05 DIAGNOSIS — F329 Major depressive disorder, single episode, unspecified: Secondary | ICD-10-CM

## 2013-02-05 DIAGNOSIS — F32A Depression, unspecified: Secondary | ICD-10-CM

## 2013-02-05 NOTE — Telephone Encounter (Signed)
This is drysol can refill x 1

## 2013-02-05 NOTE — Telephone Encounter (Signed)
Not sure what this is.  Please advise.  Thanks!

## 2013-02-05 NOTE — Progress Notes (Signed)
    Daily Group Progress Note  Program: IOP  Group Time: 9:00-10:30 am   Participation Level: Active  Behavioral Response: Appropriate  Type of Therapy:  Process Group  Summary of Progress: Pt met individually with the doctor and case manager this past Friday to address Pts consistent tardiness and use of cell phone during group time. Pt arrived on time today was was appropriate during group time.      Group Time: 10:30 am - 12:00 pm   Participation Level:  Active  Behavioral Response: Appropriate  Type of Therapy: Psycho-education Group  Summary of Progress: Pt participated in a group with a focus on grief and loss and identified current losses and effective grieving strategies.   Carman Ching, LCSW

## 2013-02-06 ENCOUNTER — Other Ambulatory Visit (HOSPITAL_COMMUNITY): Payer: BC Managed Care – PPO

## 2013-02-07 ENCOUNTER — Other Ambulatory Visit (HOSPITAL_COMMUNITY): Payer: BC Managed Care – PPO

## 2013-02-09 ENCOUNTER — Other Ambulatory Visit (HOSPITAL_COMMUNITY): Payer: BC Managed Care – PPO

## 2013-02-12 ENCOUNTER — Other Ambulatory Visit (HOSPITAL_COMMUNITY): Payer: BC Managed Care – PPO | Attending: Psychiatry

## 2013-02-13 ENCOUNTER — Other Ambulatory Visit (HOSPITAL_COMMUNITY): Payer: BC Managed Care – PPO | Admitting: Psychiatry

## 2013-02-13 DIAGNOSIS — F32A Depression, unspecified: Secondary | ICD-10-CM

## 2013-02-13 DIAGNOSIS — F329 Major depressive disorder, single episode, unspecified: Secondary | ICD-10-CM

## 2013-02-13 NOTE — Progress Notes (Signed)
    Daily Group Progress Note  Program: IOP  Group Time: 9:00-10:30 am   Participation Level: Active  Behavioral Response: Appropriate  Type of Therapy:  Process Group  Summary of Progress: Pt arrived thirty minutes late at 9:30 am. Pt continues to have a pattern of tardiness that Pt is not addressing. Pt was scheduled to discharge today, but we rescheduled it for tomorrow due to Pt calling out sick yesterday. Pt continues to have high depression and anxiety symptoms and does not report much improvement. Pt lacks the motivation currently to work the group therapy program.      Group Time: 10:30 am - 12:00 pm   Participation Level:  Active  Behavioral Response: Appropriate  Type of Therapy: Psycho-education Group  Summary of Progress: Pt learned about Triggers and identified personal triggers getting in the way of wellness and causing anxiety, anger and depression symptoms.  Carman Ching, LCSW

## 2013-02-14 ENCOUNTER — Other Ambulatory Visit (HOSPITAL_COMMUNITY): Payer: BC Managed Care – PPO | Admitting: Psychiatry

## 2013-02-14 DIAGNOSIS — F32A Depression, unspecified: Secondary | ICD-10-CM

## 2013-02-14 DIAGNOSIS — F329 Major depressive disorder, single episode, unspecified: Secondary | ICD-10-CM

## 2013-02-14 NOTE — Progress Notes (Signed)
    Daily Group Progress Note  Program: IOP  Group Time: 9:00-10:30 am   Participation Level: Active  Behavioral Response: Appropriate  Type of Therapy:  Process Group  Summary of Progress: Today was Pts last day in the group. Pt talked about being glad she decided to participate and how she likes herself more than when she started. Pt said she is more motivated to do things with friends and feels less depressed than when she started in the group.      Group Time: 10:30 am - 12:00 pm   Participation Level:  Active  Behavioral Response: Appropriate  Type of Therapy: Psycho-education Group  Summary of Progress: Pt learned about overcaring and how this affects anxiety and depression and how to set healthier limits with others.   Carman Ching, LCSW
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
## 2013-02-14 NOTE — Patient Instructions (Signed)
Patient completed MH-IOP today.  Will follow up with Bradley Ferris, LCAS on 02-20-12 @ 12:15 pm.  Pt will call Dr. Dub Mikes for an appointment.  Encouraged support groups.
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
## 2013-02-14 NOTE — Addendum Note (Signed)
Addended by: Margit Banda D on: 02/14/2013 02:13 PM   Modules accepted: Orders

## 2013-02-16 ENCOUNTER — Other Ambulatory Visit (HOSPITAL_COMMUNITY): Payer: BC Managed Care – PPO | Attending: Psychiatry

## 2013-02-19 ENCOUNTER — Other Ambulatory Visit (HOSPITAL_COMMUNITY): Payer: BC Managed Care – PPO

## 2013-02-20 ENCOUNTER — Other Ambulatory Visit (HOSPITAL_COMMUNITY): Payer: BC Managed Care – PPO

## 2013-06-08 ENCOUNTER — Other Ambulatory Visit (INDEPENDENT_AMBULATORY_CARE_PROVIDER_SITE_OTHER): Payer: BC Managed Care – PPO

## 2013-06-08 DIAGNOSIS — Z0000 Encounter for general adult medical examination without abnormal findings: Secondary | ICD-10-CM

## 2013-06-08 LAB — HEPATIC FUNCTION PANEL
ALT: 13 U/L (ref 0–35)
AST: 14 U/L (ref 0–37)
Albumin: 3.9 g/dL (ref 3.5–5.2)
Alkaline Phosphatase: 63 U/L (ref 39–117)
Bilirubin, Direct: 0 mg/dL (ref 0.0–0.3)
Total Bilirubin: 0.4 mg/dL (ref 0.3–1.2)
Total Protein: 7.1 g/dL (ref 6.0–8.3)

## 2013-06-08 LAB — CBC WITH DIFFERENTIAL/PLATELET
Basophils Absolute: 0 10*3/uL (ref 0.0–0.1)
Basophils Relative: 0.3 % (ref 0.0–3.0)
Eosinophils Absolute: 0 10*3/uL (ref 0.0–0.7)
Eosinophils Relative: 0.6 % (ref 0.0–5.0)
HCT: 41.5 % (ref 36.0–46.0)
Hemoglobin: 14 g/dL (ref 12.0–15.0)
Lymphocytes Relative: 35.4 % (ref 12.0–46.0)
Lymphs Abs: 2.8 10*3/uL (ref 0.7–4.0)
MCHC: 33.8 g/dL (ref 30.0–36.0)
MCV: 85.8 fl (ref 78.0–100.0)
Monocytes Absolute: 0.6 10*3/uL (ref 0.1–1.0)
Monocytes Relative: 7.1 % (ref 3.0–12.0)
Neutro Abs: 4.4 10*3/uL (ref 1.4–7.7)
Neutrophils Relative %: 56.6 % (ref 43.0–77.0)
Platelets: 333 10*3/uL (ref 150.0–400.0)
RBC: 4.84 Mil/uL (ref 3.87–5.11)
RDW: 13.8 % (ref 11.5–14.6)
WBC: 7.8 10*3/uL (ref 4.5–10.5)

## 2013-06-08 LAB — LIPID PANEL
Cholesterol: 175 mg/dL (ref 0–200)
HDL: 43.7 mg/dL (ref >39.00)
LDL Cholesterol: 109 mg/dL — ABNORMAL HIGH (ref 0–99)
Total CHOL/HDL Ratio: 4
Triglycerides: 111 mg/dL (ref 0.0–149.0)
VLDL: 22.2 mg/dL (ref 0.0–40.0)

## 2013-06-08 LAB — BASIC METABOLIC PANEL
BUN: 11 mg/dL (ref 6–23)
CO2: 24 mEq/L (ref 19–32)
Calcium: 9.3 mg/dL (ref 8.4–10.5)
Chloride: 106 mEq/L (ref 96–112)
Creatinine, Ser: 0.8 mg/dL (ref 0.4–1.2)
GFR: 98.8 mL/min (ref >60.00)
Glucose, Bld: 82 mg/dL (ref 70–99)
Potassium: 4.6 mEq/L (ref 3.5–5.1)
Sodium: 137 mEq/L (ref 135–145)

## 2013-06-08 LAB — TSH: TSH: 0.75 u[IU]/mL (ref 0.35–5.50)

## 2013-06-15 ENCOUNTER — Other Ambulatory Visit (HOSPITAL_COMMUNITY)
Admission: RE | Admit: 2013-06-15 | Discharge: 2013-06-15 | Disposition: A | Discharge status: Home / Self Care | Payer: BC Managed Care – PPO | Source: Ambulatory Visit | Attending: Internal Medicine | Admitting: Internal Medicine

## 2013-06-15 ENCOUNTER — Ambulatory Visit (INDEPENDENT_AMBULATORY_CARE_PROVIDER_SITE_OTHER): Payer: BC Managed Care – PPO | Admitting: Internal Medicine

## 2013-06-15 ENCOUNTER — Encounter: Payer: Self-pay | Admitting: Internal Medicine

## 2013-06-15 VITALS — BP 118/76 | HR 112 | Temp 98.3°F | Ht 64.5 in | Wt 127.0 lb

## 2013-06-15 DIAGNOSIS — Z0000 Encounter for general adult medical examination without abnormal findings: Secondary | ICD-10-CM | POA: Insufficient documentation

## 2013-06-15 DIAGNOSIS — R928 Other abnormal and inconclusive findings on diagnostic imaging of breast: Secondary | ICD-10-CM

## 2013-06-15 DIAGNOSIS — Z01419 Encounter for gynecological examination (general) (routine) without abnormal findings: Secondary | ICD-10-CM | POA: Insufficient documentation

## 2013-06-15 DIAGNOSIS — Z113 Encounter for screening for infections with a predominantly sexual mode of transmission: Secondary | ICD-10-CM | POA: Insufficient documentation

## 2013-06-15 DIAGNOSIS — Z23 Encounter for immunization: Secondary | ICD-10-CM

## 2013-06-15 DIAGNOSIS — J45990 Exercise induced bronchospasm: Secondary | ICD-10-CM

## 2013-06-15 DIAGNOSIS — N63 Unspecified lump in unspecified breast: Secondary | ICD-10-CM | POA: Insufficient documentation

## 2013-06-15 MED ORDER — ALBUTEROL SULFATE HFA 108 (90 BASE) MCG/ACT IN AERS: 2.0000 | INHALATION_SPRAY | Freq: Four times a day (QID) | RESPIRATORY_TRACT | Status: DC | PRN

## 2013-06-15 MED ORDER — TETANUS-DIPHTH-ACELL PERTUSSIS 5-2.5-18.5 LF-MCG/0.5 IM SUSP: 0.5000 mL | Freq: Once | INTRAMUSCULAR | Status: DC

## 2013-06-15 NOTE — Assessment & Plan Note (Signed)
tdap 5 15 pap 5 15

## 2013-06-15 NOTE — Progress Notes (Signed)
Chief Complaint  Patient presents with  . Annual Exam    HPI: Patient comes in today for Preventive Health Care visit  tving to change psych  Because   Dr Dub Mikes not seeing patients  trying to get more protein in diet.   Exercise not formal active. Walking dog.  Sleep about 8 hours . No tobacco.   limited etoh 1-2 1-2 per week.  EIA not bad.   Consider refill to have on hand.  OCPS Periods on continuous therpay  year round.  No  Kidney infection symptoms.  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  Classes.  [employer].  No sa exposures currently  Last pap a number or years ago.   Health Maintenance  Topic Date Due  . Chlamydia Screening  07/04/2005  . Pap Smear  07/04/2008  . Tetanus/tdap  07/04/2009  . Influenza Vaccine  09/15/2013   Health Maintenance Review ROS:  GEN/ HEENT: No fever, significant weight changes sweats headaches vision problems hearing changes, CV/ PULM; No chest pain shortness of breath cough, syncope,edema  change in exercise tolerance. GI /GU: No adominal pain, vomiting, change in bowel habits. No blood in the stool. No significant GU symptoms. SKIN/HEME: ,no acute skin rashes suspicious lesions or bleeding. No lymphadenopathy, nodules, masses.  Legs have  Discoloration from prev folliculitis thinking of saving money for removal to see if helps  NEURO/ PSYCH:  No neurologic signs such as weakness numbness. Seemingly stable depression anxiety. IMM/ Allergy: No unusual infections.  Allergy .  Except as above REST of 12 system review negative except as per HPI   Past Medical History  Diagnosis Date  . ALCOHOL USE 07/22/2009  . ATTENTION DEFICIT DISORDER 04/28/2007     hx of speech therapy  . EXERCISE INDUCED ASTHMA 04/28/2007  . MUCOSITIS ULCERATIVE OF CERVIX VAGINA AND VULVA 08/08/2008    herpes  . PRIMARY DYSMENORRHEA 04/28/2007  . ACNE VULGARIS 04/28/2007  . POSTURAL LIGHTHEADEDNESS 04/28/2007  . Rash and other nonspecific skin eruption 07/22/2009    . Dysuria 08/08/2008  . Abdominal pain, generalized 08/08/2008  . History of pyelonephritis     acute right  4 2011  . Alcohol abuse, in remission      hx of amnesia   . HSV (herpes simplex virus) anogenital infection     hx of same  . Anxiety   . Depression   . Chronic kidney disease   . Bipolar disorder     Family History  Problem Relation Age of Onset  . Heart attack Mother   . Asthma    . Diabetes type II    . Alcohol abuse Paternal Grandmother   . Depression Paternal Grandmother     History   Social History  . Marital Status: Single    Spouse Name: N/A    Number of Children: N/A  . Years of Education: N/A   Social History Main Topics  . Smoking status: Former Smoker -- 0.30 packs/day    Types: Cigarettes  . Smokeless tobacco: None     Comment: per pt 4 cigarettes a day   . Alcohol Use: Yes  . Drug Use: No     Comment: In the past- Pot 3 months ago  . Sexual Activity: Yes    Birth Control/ Protection: Pill   Other Topics Concern  . None   Social History Narrative   [employer] history  And english  Dropped out temporarily getting psych help  On  line courses    No ets    [employer] 4    Volunteers [employer]   Limited etoh   Stopped tobacco    Outpatient Encounter Prescriptions as of 06/15/2013  Medication Sig  . BuPROPion HCl ER, XL, (FORFIVO XL) 450 MG TB24 Take 1 tablet by mouth daily.  Marland Kitchen. ethynodiol-ethinyl estradiol [place](KELNOR 1/35) 1-35 MG-MCG tablet Take on pill daily.  Skip last week and continue.  Marland Kitchen. HYPERCARE 20 % external solution Apply externally to skin daily at bedtime  . lamoTRIgine (LAMICTAL) 100 MG tablet Take 100 mg by mouth at bedtime.   Marland Kitchen. lisdexamfetamine (VYVANSE) 50 MG capsule Take 100 mg by mouth every morning.  . valACYclovir (VALTREX) 500 MG tablet Take 500 mg by mouth daily as needed (outbreak.).  Marland Kitchen. albuterol (PROAIR HFA) 108 (90 BASE) MCG/ACT inhaler Inhale 2 puffs into the lungs every 6 (six) hours as needed for wheezing or shortness of breath.   . [DISCONTINUED] sertraline (ZOLOFT) 50 MG tablet Take 50 mg by mouth at bedtime.    EXAM:  BP 118/76  Pulse 112  Temp(Src) 98.3 F (36.8 C) (Oral)  Ht 5' 4.5" (1.638 m)  Wt 127 lb (57.607 kg)  BMI 21.47 kg/m2  SpO2 98%  Body mass index is 21.47 kg/(m^2).  Physical Exam: Vital signs reviewed ZOX:WRUEGEN:This is a well-developed well-nourished alert cooperative    who appearsr stated age in no acute distress.  HEENT: normocephalic atraumatic , Eyes: PERRL EOM's full, conjunctiva clear, Nares: paten,t no deformity discharge or tenderness., Ears: no deformity EAC's clear TMs with normal landmarks. Mouth: clear OP, no lesions, edema.  Moist mucous membranes. Dentition in adequate repair. NECK: supple without masses, thyromegaly or bruits. CHEST/PULM:  Clear to auscultation and percussion breath sounds equal no wheeze , rales or rhonchi. No chest wall deformities or tenderness. Breast: normal by inspection . Right breast about 11 o'clock UOQ mobile pea /Marble sized smooth firm nodule mobile non tender  No dimpling, discharge, , tenderness or discharge .axilla clear CV: PMI is nondisplaced, S1 S2 no gallops, murmurs, rubs. Peripheral pulses are full without delay.No JVD .  ABDOMEN: Bowel sounds normal nontender  No guard or rebound, no hepato splenomegal no CVA tenderness.  No hernia. Extremtities:  No clubbing cyanosis or edema, no acute joint swelling or redness no focal atrophy NEURO:  Oriented x3, cranial nerves 3-12 appear to be intact, no obvious focal weakness,gait within normal limits no abnormal reflexes or asymmetrical SKIN: No acute rashes normal turgor, color, no bruising or petechiae. Legs with faded post inflammatory pigmentation in follicular pattern PSYCH: Oriented, good eye contact, no obvious depression anxiety, cognition and judgment appear normal. LN: no cervical axillary inguinal adenopathy Pelvic: NL ext GU, labia clear without lesions or rash . Vagina no lesions .Cervix:  clear  UTERUS: Neg CMT 1 + ectopy no edema slight friability  Adnexa:  clear no masses . PAP done gc chl screen   Lab Results  Component Value Date   WBC 7.8 06/08/2013   HGB 14.0 06/08/2013   HCT 41.5 06/08/2013   PLT 333.0 06/08/2013   GLUCOSE 82 06/08/2013   CHOL 175 06/08/2013   TRIG 111.0 06/08/2013   HDL 43.70 06/08/2013   LDLDIRECT 116.0 04/27/2011   LDLCALC 109* 06/08/2013   ALT 13 06/08/2013   AST 14 06/08/2013   NA 137 06/08/2013   K 4.6 06/08/2013   CL 106 06/08/2013   CREATININE 0.8 06/08/2013   BUN 11 06/08/2013   CO2 24 06/08/2013  TSH 0.75 06/08/2013    ASSESSMENT AND PLAN:  Discussed the following assessment and plan:  Visit for preventive health examination - Plan: PAP [[place]]  Routine gynecological examination - Plan: PAP [[place]]  Exercise induced bronchospasm - quiescent rx rescue if needed  Need for prophylactic vaccination with combined diphtheria-tetanus-pertussis (DTP) vaccine - Plan: Tdap (BOOSTRIX) injection 0.5 mL  Breast nodule Labs reviewed with patient   Skin discoloration consider see derm  [facility]  Patient Care Team: Madelin HeadingsWanda K Kemond Amorin, MD as PCP - General Patient Instructions  Talk with your mom . You have a breast nodule probably a cyst   But needs follow up.  Either recheck area in 1 month or get breast ultrasound we can order.  Will notify you  of PAP  when available. Sign up for my chart .  Can be helpful.foir communications. Continue lifestyle intervention healthy eating and exercise . 150 minutes of exercise weeks  ,weight at   healthy levels. Avoid trans fats and processed foods;  Increase fresh fruits and veges to 5 servings per day. And avoid sweet beverages  Including tea and juice.      Neta MendsWanda K. Alanys Godino M.D.   Pre visit review using our clinic review tool, if applicable. No additional management support is needed unless otherwise documented below in the visit note.

## 2013-06-15 NOTE — Patient Instructions (Signed)
Talk with your mom . You have a breast nodule probably a cyst   But needs follow up.  Either recheck area in 1 month or get breast ultrasound we can order.  Will notify you  of PAP  when available. Sign up for my chart .  Can be helpful.foir communications. Continue lifestyle intervention healthy eating and exercise . 150 minutes of exercise weeks  ,weight at   healthy levels. Avoid trans fats and processed foods;  Increase fresh fruits and veges to 5 servings per day. And avoid sweet beverages  Including tea and juice.

## 2013-06-19 NOTE — Progress Notes (Signed)
Quick Note:  Tell patient PAP is normal. ______ 

## 2013-06-20 ENCOUNTER — Encounter: Payer: Self-pay | Admitting: Family Medicine

## 2013-06-25 ENCOUNTER — Other Ambulatory Visit: Payer: Self-pay | Admitting: Family Medicine

## 2013-06-25 DIAGNOSIS — R928 Other abnormal and inconclusive findings on diagnostic imaging of breast: Secondary | ICD-10-CM

## 2013-07-24 ENCOUNTER — Encounter: Payer: Self-pay | Admitting: Internal Medicine

## 2013-07-25 ENCOUNTER — Encounter: Payer: Self-pay | Admitting: Internal Medicine

## 2013-08-29 ENCOUNTER — Ambulatory Visit (INDEPENDENT_AMBULATORY_CARE_PROVIDER_SITE_OTHER): Payer: BC Managed Care – PPO | Admitting: Internal Medicine

## 2013-08-29 ENCOUNTER — Encounter: Payer: Self-pay | Admitting: Internal Medicine

## 2013-08-29 VITALS — BP 116/74 | HR 116 | Temp 99.2°F | Wt 127.0 lb

## 2013-08-29 DIAGNOSIS — Z8669 Personal history of other diseases of the nervous system and sense organs: Secondary | ICD-10-CM

## 2013-08-29 DIAGNOSIS — R0981 Nasal congestion: Secondary | ICD-10-CM

## 2013-08-29 DIAGNOSIS — J3489 Other specified disorders of nose and nasal sinuses: Secondary | ICD-10-CM

## 2013-08-29 MED ORDER — FLUTICASONE PROPIONATE 50 MCG/ACT NA SUSP
2.0000 | Freq: Every day | NASAL | Status: DC
Start: 2013-08-29 — End: 2016-02-13

## 2013-08-29 NOTE — Progress Notes (Signed)
Pre visit review using our clinic review tool, if applicable. No additional management support is needed unless otherwise documented below in the visit note.  Chief Complaint  Patient presents with  . Sinus Pressure    Ongoing for several weeks.    HPI: Patient comes in today for SDA for  new problem evaluation. About 2 weeks of congestion in ears and not abated.  No fever  ocass sinus pain breathing finde  Nasal and forntal forehead  Pressure  Aggravating  Migraine.   No meds tried   netti pot.  Minimal help . Saline   Help some.    Tried sudafed and benadryl   ? If helps . ROS: See pertinent positives and negatives per HPI.no cp sob syncope migraine as usual no fever  Past Medical History  Diagnosis Date  . ALCOHOL USE 07/22/2009  . ATTENTION DEFICIT DISORDER 04/28/2007     hx of speech therapy  . EXERCISE INDUCED ASTHMA 04/28/2007  . MUCOSITIS ULCERATIVE OF CERVIX VAGINA AND VULVA 08/08/2008    herpes  . PRIMARY DYSMENORRHEA 04/28/2007  . ACNE VULGARIS 04/28/2007  . POSTURAL LIGHTHEADEDNESS 04/28/2007  . Rash and other nonspecific skin eruption 07/22/2009  . Dysuria 08/08/2008  . Abdominal pain, generalized 08/08/2008  . History of pyelonephritis     acute right  4 2011  . Alcohol abuse, in remission      hx of amnesia   . HSV (herpes simplex virus) anogenital infection     hx of same  . Anxiety   . Depression   . Chronic kidney disease   . Bipolar disorder     Family History  Problem Relation Age of Onset  . Heart attack Mother   . Asthma    . Diabetes type II    . Alcohol abuse Paternal Grandmother   . Depression Paternal Grandmother     History   Social History  . Marital Status: Single    Spouse Name: N/A    Number of Children: N/A  . Years of Education: N/A   Social History Main Topics  . Smoking status: Former Smoker -- 0.30 packs/day    Types: Cigarettes  . Smokeless tobacco: None     Comment: per pt 4 cigarettes a day   . Alcohol Use: Yes  . Drug Use:  No     Comment: In the past- Pot 3 months ago  . Sexual Activity: Yes    Birth Control/ Protection: Pill   Other Topics Concern  . None   Social History Narrative   [employer] history  And english  Dropped out temporarily getting psych help  On line courses    No ets    [employer] 4    Volunteers [employer]   Limited etoh   Stopped tobacco    Outpatient Encounter Prescriptions as of 08/29/2013  Medication Sig  . albuterol (PROAIR HFA) 108 (90 BASE) MCG/ACT inhaler Inhale 2 puffs into the lungs every 6 (six) hours as needed for wheezing or shortness of breath.  . BuPROPion HCl ER, XL, (FORFIVO XL) 450 MG TB24 Take 1 tablet by mouth daily.  Marland Kitchen. ethynodiol-ethinyl estradiol [place](KELNOR 1/35) 1-35 MG-MCG tablet Take on pill daily.  Skip last week and continue.  Marland Kitchen. HYPERCARE 20 % external solution Apply externally to skin daily at bedtime  . lamoTRIgine (LAMICTAL) 100 MG tablet Take 100 mg by mouth at bedtime.   Marland Kitchen. lisdexamfetamine (VYVANSE) 50 MG capsule Take 100 mg by mouth every morning.  . valACYclovir (VALTREX)  500 MG tablet Take 500 mg by mouth daily as needed (outbreak.).  Marland Kitchen fluticasone (FLONASE) 50 MCG/ACT nasal spray Place 2 sprays into both nostrils daily.    EXAM:  BP 116/74  Pulse 116  Temp(Src) 99.2 F (37.3 C) (Oral)  Wt 127 lb (57.607 kg)  SpO2 98%  Body mass index is 21.47 kg/(m^2).  GENERAL: vitals reviewed and listed above, alert, oriented, appears well hydrated and in no acute distress mildly congested  HEENT: atraumatic, conjunctiva  clear, no obvious abnormalities on inspection of external nose and ears OP : no lesion edema or exudate tms  No acute changes  Nares slgiht congestion face mild tenderness maxilla parasal and frontal  NECK: no obvious masses on inspection palpation  LUNGS: clear to auscultation bilaterally, no wheezes, rales or rhonchi, good air movement CV: HRRR, no clubbing cyanosis or  peripheral edema nl cap refill  MS: moves all extremities without noticeable  focal  abnormality PSYCH: pleasant and cooperative,   ASSESSMENT AND PLAN:  Discussed the following assessment and plan:  Sinus congestion - ? allergic al low grade inf  effecting migraines  no alarm features optinos discussed  History of migraine headaches  -Patient advised to return or notify health care team  if symptoms worsen ,persist or new concerns arise.  Patient Instructions  Add nasal cortisone ,  Every day for congestion , flonase is otc but can do a prescription. After 5-7 days hopefully will help .   Could try antibiotic and prednisone uncertain that this will help .      Neta Mends. Panosh M.D.

## 2013-08-29 NOTE — Patient Instructions (Addendum)
Add nasal cortisone ,  Every day for congestion , flonase is otc but can do a prescription. After 5-7 days hopefully will help .   Could try antibiotic and prednisone uncertain that this will help .

## 2013-11-27 ENCOUNTER — Other Ambulatory Visit: Payer: Self-pay | Admitting: Internal Medicine

## 2013-11-27 NOTE — Telephone Encounter (Signed)
Sent to the pharmacy by e-scribe. 

## 2013-11-30 ENCOUNTER — Ambulatory Visit (INDEPENDENT_AMBULATORY_CARE_PROVIDER_SITE_OTHER): Payer: BC Managed Care – PPO

## 2013-11-30 DIAGNOSIS — Z23 Encounter for immunization: Secondary | ICD-10-CM

## 2013-12-28 IMAGING — CT CT ABD-PELV W/O CM
1 series · 11 of 32 positions shown, 13 images · non-contrast
Comparison: Prior renal ultrasound 06/27/2009

CLINICAL DATA: Left flank pain

CT ABDOMEN AND PELVIS WITHOUT CONTRAST
TECHNIQUE: Multidetector CT imaging of the abdomen and pelvis was
performed following the standard protocol without intravenous
contrast.

[Series 6: sagittal · sagittal · 0.61mm/px · 11 of 128 slices shown, 13 images]
[im 5/128  lung]
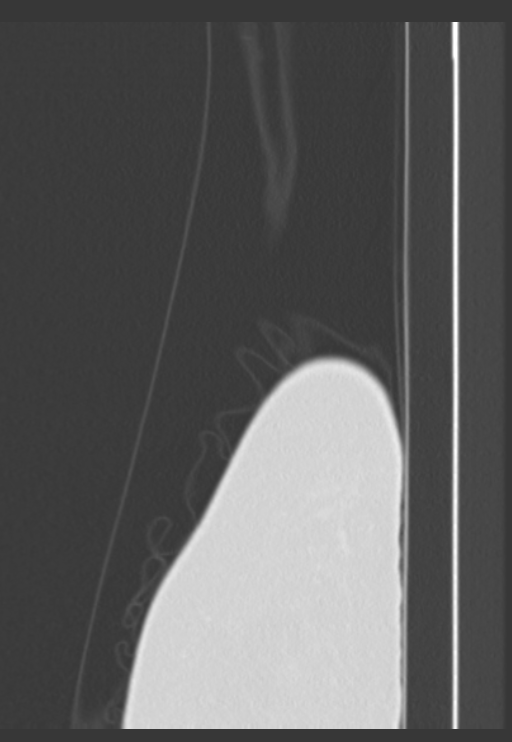
[im 9/128  soft-tissue]
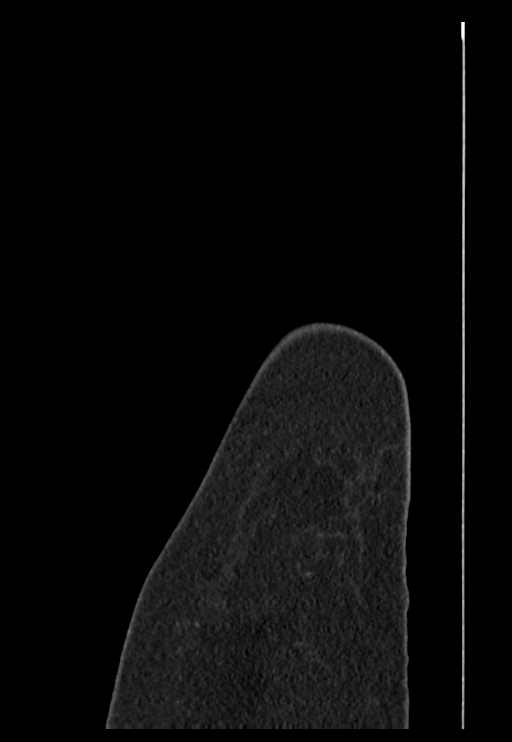
[im 9/128  lung]
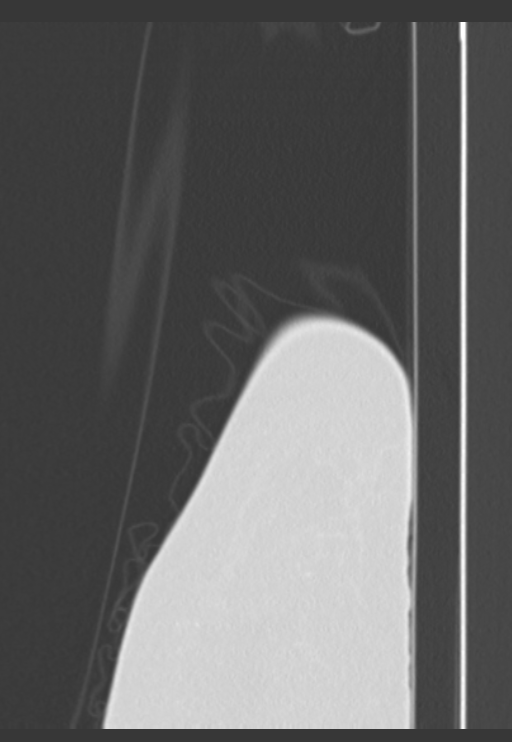
[im 9/128  bone]
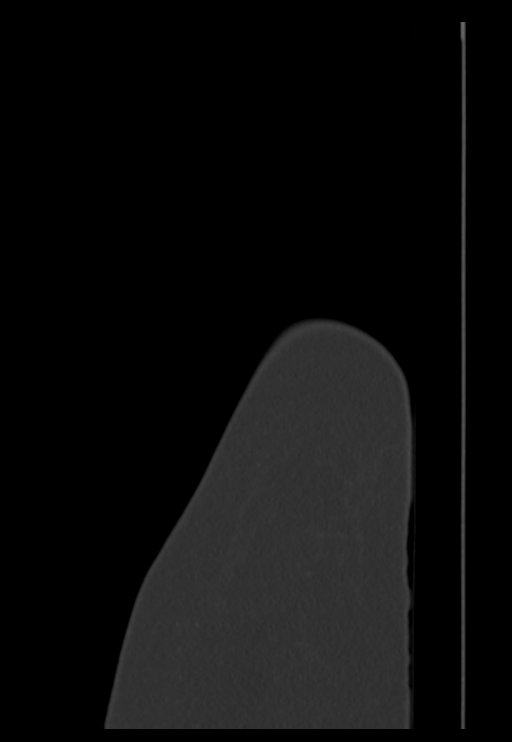
[im 13/128  lung]
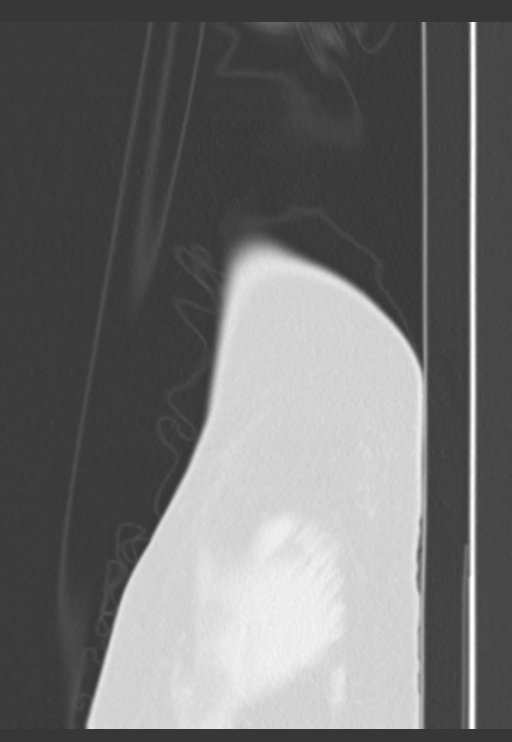
[im 17/128  lung]
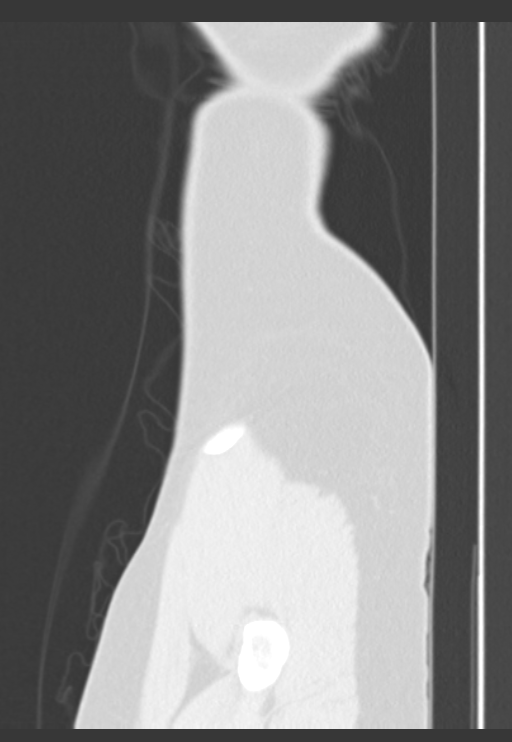
[im 25/128  soft-tissue]
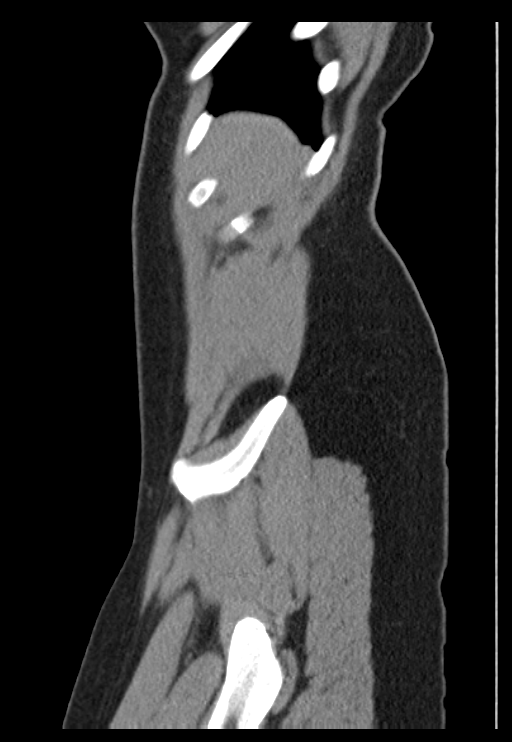
[im 41/128  soft-tissue]
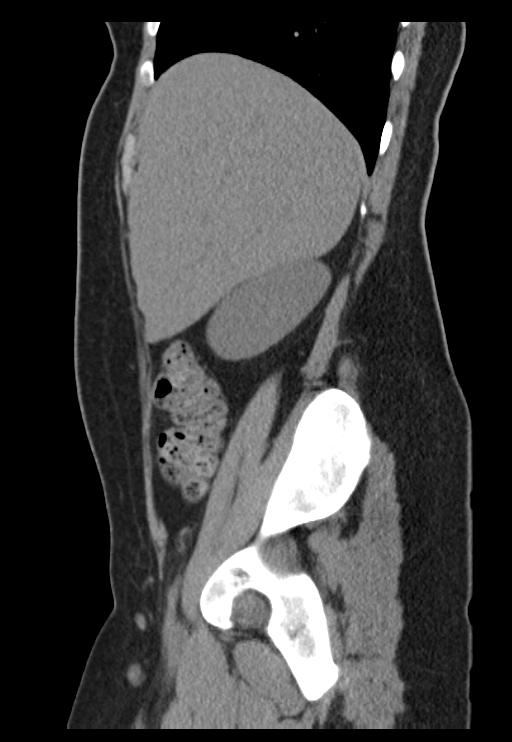
[im 58/128  soft-tissue]
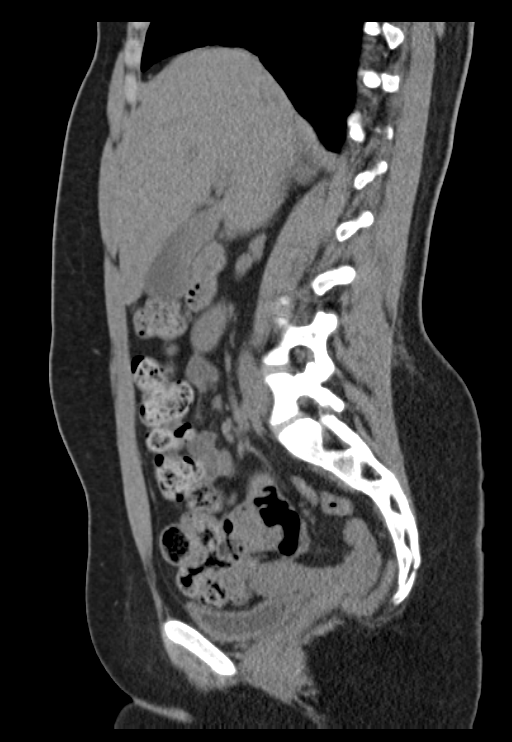
[im 70/128  soft-tissue]
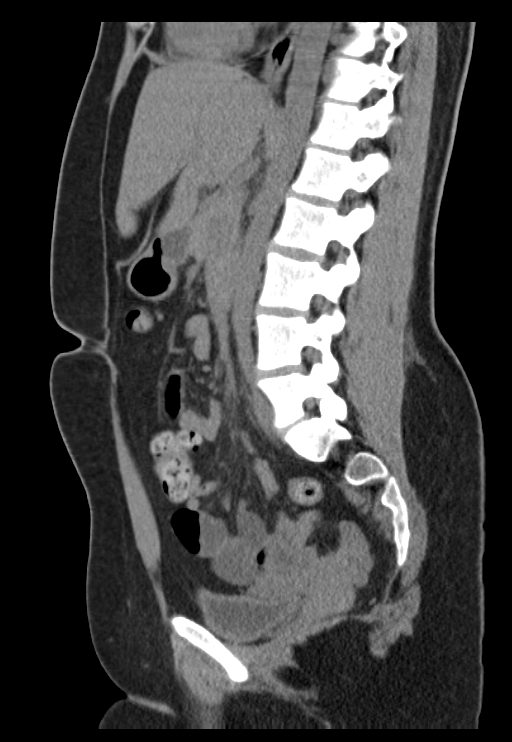
[im 87/128  soft-tissue]
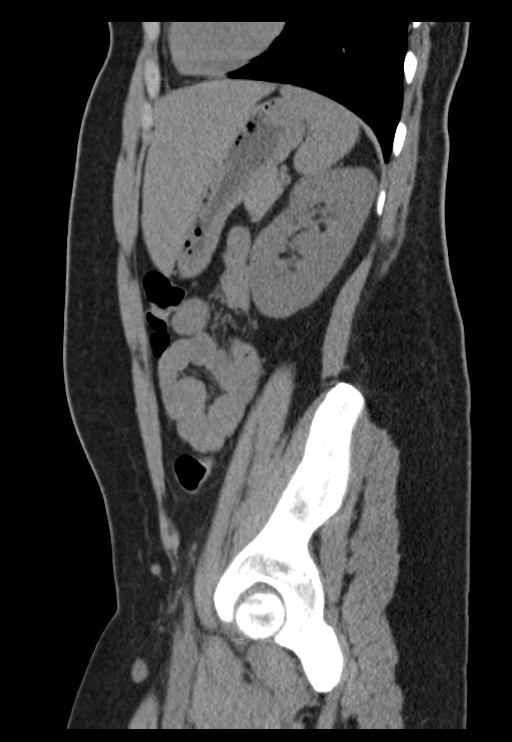
[im 103/128  soft-tissue]
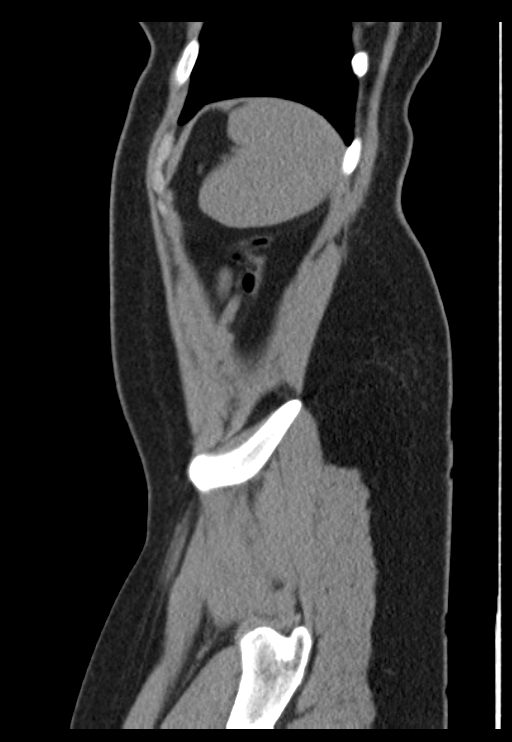
[im 119/128  soft-tissue]
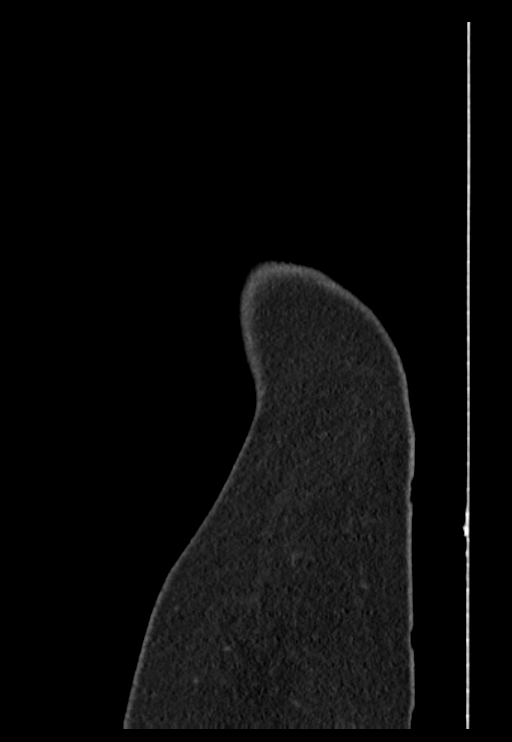

[11 of 32 positions shown; findings below may reference images not displayed]

FINDINGS: Lower Chest:  The lung bases are clear.  The visualized cardiac
structures within normal limits for size.  No pericardial effusion.
Unremarkable distal thoracic esophagus.

Abdomen: Unenhanced CT was performed per clinician order.  Lack of
IV contrast limits sensitivity and specificity, especially for
evaluation of abdominal/pelvic solid viscera.  Within these
limitations, unremarkable CT appearance of the stomach, duodenum,
spleen, adrenal glands and pancreas.  Normal hepatic contours and
morphology.  No focal lesion. Gallbladder is unremarkable. No intra
or extrahepatic biliary ductal dilatation.

Unremarkable CT appearance of the kidneys.  No hydronephrosis or
nephrolithiasis.

Normal-caliber large and small bowel throughout the abdomen.  No
evidence of obstruction.  Normal appendix in the right lower
quadrant.

Pelvis: Unremarkable CT appearance of the bladder, uterus and
adnexa.  Trace free fluid in the pelvis is likely physiologic.

Bones: No acute fracture or aggressive appearing lytic or blastic
osseous lesion.

Vascular: No focal vascular abnormality.
IMPRESSION: No acute abnormality in the abdomen pelvis explain the patient's
clinical symptoms.  Specifically, no evidence of nephrolithiasis,
hydronephrosis or appendicitis.

## 2014-02-06 ENCOUNTER — Encounter: Payer: Self-pay | Admitting: Internal Medicine

## 2014-05-02 ENCOUNTER — Other Ambulatory Visit: Payer: Self-pay | Admitting: Internal Medicine

## 2014-05-02 NOTE — Telephone Encounter (Signed)
Sent to the pharmacy by e-scribe. 

## 2014-06-17 ENCOUNTER — Other Ambulatory Visit: Payer: Self-pay | Admitting: Internal Medicine

## 2014-06-18 ENCOUNTER — Other Ambulatory Visit: Payer: Self-pay | Admitting: Family Medicine

## 2014-06-18 ENCOUNTER — Telehealth: Payer: Self-pay | Admitting: Family Medicine

## 2014-06-18 DIAGNOSIS — Z0000 Encounter for general adult medical examination without abnormal findings: Secondary | ICD-10-CM

## 2014-06-18 NOTE — Telephone Encounter (Signed)
This patient is now due for her lab work and CPX.  Please contact the pt and make these appointments.  Thanks! I have entered the lab orders.

## 2014-06-18 NOTE — Telephone Encounter (Signed)
Sent to the pharmacy.  Pt is now due for CPX.  Will send a message to scheduling.

## 2014-06-18 NOTE — Telephone Encounter (Signed)
lmom for pt to sch cpx °

## 2014-06-20 NOTE — Telephone Encounter (Signed)
Pt has been sch

## 2014-07-10 ENCOUNTER — Other Ambulatory Visit (INDEPENDENT_AMBULATORY_CARE_PROVIDER_SITE_OTHER): Payer: Self-pay

## 2014-07-10 DIAGNOSIS — Z0000 Encounter for general adult medical examination without abnormal findings: Secondary | ICD-10-CM

## 2014-07-10 LAB — CBC WITH DIFFERENTIAL/PLATELET
Basophils Absolute: 0 10*3/uL (ref 0.0–0.1)
Basophils Relative: 0.2 % (ref 0.0–3.0)
Eosinophils Absolute: 0.1 10*3/uL (ref 0.0–0.7)
Eosinophils Relative: 1 % (ref 0.0–5.0)
HCT: 40.1 % (ref 36.0–46.0)
Hemoglobin: 13.9 g/dL (ref 12.0–15.0)
Lymphocytes Relative: 50.1 % — ABNORMAL HIGH (ref 12.0–46.0)
Lymphs Abs: 2.8 10*3/uL (ref 0.7–4.0)
MCHC: 34.7 g/dL (ref 30.0–36.0)
MCV: 87.1 fl (ref 78.0–100.0)
Monocytes Absolute: 0.4 10*3/uL (ref 0.1–1.0)
Monocytes Relative: 6.5 % (ref 3.0–12.0)
Neutro Abs: 2.4 10*3/uL (ref 1.4–7.7)
Neutrophils Relative %: 42.2 % — ABNORMAL LOW (ref 43.0–77.0)
Platelets: 238 10*3/uL (ref 150.0–400.0)
RBC: 4.61 Mil/uL (ref 3.87–5.11)
RDW: 12.9 % (ref 11.5–15.5)
WBC: 5.7 10*3/uL (ref 4.0–10.5)

## 2014-07-10 LAB — HEPATIC FUNCTION PANEL
ALT: 16 U/L (ref 0–35)
AST: 12 U/L (ref 0–37)
Albumin: 4 g/dL (ref 3.5–5.2)
Alkaline Phosphatase: 39 U/L (ref 39–117)
Bilirubin, Direct: 0.1 mg/dL (ref 0.0–0.3)
Total Bilirubin: 0.3 mg/dL (ref 0.2–1.2)
Total Protein: 6.5 g/dL (ref 6.0–8.3)

## 2014-07-10 LAB — LIPID PANEL
Cholesterol: 171 mg/dL (ref 0–200)
HDL: 53.4 mg/dL (ref >39.00)
LDL Cholesterol: 100 mg/dL — ABNORMAL HIGH (ref 0–99)
NonHDL: 117.6
Total CHOL/HDL Ratio: 3
Triglycerides: 90 mg/dL (ref 0.0–149.0)
VLDL: 18 mg/dL (ref 0.0–40.0)

## 2014-07-10 LAB — TSH: TSH: 0.49 u[IU]/mL (ref 0.35–4.50)

## 2014-07-10 LAB — BASIC METABOLIC PANEL
BUN: 15 mg/dL (ref 6–23)
CO2: 27 mEq/L (ref 19–32)
Calcium: 8.9 mg/dL (ref 8.4–10.5)
Chloride: 107 mEq/L (ref 96–112)
Creatinine, Ser: 0.83 mg/dL (ref 0.40–1.20)
GFR: 89.76 mL/min (ref >60.00)
Glucose, Bld: 90 mg/dL (ref 70–99)
Potassium: 4.4 mEq/L (ref 3.5–5.1)
Sodium: 137 mEq/L (ref 135–145)

## 2014-07-17 ENCOUNTER — Encounter: Payer: Self-pay | Admitting: Internal Medicine

## 2014-07-17 ENCOUNTER — Ambulatory Visit (INDEPENDENT_AMBULATORY_CARE_PROVIDER_SITE_OTHER): Payer: BLUE CROSS/BLUE SHIELD | Admitting: Internal Medicine

## 2014-07-17 VITALS — BP 106/72 | Temp 99.0°F | Ht 64.75 in | Wt 110.0 lb

## 2014-07-17 DIAGNOSIS — Z79899 Other long term (current) drug therapy: Secondary | ICD-10-CM | POA: Diagnosis not present

## 2014-07-17 DIAGNOSIS — Z8669 Personal history of other diseases of the nervous system and sense organs: Secondary | ICD-10-CM

## 2014-07-17 DIAGNOSIS — R350 Frequency of micturition: Secondary | ICD-10-CM

## 2014-07-17 DIAGNOSIS — Z0000 Encounter for general adult medical examination without abnormal findings: Secondary | ICD-10-CM | POA: Diagnosis not present

## 2014-07-17 DIAGNOSIS — Z113 Encounter for screening for infections with a predominantly sexual mode of transmission: Secondary | ICD-10-CM | POA: Diagnosis not present

## 2014-07-17 LAB — POCT URINALYSIS DIPSTICK
Glucose, UA: NEGATIVE
Leukocytes, UA: NEGATIVE
Nitrite, UA: NEGATIVE
Spec Grav, UA: 1.025
Urobilinogen, UA: 0.2
pH, UA: 6

## 2014-07-17 NOTE — Progress Notes (Signed)
Pre visit review using our clinic review tool, if applicable. No additional management support is needed unless otherwise documented below in the visit note.  Chief Complaint  Patient presents with  . Annual Exam    urinary frequency    HPI: Patient  Alexis [name]  24 y.o. comes in today for Preventive Health Care visit  Continuous therapy ocass btb helps migraine and cramps   ocass takes otc migraine excedrin  No sa risk over 6 months  Dec appetite in summer when hot  Like head intolerance   No med change psych wise stable finishing [employer] and working now [employer] Date Due  . CHLAMYDIA SCREENING  01/31/2013  . INFLUENZA VACCINE  09/16/2014  . PAP SMEAR  06/15/2016  . TETANUS/TDAP  06/16/2023  . HIV Screening  Completed   Health Maintenance Review LIFESTYLE:  Exercise:   ocass swimming .  Tobacco/ETS: ocass .  2 x per week.  Alcohol: 3 at most if out. ocass  .   Per week. ocass  7  Sugar beverages: not a lot  Sleep: about    8 interrupted some  Drug use: no  PAP: 2014  Dr Nolen Mu  And deb young   ROS:  GEN/ HEENT: No fever, significant weight changesvision problems hearing changes, CV/ PULM; No chest pain shortness of breath cough, syncope,edema  change in exercise tolerance. GI /GU: No adominal pain, vomiting, change in bowel habits. No blood in the stool. No significant GU symptoms. SKIN/HEME: ,no acute skin rashes suspicious lesions or bleeding. No lymphadenopathy, nodules, masses.  NEURO/ PSYCH:  No neurologic signs such as weakness numbness. No depression anxiety. IMM/ Allergy: No unusual infections.  Allergy .   REST of 12 system review negative except as per HPI   Past Medical History  Diagnosis Date  . ALCOHOL USE 07/22/2009  . ATTENTION DEFICIT DISORDER 04/28/2007     hx of speech therapy  . EXERCISE INDUCED ASTHMA 04/28/2007  . MUCOSITIS ULCERATIVE OF CERVIX VAGINA AND VULVA 08/08/2008    herpes  . PRIMARY DYSMENORRHEA  04/28/2007  . ACNE VULGARIS 04/28/2007  . POSTURAL LIGHTHEADEDNESS 04/28/2007  . Rash and other nonspecific skin eruption 07/22/2009  . Dysuria 08/08/2008  . Abdominal pain, generalized 08/08/2008  . History of pyelonephritis     acute right  4 2011  . Alcohol abuse, in remission      hx of amnesia   . HSV (herpes simplex virus) anogenital infection     hx of same  . Anxiety   . Depression   . Chronic kidney disease   . Bipolar disorder     No past surgical history on file.  Family History  Problem Relation Age of Onset  . Heart attack Mother   . Asthma    . Diabetes type II    . Alcohol abuse Paternal Grandmother   . Depression Paternal Grandmother     History   Social History  . Marital Status: Single    Spouse Name: N/A  . Number of Children: N/A  . Years of Education: N/A   Social History Main Topics  . Smoking status: Former Smoker -- 0.30 packs/day    Types: Cigarettes  . Smokeless tobacco: Not on file     Comment: per pt 4 cigarettes a day   . Alcohol Use: Yes  . Drug Use: No     Comment: In the past- Pot 3 months ago  . Sexual Activity: Yes  Birth Control/ Protection: Pill   Other Topics Concern  . None   Social History Narrative   [employer] history  And english  Dropped out temporarily getting psych help  On line courses  In past    No ets    [employer]    Volunteered  [employer]   Limited etoh   Stopped tobacco( now 2-3 x per week)   Working [employer] 15- 20 hours per week.   [employer] to finish school [employer]    Outpatient Prescriptions Prior to Visit  Medication Sig Dispense Refill  . albuterol (PROAIR HFA) 108 (90 BASE) MCG/ACT inhaler Inhale 2 puffs into the lungs every 6 (six) hours as needed for wheezing or shortness of breath. 1 Inhaler 2  . fluticasone (FLONASE) 50 MCG/ACT nasal spray Place 2 sprays into both nostrils daily. 16 g 2  . HYPERCARE 20 % external solution Apply externally to skin daily at bedtime 35 mL 0  . [facility]  1-35 MG-MCG tablet TAKE 1 TABLET BY MOUTH ONCE A DAY. SKIP LAST WEEK AND CONTINUE 28 tablet 1  . lamoTRIgine (LAMICTAL) 100 MG tablet Take 100 mg by mouth daily.     Marland Kitchen. lisdexamfetamine (VYVANSE) 50 MG capsule Take 100 mg by mouth every morning.    . valACYclovir (VALTREX) 500 MG tablet Take 500 mg by mouth daily as needed (outbreak.).    Marland Kitchen. BuPROPion HCl ER, XL, (FORFIVO XL) 450 MG TB24 Take 1 tablet by mouth daily.     No facility-administered medications prior to visit.   Med list not accurate and pt to send in current  meds list   EXAM:  BP 106/72 mmHg  Temp(Src) 99 F (37.2 C) (Oral)  Ht 5' 4.75" (1.645 m)  Wt 110 lb (49.896 kg)  BMI 18.44 kg/m2  Body mass index is 18.44 kg/(m^2).  Physical Exam: Vital signs reviewed ZOX:WRUEGEN:This is a well-developed well-nourished alert cooperative    who appearsr stated age in no acute distress.  HEENT: normocephalic atraumatic , Eyes: PERRL EOM's full, conjunctiva clear, Nares: paten,t no deformity discharge or tenderness., Ears: no deformity EAC's clear TMs with normal landmarks. Mouth: clear OP, no lesions, edema.  Moist mucous membranes. Dentition in adequate repair. NECK: supple without masses, thyromegaly or bruits. CHEST/PULM:  Clear to auscultation and percussion breath sounds equal no wheeze , rales or rhonchi. No chest wall deformities or tenderness. Breast look normal lumpiness bilateral left more than right cytic feeling upper left  Right lump about 1 cm?  CV: PMI is nondisplaced, S1 S2 no gallops, murmurs, rubs. Peripheral pulses are full without delay.No JVD .  ABDOMEN: Bowel sounds normal nontender  No guard or rebound, no hepato splenomegal no CVA tenderness.  No hernia. Extremtities:  No clubbing cyanosis or edema, no acute joint swelling or redness no focal atrophy NEURO:  Oriented x3, cranial nerves 3-12 appear to be intact, no obvious focal weakness,gait within normal limits no abnormal reflexes or asymmetrical SKIN: No acute  rashes normal turgor, color, no bruising or petechiae. PSYCH: Oriented, good eye contact, no obvious depression anxiety, cognition and judgment appear normal. LN: no cervical axillary inguinal adenopathy  Lab Results  Component Value Date   WBC 5.7 07/10/2014   HGB 13.9 07/10/2014   HCT 40.1 07/10/2014   PLT 238.0 07/10/2014   GLUCOSE 90 07/10/2014   CHOL 171 07/10/2014   TRIG 90.0 07/10/2014   HDL 53.40 07/10/2014   LDLDIRECT 116.0 04/27/2011   LDLCALC 100* 07/10/2014   ALT  16 07/10/2014   AST 12 07/10/2014   NA 137 07/10/2014   K 4.4 07/10/2014   CL 107 07/10/2014   CREATININE 0.83 07/10/2014   BUN 15 07/10/2014   CO2 27 07/10/2014   TSH 0.49 07/10/2014    ASSESSMENT AND PLAN:  Discussed the following assessment and plan:  Visit for preventive health examination - dsic no tob limit etoh healthy eating and ls   Medication management  Urinary frequency - r/o infection - Plan: POC Urinalysis Dipstick  Routine screening for STI (sexually transmitted infection) - Plan: GC/chlamydia probe amp, urine  History of migraine headaches - tack and disussed managment and fu  prob FA followed ultrasound ,  has lumpy breast on left also She will get [place] an updated medication list. Check urine today let her know if infected. Patient Care Team: Madelin Headings, MD as PCP - General Andee Poles, MD as Consulting Physician (Psychiatry) Patient Instructions   lifestyle intervention healthy eating and exercise .  Healthy lifestyle includes : At least 150 minutes of exercise weeks  , weight at healthy levels, which is usually   BMI 19-25. Headaches, Frequently Asked Questions MIGRAINE HEADACHES Q: What is migraine? What causes it? How can I treat it? A: Generally, migraine headaches begin as a dull ache. Then they develop into a constant, throbbing, and pulsating pain. You may experience pain at the temples. You may experience pain at the front or back of one or both sides of the  head. The pain is usually accompanied by a combination of:  Nausea.  Vomiting.  Sensitivity to light and noise. Some people (about 15%) experience an aura (see below) before an attack. The cause of migraine is believed to be chemical reactions in the brain. Treatment for migraine may include over-the-counter or prescription medications. It may also include self-help techniques. These include relaxation training and biofeedback.  Q: What is an aura? A: About 15% of people with migraine get an "aura". This is a sign of neurological symptoms that occur before a migraine headache. You may see wavy or jagged lines, dots, or flashing lights. You might experience tunnel vision or blind spots in one or both eyes. The aura can include visual or auditory hallucinations (something imagined). It may include disruptions in smell (such as strange odors), taste or touch. Other symptoms include:  Numbness.  A "pins and needles" sensation.  Difficulty in recalling or speaking the correct word. These neurological events may last as long as 60 minutes. These symptoms will fade as the headache begins. Q: What is a trigger? A: Certain physical or environmental factors can lead to or "trigger" a migraine. These include:  Foods.  Hormonal changes.  Weather.  Stress. It is important to remember that triggers are different for everyone. To help prevent migraine attacks, you need to figure out which triggers affect you. Keep a headache diary. This is a good way to track triggers. The diary will help you talk to your healthcare professional about your condition. Q: Does weather affect migraines? A: Bright sunshine, hot, humid conditions, and drastic changes in barometric pressure may lead to, or "trigger," a migraine attack in some people. But studies have shown that weather does not act as a trigger for everyone with migraines. Q: What is the link between migraine and hormones? A: Hormones start and regulate  many of your body's functions. Hormones keep your body in balance within a constantly changing environment. The levels of hormones in your body are unbalanced at  times. Examples are during menstruation, pregnancy, or menopause. That can lead to a migraine attack. In fact, about three quarters of all women with migraine report that their attacks are related to the menstrual cycle.  Q: Is there an increased risk of stroke for migraine sufferers? A: The likelihood of a migraine attack causing a stroke is very remote. That is not to say that migraine sufferers cannot have a stroke associated with their migraines. In persons under age 68, the most common associated factor for stroke is migraine headache. But over the course of a person's normal life span, the occurrence of migraine headache may actually be associated with a reduced risk of dying from cerebrovascular disease due to stroke.  Q: What are acute medications for migraine? A: Acute medications are used to treat the pain of the headache after it has started. Examples over-the-counter medications, NSAIDs, ergots, and triptans.  Q: What are the triptans? A: Triptans are the newest class of abortive medications. They are specifically targeted to treat migraine. Triptans are vasoconstrictors. They moderate some chemical reactions in the brain. The triptans work on receptors in your brain. Triptans help to restore the balance of a neurotransmitter called serotonin. Fluctuations in levels of serotonin are thought to be a main cause of migraine.  Q: Are over-the-counter medications for migraine effective? A: Over-the-counter, or "OTC," medications may be effective in relieving mild to moderate pain and associated symptoms of migraine. But you should see your caregiver before beginning any treatment regimen for migraine.  Q: What are preventive medications for migraine? A: Preventive medications for migraine are sometimes referred to as "prophylactic"  treatments. They are used to reduce the frequency, severity, and length of migraine attacks. Examples of preventive medications include antiepileptic medications, antidepressants, beta-blockers, calcium channel blockers, and NSAIDs (nonsteroidal anti-inflammatory drugs). Q: Why are anticonvulsants used to treat migraine? A: During the past few years, there has been an increased interest in antiepileptic drugs for the prevention of migraine. They are sometimes referred to as "anticonvulsants". Both epilepsy and migraine may be caused by similar reactions in the brain.  Q: Why are antidepressants used to treat migraine? A: Antidepressants are typically used to treat people with depression. They may reduce migraine frequency by regulating chemical levels, such as serotonin, in the brain.  Q: What alternative therapies are used to treat migraine? A: The term "alternative therapies" is often used to describe treatments considered outside the scope of conventional Western medicine. Examples of alternative therapy include acupuncture, acupressure, and yoga. Another common alternative treatment is herbal therapy. Some herbs are believed to relieve headache pain. Always discuss alternative therapies with your caregiver before proceeding. Some herbal products contain arsenic and other toxins. TENSION HEADACHES Q: What is a tension-type headache? What causes it? How can I treat it? A: Tension-type headaches occur randomly. They are often the result of temporary stress, anxiety, fatigue, or anger. Symptoms include soreness in your temples, a tightening band-like sensation around your head (a "vice-like" ache). Symptoms can also include a pulling feeling, pressure sensations, and contracting head and neck muscles. The headache begins in your forehead, temples, or the back of your head and neck. Treatment for tension-type headache may include over-the-counter or prescription medications. Treatment may also include  self-help techniques such as relaxation training and biofeedback. CLUSTER HEADACHES Q: What is a cluster headache? What causes it? How can I treat it? A: Cluster headache gets its name because the attacks come in groups. The pain arrives with little, if any, warning.  It is usually on one side of the head. A tearing or bloodshot eye and a runny nose on the same side of the headache may also accompany the pain. Cluster headaches are believed to be caused by chemical reactions in the brain. They have been described as the most severe and intense of any headache type. Treatment for cluster headache includes prescription medication and oxygen. SINUS HEADACHES Q: What is a sinus headache? What causes it? How can I treat it? A: When a cavity in the bones of the face and skull (a sinus) becomes inflamed, the inflammation will cause localized pain. This condition is usually the result of an allergic reaction, a tumor, or an infection. If your headache is caused by a sinus blockage, such as an infection, you will probably have a fever. An x-ray will confirm a sinus blockage. Your caregiver's treatment might include antibiotics for the infection, as well as antihistamines or decongestants.  REBOUND HEADACHES Q: What is a rebound headache? What causes it? How can I treat it? A: A pattern of taking acute headache medications too often can lead to a condition known as "rebound headache." A pattern of taking too much headache medication includes taking it more than 2 days per week or in excessive amounts. That means more than the label or a caregiver advises. With rebound headaches, your medications not only stop relieving pain, they actually begin to cause headaches. Doctors treat rebound headache by tapering the medication that is being overused. Sometimes your caregiver will gradually substitute a different type of treatment or medication. Stopping may be a challenge. Regularly overusing a medication increases the  potential for serious side effects. Consult a caregiver if you regularly use headache medications more than 2 days per week or more than the label advises. ADDITIONAL QUESTIONS AND ANSWERS Q: What is biofeedback? A: Biofeedback is a self-help treatment. Biofeedback uses special equipment to monitor your body's involuntary physical responses. Biofeedback monitors:  Breathing.  Pulse.  Heart rate.  Temperature.  Muscle tension.  Brain activity. Biofeedback helps you refine and perfect your relaxation exercises. You learn to control the physical responses that are related to stress. Once the technique has been mastered, you do not need the equipment any more. Q: Are headaches hereditary? A: Four out of five (80%) of people that suffer report a family history of migraine. Scientists are not sure if this is genetic or a family predisposition. Despite the uncertainty, a child has a 50% chance of having migraine if one parent suffers. The child has a 75% chance if both parents suffer.  Q: Can children get headaches? A: By the time they reach high school, most young people have experienced some type of headache. Many safe and effective approaches or medications can prevent a headache from occurring or stop it after it has begun.  Q: What type of doctor should I see to diagnose and treat my headache? A: Start with your primary caregiver. Discuss his or her experience and approach to headaches. Discuss methods of classification, diagnosis, and treatment. Your caregiver may decide to recommend you to a headache specialist, depending upon your symptoms or other physical conditions. Having diabetes, allergies, etc., may require a more comprehensive and inclusive approach to your headache. The National Headache Foundation will provide, upon request, a list of [facility] physician members in your state. Document Released: 04/24/2003 Document Revised: 04/26/2011 Document Reviewed: 10/02/2007 [facility] Patient  Information 2015 [place], [place]. This information is not intended to replace advice given to you by  your health care provider. Make sure you discuss any questions you have with your health care provider.  Avoid trans fats and processed foods;  Increase fresh fruits and veges to 5 servings per day. And avoid sweet beverages including tea and juice. Mediterranean diet with olive oil and nuts have been noted to be heart and brain healthy . Avoid tobacco products . Limit  alcohol to  7 per week for women and 14 servings for men.  Get adequate sleep . Wear seat belts . Don't text and drive .   UA today .  Monitor migraine  Calendar  Headaches  To help control then  For triggers      limit Excedrin to avoid rebound.   2 - 2.5 aleve  At onset of headache may be helpful.  Fu if increasing migraines      Neta Mends. Panosh M.D.

## 2014-07-17 NOTE — Patient Instructions (Addendum)
lifestyle intervention healthy eating and exercise .  Healthy lifestyle includes : At least 150 minutes of exercise weeks  , weight at healthy levels, which is usually   BMI 19-25. Headaches, Frequently Asked Questions MIGRAINE HEADACHES Q: What is migraine? What causes it? How can I treat it? A: Generally, migraine headaches begin as a dull ache. Then they develop into a constant, throbbing, and pulsating pain. You may experience pain at the temples. You may experience pain at the front or back of one or both sides of the head. The pain is usually accompanied by a combination of:  Nausea.  Vomiting.  Sensitivity to light and noise. Some people (about 15%) experience an aura (see below) before an attack. The cause of migraine is believed to be chemical reactions in the brain. Treatment for migraine may include over-the-counter or prescription medications. It may also include self-help techniques. These include relaxation training and biofeedback.  Q: What is an aura? A: About 15% of people with migraine get an "aura". This is a sign of neurological symptoms that occur before a migraine headache. You may see wavy or jagged lines, dots, or flashing lights. You might experience tunnel vision or blind spots in one or both eyes. The aura can include visual or auditory hallucinations (something imagined). It may include disruptions in smell (such as strange odors), taste or touch. Other symptoms include:  Numbness.  A "pins and needles" sensation.  Difficulty in recalling or speaking the correct word. These neurological events may last as long as 60 minutes. These symptoms will fade as the headache begins. Q: What is a trigger? A: Certain physical or environmental factors can lead to or "trigger" a migraine. These include:  Foods.  Hormonal changes.  Weather.  Stress. It is important to remember that triggers are different for everyone. To help prevent migraine attacks, you need to  figure out which triggers affect you. Keep a headache diary. This is a good way to track triggers. The diary will help you talk to your healthcare professional about your condition. Q: Does weather affect migraines? A: Bright sunshine, hot, humid conditions, and drastic changes in barometric pressure may lead to, or "trigger," a migraine attack in some people. But studies have shown that weather does not act as a trigger for everyone with migraines. Q: What is the link between migraine and hormones? A: Hormones start and regulate many of your body's functions. Hormones keep your body in balance within a constantly changing environment. The levels of hormones in your body are unbalanced at times. Examples are during menstruation, pregnancy, or menopause. That can lead to a migraine attack. In fact, about three quarters of all women with migraine report that their attacks are related to the menstrual cycle.  Q: Is there an increased risk of stroke for migraine sufferers? A: The likelihood of a migraine attack causing a stroke is very remote. That is not to say that migraine sufferers cannot have a stroke associated with their migraines. In persons under age 65, the most common associated factor for stroke is migraine headache. But over the course of a person's normal life span, the occurrence of migraine headache may actually be associated with a reduced risk of dying from cerebrovascular disease due to stroke.  Q: What are acute medications for migraine? A: Acute medications are used to treat the pain of the headache after it has started. Examples over-the-counter medications, NSAIDs, ergots, and triptans.  Q: What are the triptans? A: Triptans are the newest  class of abortive medications. They are specifically targeted to treat migraine. Triptans are vasoconstrictors. They moderate some chemical reactions in the brain. The triptans work on receptors in your brain. Triptans help to restore the balance of a  neurotransmitter called serotonin. Fluctuations in levels of serotonin are thought to be a main cause of migraine.  Q: Are over-the-counter medications for migraine effective? A: Over-the-counter, or "OTC," medications may be effective in relieving mild to moderate pain and associated symptoms of migraine. But you should see your caregiver before beginning any treatment regimen for migraine.  Q: What are preventive medications for migraine? A: Preventive medications for migraine are sometimes referred to as "prophylactic" treatments. They are used to reduce the frequency, severity, and length of migraine attacks. Examples of preventive medications include antiepileptic medications, antidepressants, beta-blockers, calcium channel blockers, and NSAIDs (nonsteroidal anti-inflammatory drugs). Q: Why are anticonvulsants used to treat migraine? A: During the past few years, there has been an increased interest in antiepileptic drugs for the prevention of migraine. They are sometimes referred to as "anticonvulsants". Both epilepsy and migraine may be caused by similar reactions in the brain.  Q: Why are antidepressants used to treat migraine? A: Antidepressants are typically used to treat people with depression. They may reduce migraine frequency by regulating chemical levels, such as serotonin, in the brain.  Q: What alternative therapies are used to treat migraine? A: The term "alternative therapies" is often used to describe treatments considered outside the scope of conventional Western medicine. Examples of alternative therapy include acupuncture, acupressure, and yoga. Another common alternative treatment is herbal therapy. Some herbs are believed to relieve headache pain. Always discuss alternative therapies with your caregiver before proceeding. Some herbal products contain arsenic and other toxins. TENSION HEADACHES Q: What is a tension-type headache? What causes it? How can I treat it? A:  Tension-type headaches occur randomly. They are often the result of temporary stress, anxiety, fatigue, or anger. Symptoms include soreness in your temples, a tightening band-like sensation around your head (a "vice-like" ache). Symptoms can also include a pulling feeling, pressure sensations, and contracting head and neck muscles. The headache begins in your forehead, temples, or the back of your head and neck. Treatment for tension-type headache may include over-the-counter or prescription medications. Treatment may also include self-help techniques such as relaxation training and biofeedback. CLUSTER HEADACHES Q: What is a cluster headache? What causes it? How can I treat it? A: Cluster headache gets its name because the attacks come in groups. The pain arrives with little, if any, warning. It is usually on one side of the head. A tearing or bloodshot eye and a runny nose on the same side of the headache may also accompany the pain. Cluster headaches are believed to be caused by chemical reactions in the brain. They have been described as the most severe and intense of any headache type. Treatment for cluster headache includes prescription medication and oxygen. SINUS HEADACHES Q: What is a sinus headache? What causes it? How can I treat it? A: When a cavity in the bones of the face and skull (a sinus) becomes inflamed, the inflammation will cause localized pain. This condition is usually the result of an allergic reaction, a tumor, or an infection. If your headache is caused by a sinus blockage, such as an infection, you will probably have a fever. An x-ray will confirm a sinus blockage. Your caregiver's treatment might include antibiotics for the infection, as well as antihistamines or decongestants.  REBOUND HEADACHES Q:  What is a rebound headache? What causes it? How can I treat it? A: A pattern of taking acute headache medications too often can lead to a condition known as "rebound headache." A  pattern of taking too much headache medication includes taking it more than 2 days per week or in excessive amounts. That means more than the label or a caregiver advises. With rebound headaches, your medications not only stop relieving pain, they actually begin to cause headaches. Doctors treat rebound headache by tapering the medication that is being overused. Sometimes your caregiver will gradually substitute a different type of treatment or medication. Stopping may be a challenge. Regularly overusing a medication increases the potential for serious side effects. Consult a caregiver if you regularly use headache medications more than 2 days per week or more than the label advises. ADDITIONAL QUESTIONS AND ANSWERS Q: What is biofeedback? A: Biofeedback is a self-help treatment. Biofeedback uses special equipment to monitor your body's involuntary physical responses. Biofeedback monitors:  Breathing.  Pulse.  Heart rate.  Temperature.  Muscle tension.  Brain activity. Biofeedback helps you refine and perfect your relaxation exercises. You learn to control the physical responses that are related to stress. Once the technique has been mastered, you do not need the equipment any more. Q: Are headaches hereditary? A: Four out of five (80%) of people that suffer report a family history of migraine. Scientists are not sure if this is genetic or a family predisposition. Despite the uncertainty, a child has a 50% chance of having migraine if one parent suffers. The child has a 75% chance if both parents suffer.  Q: Can children get headaches? A: By the time they reach high school, most young people have experienced some type of headache. Many safe and effective approaches or medications can prevent a headache from occurring or stop it after it has begun.  Q: What type of doctor should I see to diagnose and treat my headache? A: Start with your primary caregiver. Discuss his or her experience and  approach to headaches. Discuss methods of classification, diagnosis, and treatment. Your caregiver may decide to recommend you to a headache specialist, depending upon your symptoms or other physical conditions. Having diabetes, allergies, etc., may require a more comprehensive and inclusive approach to your headache. The National Headache Foundation will provide, upon request, a list of [facility] physician members in your state. Document Released: 04/24/2003 Document Revised: 04/26/2011 Document Reviewed: 10/02/2007 [facility] Patient Information 2015 [employer], [place]. This information is not intended to replace advice given to you by your health care provider. Make sure you discuss any questions you have with your health care provider.  Avoid trans fats and processed foods;  Increase fresh fruits and veges to 5 servings per day. And avoid sweet beverages including tea and juice. Mediterranean diet with olive oil and nuts have been noted to be heart and brain healthy . Avoid tobacco products . Limit  alcohol to  7 per week for women and 14 servings for men.  Get adequate sleep . Wear seat belts . Don't text and drive .   UA today .  Monitor migraine  Calendar  Headaches  To help control then  For triggers      limit Excedrin to avoid rebound.   2 - 2.5 aleve  At onset of headache may be helpful.  Fu if increasing migraines  Get us updated med list

## 2014-07-19 LAB — GC/CHLAMYDIA PROBE AMP, URINE
Chlamydia, Swab/Urine, PCR: NEGATIVE
GC Probe Amp, Urine: NEGATIVE

## 2014-08-13 ENCOUNTER — Encounter: Payer: Self-pay | Admitting: Internal Medicine

## 2014-08-26 ENCOUNTER — Other Ambulatory Visit: Payer: Self-pay | Admitting: Internal Medicine

## 2014-08-26 NOTE — Telephone Encounter (Signed)
Sent to the pharmacy by e-scribe. 

## 2014-12-06 ENCOUNTER — Ambulatory Visit (INDEPENDENT_AMBULATORY_CARE_PROVIDER_SITE_OTHER): Payer: BLUE CROSS/BLUE SHIELD | Admitting: Family Medicine

## 2014-12-06 DIAGNOSIS — Z23 Encounter for immunization: Secondary | ICD-10-CM

## 2015-03-30 ENCOUNTER — Encounter: Payer: Self-pay | Admitting: Internal Medicine

## 2015-04-01 MED ORDER — ETHYNODIOL DIAC-ETH ESTRADIOL 1-35 MG-MCG PO TABS: ORAL_TABLET | ORAL | Status: DC

## 2015-04-01 NOTE — Telephone Encounter (Signed)
Sent to the pharmacy by e-scribe. 

## 2015-06-04 DIAGNOSIS — F3132 Bipolar disorder, current episode depressed, moderate: Secondary | ICD-10-CM | POA: Diagnosis not present

## 2015-08-28 DIAGNOSIS — F3132 Bipolar disorder, current episode depressed, moderate: Secondary | ICD-10-CM | POA: Diagnosis not present

## 2015-10-06 ENCOUNTER — Other Ambulatory Visit: Payer: Self-pay | Admitting: Internal Medicine

## 2015-10-08 ENCOUNTER — Telehealth: Payer: Self-pay | Admitting: Family Medicine

## 2015-10-08 ENCOUNTER — Other Ambulatory Visit: Payer: Self-pay | Admitting: Family Medicine

## 2015-10-08 DIAGNOSIS — Z0000 Encounter for general adult medical examination without abnormal findings: Secondary | ICD-10-CM

## 2015-10-08 NOTE — Telephone Encounter (Signed)
Pt is past due for cpx and lab work.  I have placed the lab orders.  Please help the pt to make both appointments.  Thanks!!!

## 2015-10-08 NOTE — Telephone Encounter (Signed)
Rx refill sent to pharmacy. 

## 2015-10-08 NOTE — Telephone Encounter (Signed)
Sent to the pharmacy by e-scribe.  Pt now past due for cpx.  Message sent to scheduling.

## 2015-10-08 NOTE — Telephone Encounter (Signed)
lmom for pt to call back

## 2015-10-10 NOTE — Telephone Encounter (Signed)
lmom for pt to call back

## 2015-10-14 NOTE — Telephone Encounter (Signed)
lmom for pt to call back

## 2015-11-20 ENCOUNTER — Ambulatory Visit (INDEPENDENT_AMBULATORY_CARE_PROVIDER_SITE_OTHER): Payer: BLUE CROSS/BLUE SHIELD | Admitting: *Deleted

## 2015-11-20 DIAGNOSIS — Z23 Encounter for immunization: Secondary | ICD-10-CM

## 2015-12-10 DIAGNOSIS — F3132 Bipolar disorder, current episode depressed, moderate: Secondary | ICD-10-CM | POA: Diagnosis not present

## 2015-12-23 DIAGNOSIS — L700 Acne vulgaris: Secondary | ICD-10-CM | POA: Diagnosis not present

## 2016-02-04 ENCOUNTER — Other Ambulatory Visit: Payer: Self-pay | Admitting: Internal Medicine

## 2016-02-04 NOTE — Telephone Encounter (Signed)
Denied.  Message sent to the pharmacy to have pt call and make an appointment.  Not seen since 07/17/2014.

## 2016-02-05 ENCOUNTER — Encounter: Payer: Self-pay | Admitting: Internal Medicine

## 2016-02-05 ENCOUNTER — Telehealth: Payer: Self-pay | Admitting: Internal Medicine

## 2016-02-05 NOTE — Telephone Encounter (Signed)
Pt has sent a mychart message and needs cpe before the end of the yr.  Pt not seen since 07/17/2014 and needs her BC pills. Please advise.

## 2016-02-05 NOTE — Telephone Encounter (Signed)
° ° ° °  Pt [facility] of the following:   [facility]G [facility]  Pt was told she need an appt. He insurance is changing next year and would like to come in before her insurance expires. Can we use a SDA for med fup   Phamacy:

## 2016-02-06 NOTE — Telephone Encounter (Signed)
Ov ok next  Friday?

## 2016-02-10 NOTE — Telephone Encounter (Signed)
Left several messages on phone and sent mychart messages to confirm appt for Friday. Hopefully will hear from pt.

## 2016-02-10 NOTE — Telephone Encounter (Signed)
Sorry to bother you again, but hope you receied my message about appointment for this Friday at 2 pm with Dr Fabian Sharppanosh. Please let me know, thanks!

## 2016-02-11 DIAGNOSIS — F3132 Bipolar disorder, current episode depressed, moderate: Secondary | ICD-10-CM | POA: Diagnosis not present

## 2016-02-13 ENCOUNTER — Ambulatory Visit (INDEPENDENT_AMBULATORY_CARE_PROVIDER_SITE_OTHER): Payer: BLUE CROSS/BLUE SHIELD | Admitting: Internal Medicine

## 2016-02-13 ENCOUNTER — Encounter: Payer: Self-pay | Admitting: Internal Medicine

## 2016-02-13 VITALS — BP 122/80 | Temp 98.3°F | Wt 115.4 lb

## 2016-02-13 DIAGNOSIS — Z3041 Encounter for surveillance of contraceptive pills: Secondary | ICD-10-CM

## 2016-02-13 DIAGNOSIS — Z79899 Other long term (current) drug therapy: Secondary | ICD-10-CM

## 2016-02-13 DIAGNOSIS — N946 Dysmenorrhea, unspecified: Secondary | ICD-10-CM | POA: Diagnosis not present

## 2016-02-13 MED ORDER — ETHYNODIOL DIAC-ETH ESTRADIOL 1-35 MG-MCG PO TABS: ORAL_TABLET | ORAL | 0 refills | Status: DC

## 2016-02-13 MED ORDER — ETHYNODIOL DIAC-ETH ESTRADIOL 1-35 MG-MCG PO TABS
ORAL_TABLET | ORAL | 2 refills | Status: DC
Start: 2016-02-13 — End: 2016-02-13

## 2016-02-13 NOTE — Progress Notes (Signed)
Pre visit review using our clinic review tool, if applicable. No additional management support is needed unless otherwise documented below in the visit note.  Chief Complaint  Patient presents with  . Follow-up    Med Check    HPI: Alexis [name] 25 y.o. Alexis [name] Last  Ov  6 16 for  cpx   Needs med check before end of year  Is on ocps continuous therapy   Work in [employer] .  Ft  Taking  ocps  Continuously       q 3 month so and can adjust .and helps pattern and cramps  Psych   Changing  pre scriber  Dr Nolen Mumckinney retire ing  Sleep:  Fairly well   6 hours  .  caffiene  ocass  Etoh.   Couple at a time  Some tobacco  Almost quit.   Denies RD  School [employer] interested in becoming flight attendant  ROS: See pertinent positives and negatives per HPI. No cp sob syncope   Past Medical History:  Diagnosis Date  . Abdominal pain, generalized 08/08/2008  . ACNE VULGARIS 04/28/2007  . Alcohol abuse, in remission     hx of amnesia   . ALCOHOL USE 07/22/2009  . Anxiety   . ATTENTION DEFICIT DISORDER 04/28/2007    hx of speech therapy  . Bipolar disorder (HCC)   . Chronic kidney disease   . Depression   . Dysuria 08/08/2008  . EXERCISE INDUCED ASTHMA 04/28/2007  . History of pyelonephritis    acute right  4 2011  . HSV (herpes simplex virus) anogenital infection    hx of same  . MUCOSITIS ULCERATIVE OF CERVIX VAGINA AND VULVA 08/08/2008   herpes  . POSTURAL LIGHTHEADEDNESS 04/28/2007  . PRIMARY DYSMENORRHEA 04/28/2007  . Rash and other nonspecific skin eruption 07/22/2009    Family History  Problem Relation Age of Onset  . Heart attack Mother   . Asthma    . Diabetes type II    . Alcohol abuse Paternal Grandmother   . Depression Paternal Grandmother     Social History   Social History  . Marital status: Single    Spouse name: N/A  . Number of children: N/A  . Years of education: N/A   Social History Main Topics  . Smoking status: Former Smoker    Packs/day: 0.30    Types:  Cigarettes  . Smokeless tobacco: None     Comment: per pt 4 cigarettes a day   . Alcohol use Yes  . Drug use: No     Comment: In the past- Pot 3 months ago  . Sexual activity: Yes    Birth control/ protection: Pill   Other Topics Concern  . None   Social History Narrative   [facility] history  And english  Dropped out temporarily getting psych help  On line courses  In past    No ets    [employer]    Volunteered  [employer]   Limited etoh   Stopped tobacco( now 2-3 x per week)   Working [employer] 15- 20 hours per week.   [employer] to finish school [employer]    Outpatient Medications Prior to Visit  Medication Sig Dispense Refill  . albuterol (PROAIR HFA) 108 (90 BASE) MCG/ACT inhaler Inhale 2 puffs into the lungs every 6 (six) hours as needed for wheezing or shortness of breath. 1 Inhaler 2  . buPROPion (WELLBUTRIN XL) 300 MG 24 hr tablet     .  lamoTRIgine (LAMICTAL) 100 MG tablet Take 100 mg by mouth daily.     Marland Kitchen. lisdexamfetamine (VYVANSE) 50 MG capsule Take 100 mg by mouth every morning.    Marland Kitchen. [facility] 1-35 MG-MCG tablet TAKE 1 TABLET DAILY, SKIP LAST WEEK AND CONTINUE 112 tablet 0  . HYPERCARE 20 % external solution Apply externally to skin daily at bedtime (Patient not taking: Reported on 02/13/2016) 35 mL 0  . valACYclovir (VALTREX) 500 MG tablet Take 500 mg by mouth daily as needed (outbreak.).    Marland Kitchen. BuPROPion HCl ER, XL, (FORFIVO XL) 450 MG TB24 Take 1 tablet by mouth daily.    . fluticasone (FLONASE) 50 MCG/ACT nasal spray Place 2 sprays into both nostrils daily. 16 g 2  . MAGNESIUM PO Take 1 tablet by mouth daily.     No facility-administered medications prior to visit.      EXAM:  BP 122/80 (BP Location: Right Arm, Patient Position: Sitting, Cuff Size: Normal)   Temp 98.3 F (36.8 C) (Oral)   Wt 115 lb 6.4 oz (52.3 kg)   BMI 19.35 kg/m   Body mass index is 19.35 kg/m.  GENERAL: vitals reviewed and listed above, alert, oriented, appears well hydrated and  in no acute distress HEENT: atraumatic, conjunctiva  clear, no obvious abnormalities on inspection of external nose and ears OP : no lesion edema or exudate  NECK: no obvious masses on inspection palpation  LUNGS: clear to auscultation bilaterally, no wheezes, rales or rhonchi, good air movement Abdomen:  Sof,t normal bowel sounds without hepatosplenomegaly, no guarding rebound or masses no CVA tenderness CV: HRRR, no clubbing cyanosis or  peripheral edema nl cap refill  MS: moves all extremities without noticeable focal  abnormality PSYCH: pleasant and cooperative, no obvious depression or anxiety Lab Results  Component Value Date   WBC 5.7 07/10/2014   HGB 13.9 07/10/2014   HCT 40.1 07/10/2014   PLT 238.0 07/10/2014   GLUCOSE 90 07/10/2014   CHOL 171 07/10/2014   TRIG 90.0 07/10/2014   HDL 53.40 07/10/2014   LDLDIRECT 116.0 04/27/2011   LDLCALC 100 (H) 07/10/2014   ALT 16 07/10/2014   AST 12 07/10/2014   NA 137 07/10/2014   K 4.4 07/10/2014   CL 107 07/10/2014   CREATININE 0.83 07/10/2014   BUN 15 07/10/2014   CO2 27 07/10/2014   TSH 0.49 07/10/2014   BP Readings from Last 3 Encounters:  02/13/16 122/80  07/17/14 106/72  08/29/13 116/74   Wt Readings from Last 3 Encounters:  02/13/16 115 lb 6.4 oz (52.3 kg)  07/17/14 110 lb (49.9 kg)  08/29/13 127 lb (57.6 kg)    ASSESSMENT AND PLAN:  Discussed the following assessment and plan:  PRIMARY DYSMENORRHEA - controlled continuous ocps  continue  Medication management  Oral contraceptive use reviewed med continue  Due for pap  5 18  May have insurance change then    Stay tobacco free as discussed ( off 1 month)  Total visit 15mins > 50% spent counseling and coordinating care as indicated in above note and in instructions to patient .  -Patient advised to return or notify health care team  if symptoms worsen ,persist or new concerns arise.  Patient Instructions  cpx   well woman exam With pap smear   Next year  Due 5  18   But can do when helpful for your insurance .      Neta MendsWanda K. Hy Swiatek M.D.

## 2016-02-13 NOTE — Patient Instructions (Signed)
cpx   well woman exam With pap smear   Next year  Due 5 18   But can do when helpful for your insurance .

## 2016-04-07 DIAGNOSIS — F3132 Bipolar disorder, current episode depressed, moderate: Secondary | ICD-10-CM | POA: Diagnosis not present

## 2016-06-07 NOTE — Progress Notes (Deleted)
No chief complaint on file.   HPI: Patient  Alexis [name]  26 y.o. comes in today for Preventive Health Care visit   Health Maintenance  Topic Date Due  . PAP SMEAR  06/15/2016  . INFLUENZA VACCINE  09/15/2016  . TETANUS/TDAP  06/16/2023  . HIV Screening  Completed   Health Maintenance Review LIFESTYLE:  Exercise:   Tobacco/ETS: Alcohol:  Sugar beverages: Sleep: Drug use: no HH of  Work:    ROS:  GEN/ HEENT: No fever, significant weight changes sweats headaches vision problems hearing changes, CV/ PULM; No chest pain shortness of breath cough, syncope,edema  change in exercise tolerance. GI /GU: No adominal pain, vomiting, change in bowel habits. No blood in the stool. No significant GU symptoms. SKIN/HEME: ,no acute skin rashes suspicious lesions or bleeding. No lymphadenopathy, nodules, masses.  NEURO/ PSYCH:  No neurologic signs such as weakness numbness. No depression anxiety. IMM/ Allergy: No unusual infections.  Allergy .   REST of 12 system review negative except as per HPI   Past Medical History:  Diagnosis Date  . Abdominal pain, generalized 08/08/2008  . ACNE VULGARIS 04/28/2007  . Alcohol abuse, in remission     hx of amnesia   . ALCOHOL USE 07/22/2009  . Anxiety   . ATTENTION DEFICIT DISORDER 04/28/2007    hx of speech therapy  . Bipolar disorder (HCC)   . Chronic kidney disease   . Depression   . Dysuria 08/08/2008  . EXERCISE INDUCED ASTHMA 04/28/2007  . History of pyelonephritis    acute right  4 2011  . HSV (herpes simplex virus) anogenital infection    hx of same  . MUCOSITIS ULCERATIVE OF CERVIX VAGINA AND VULVA 08/08/2008   herpes  . POSTURAL LIGHTHEADEDNESS 04/28/2007  . PRIMARY DYSMENORRHEA 04/28/2007  . Rash and other nonspecific skin eruption 07/22/2009    No past surgical history on file.  Family History  Problem Relation Age of Onset  . Heart attack Mother   . Asthma    . Diabetes type II    . Alcohol abuse Paternal  Grandmother   . Depression Paternal Grandmother     Social History   Social History  . Marital status: Single    Spouse name: N/A  . Number of children: N/A  . Years of education: N/A   Social History Main Topics  . Smoking status: Former Smoker    Packs/day: 0.30    Types: Cigarettes  . Smokeless tobacco: Not on file     Comment: per pt 4 cigarettes a day   . Alcohol use Yes  . Drug use: No     Comment: In the past- Pot 3 months ago  . Sexual activity: Yes    Birth control/ protection: Pill   Other Topics Concern  . 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   [employer] to finish school [employer]    Outpatient Medications Prior to Visit  Medication Sig Dispense Refill  . albuterol (PROAIR HFA) 108 (90 BASE) MCG/ACT inhaler Inhale 2 puffs into the lungs every 6 (six) hours as needed for wheezing or shortness of breath. 1  Inhaler 2  . buPROPion (WELLBUTRIN XL) 300 MG 24 hr tablet     . ethynodiol-ethinyl estradiol (KELNOR 1/35) 1-35 MG-MCG tablet TAKE 1 TABLET DAILY, SKIP LAST WEEK AND CONTINUE 112 tablet 0  . HYPERCARE 20 % external solution Apply externally to skin daily at bedtime (Patient not taking: Reported on 02/13/2016) 35 mL 0  . lamoTRIgine (LAMICTAL) 100 MG tablet Take 100 mg by mouth daily.     Marland Kitchen lisdexamfetamine (VYVANSE) 50 MG capsule Take 100 mg by mouth every morning.    . Multiple Vitamin (MULTI-VITAMIN PO) Take by mouth.    . valACYclovir (VALTREX) 500 MG tablet Take 500 mg by mouth daily as needed (outbreak.).     No facility-administered medications prior to visit.      EXAM:  There were no vitals taken for this visit.  There is no height or weight on file to calculate BMI. Wt Readings from Last  3 Encounters:  02/13/16 115 lb 6.4 oz (52.3 kg)  07/17/14 110 lb (49.9 kg)  08/29/13 127 lb (57.6 kg)    Physical Exam: Vital signs reviewed ZOX:WRUE is a well-developed well-nourished alert cooperative    who appearsr stated age in no acute distress.  HEENT: normocephalic atraumatic , Eyes: PERRL EOM's full, conjunctiva clear, Nares: paten,t no deformity discharge or tenderness., Ears: no deformity EAC's clear TMs with normal landmarks. Mouth: clear OP, no lesions, edema.  Moist mucous membranes. Dentition in adequate repair. NECK: supple without masses, thyromegaly or bruits. CHEST/PULM:  Clear to auscultation and percussion breath sounds equal no wheeze , rales or rhonchi. No chest wall deformities or tenderness. Breast: normal by inspection . No dimpling, discharge, masses, tenderness or discharge . CV: PMI is nondisplaced, S1 S2 no gallops, murmurs, rubs. Peripheral pulses are full without delay.No JVD .  ABDOMEN: Bowel sounds normal nontender  No guard or rebound, no hepato splenomegal no CVA tenderness.  No hernia. Extremtities:  No clubbing cyanosis or edema, no acute joint swelling or redness no focal atrophy NEURO:  Oriented x3, cranial nerves 3-12 appear to be intact, no obvious focal weakness,gait within normal limits no abnormal reflexes or asymmetrical SKIN: No acute rashes normal turgor, color, no bruising or petechiae. PSYCH: Oriented, good eye contact, no obvious depression anxiety, cognition and judgment appear normal. LN: no cervical axillary inguinal adenopathy Pelvic: NL ext GU, labia clear without lesions or rash . Vagina no lesions .Cervix: clear  UTERUS: Neg CMT Adnexa:  clear no masses . PAP done   Lab Results  Component Value Date   WBC 5.7 07/10/2014   HGB 13.9 07/10/2014   HCT 40.1 07/10/2014   PLT 238.0 07/10/2014   GLUCOSE 90 07/10/2014   CHOL 171 07/10/2014   TRIG 90.0 07/10/2014   HDL 53.40 07/10/2014   LDLDIRECT 116.0 04/27/2011   LDLCALC 100 (H)  07/10/2014   ALT 16 07/10/2014   AST 12 07/10/2014   NA 137 07/10/2014   K 4.4 07/10/2014   CL 107 07/10/2014   CREATININE 0.83 07/10/2014   BUN 15 07/10/2014   CO2 27 07/10/2014   TSH 0.49 07/10/2014    BP Readings from Last 3 Encounters:  02/13/16 122/80  07/17/14 106/72  08/29/13 116/74    Lab results reviewed with patient   ASSESSMENT AND PLAN:  Discussed the following assessment and plan:  No diagnosis found.  Patient Care Team: Madelin Headings, MD as PCP - General Andee Poles, MD as Consulting Physician (Psychiatry) There are no Patient Instructions on file  for this visit.  Neta Mends. Langford Carias M.D.

## 2016-06-08 ENCOUNTER — Ambulatory Visit: Payer: BLUE CROSS/BLUE SHIELD | Admitting: Internal Medicine

## 2016-07-05 DIAGNOSIS — F3132 Bipolar disorder, current episode depressed, moderate: Secondary | ICD-10-CM | POA: Diagnosis not present

## 2016-07-05 DIAGNOSIS — F900 Attention-deficit hyperactivity disorder, predominantly inattentive type: Secondary | ICD-10-CM | POA: Diagnosis not present

## 2016-09-08 NOTE — Progress Notes (Signed)
Chief Complaint  Patient presents with  . Acute Visit    UTI    HPI: Alexis [name] 26 y.o. acute problem  Onset    Bad oder and some change in  Urinary voiding  For Couple of days. Non fever  no  Back pain   mom advised her to come in because she has a past history of serious pyelonephritis the percent fairly suddenly in the past and one required hospitalization. No significant vaginal symptoms no rix risk of STDs pregnancy last menstrual period about 2 weeks ago. Denies vaginal discharge.     Working    About  Under full time    Needs 1-2 coursed to [employer]  [employer] hx  ROS: See pertinent positives and negatives per HPI.  Past Medical History:  Diagnosis Date  . Abdominal pain, generalized 08/08/2008  . ACNE VULGARIS 04/28/2007  . Alcohol abuse, in remission     hx of amnesia   . ALCOHOL USE 07/22/2009  . Anxiety   . ATTENTION DEFICIT DISORDER 04/28/2007    hx of speech therapy  . Bipolar disorder (HCC)   . Chronic kidney disease   . Depression   . Dysuria 08/08/2008  . EXERCISE INDUCED ASTHMA 04/28/2007  . History of pyelonephritis    acute right  4 2011  . HSV (herpes simplex virus) anogenital infection    hx of same  . MUCOSITIS ULCERATIVE OF CERVIX VAGINA AND VULVA 08/08/2008   herpes  . POSTURAL LIGHTHEADEDNESS 04/28/2007  . PRIMARY DYSMENORRHEA 04/28/2007  . Rash and other nonspecific skin eruption 07/22/2009    Family History  Problem Relation Age of Onset  . Heart attack Mother   . Asthma Unknown   . Diabetes type II Unknown   . Alcohol abuse Paternal Grandmother   . Depression Paternal Grandmother     Social History   Social History  . Marital status: Single    Spouse name: N/A  . Number of children: N/A  . Years of education: N/A   Social History Main Topics  . Smoking status: Former Smoker    Packs/day: 0.30    Types: Cigarettes  . Smokeless tobacco: Never Used     Comment: per pt 4 cigarettes a day   . Alcohol use Yes  . Drug use: No      Comment: In the past- Pot 3 months ago  . Sexual activity: Yes    Birth control/ protection: Pill   Other Topics Concern  . None   Social History Narrative   [facility] history  And english  Dropped out temporarily getting psych help  On line courses  In past    No ets    [employer] 4    Volunteered  [employer]   Limited etoh   Stopped tobacco( now 2-3 x per week)   Working [employer] 15- 20 hours per week.   [employer] to finish school [employer]    Outpatient Medications Prior to Visit  Medication Sig Dispense Refill  . albuterol (PROAIR HFA) 108 (90 BASE) MCG/ACT inhaler Inhale 2 puffs into the lungs every 6 (six) hours as needed for wheezing or shortness of breath. 1 Inhaler 2  . buPROPion (WELLBUTRIN XL) 300 MG 24 hr tablet     . ethynodiol-ethinyl estradiol (KELNOR 1/35) 1-35 MG-MCG tablet TAKE 1 TABLET DAILY, SKIP LAST WEEK AND CONTINUE 112 tablet 0  . HYPERCARE 20 % external solution Apply externally to skin daily at bedtime 35 mL 0  .  lamoTRIgine (LAMICTAL) 100 MG tablet Take 100 mg by mouth daily.     Marland Kitchen. lisdexamfetamine (VYVANSE) 50 MG capsule Take 100 mg by mouth every morning.    . valACYclovir (VALTREX) 500 MG tablet Take 500 mg by mouth daily as needed (outbreak.).    Marland Kitchen. Multiple Vitamin (MULTI-VITAMIN PO) Take by mouth.     No facility-administered medications prior to visit.      EXAM:  BP 110/82 (BP Location: Right Arm, Patient Position: Sitting, Cuff Size: Normal)   Pulse 84   Temp 98.1 F (36.7 C) (Oral)   Wt 114 lb 4.8 oz (51.8 kg)   BMI 19.17 kg/m   Body mass index is 19.17 kg/m.  GENERAL: vitals reviewed and listed above, alert, oriented, appears well hydrated and in no acute distress HEENT: atraumatic, conjunctiva  clear, no obvious abnormalities on inspection of external nose and ears NECK: no obvious masses on inspection palpation  Abdomen:  Sof,t normal bowel sounds without hepatosplenomegaly, no guarding rebound or masses no CVA  tenderness CV: HRRR, no clubbing cyanosis or  peripheral edema nl cap refill  MS: moves all extremities without noticeable focal  abnormality PSYCH: pleasant and cooperative, no obvious depression or anxiety UA POCT clear   ASSESSMENT AND PLAN:  Discussed the following assessment and plan:  Frequency of urination - Plan: POCT Urinalysis Dipstick (Automated)  Abnormal urine odor - Plan: POCT Urinalysis Dipstick (Automated)  History of acute pyelonephritis Has history of significant hilar with minor symptoms prodrome. We'll culture urine treat accordingly get back with us if worse symptoms. -Patient advised to return or notify health care team  if symptoms worsen ,persist or new concerns arise.  Patient Instructions  Will culture    The  Urine .     Because of past history   Will let you know results when available.   Stay hydrated   If worse contact medical team .     Neta MendsWanda K. Arn Mcomber M.D.

## 2016-09-09 ENCOUNTER — Encounter: Payer: Self-pay | Admitting: Internal Medicine

## 2016-09-09 ENCOUNTER — Ambulatory Visit (INDEPENDENT_AMBULATORY_CARE_PROVIDER_SITE_OTHER): Payer: BLUE CROSS/BLUE SHIELD | Admitting: Internal Medicine

## 2016-09-09 VITALS — BP 110/82 | HR 84 | Temp 98.1°F | Wt 114.3 lb

## 2016-09-09 DIAGNOSIS — F3132 Bipolar disorder, current episode depressed, moderate: Secondary | ICD-10-CM | POA: Diagnosis not present

## 2016-09-09 DIAGNOSIS — Z87440 Personal history of urinary (tract) infections: Secondary | ICD-10-CM

## 2016-09-09 DIAGNOSIS — F900 Attention-deficit hyperactivity disorder, predominantly inattentive type: Secondary | ICD-10-CM | POA: Diagnosis not present

## 2016-09-09 DIAGNOSIS — R8290 Unspecified abnormal findings in urine: Secondary | ICD-10-CM | POA: Diagnosis not present

## 2016-09-09 DIAGNOSIS — R350 Frequency of micturition: Secondary | ICD-10-CM | POA: Diagnosis not present

## 2016-09-09 DIAGNOSIS — Z87448 Personal history of other diseases of urinary system: Secondary | ICD-10-CM

## 2016-09-09 LAB — POC URINALSYSI DIPSTICK (AUTOMATED)
Bilirubin, UA: NEGATIVE
Blood, UA: NEGATIVE
Glucose, UA: NEGATIVE
Ketones, UA: NEGATIVE
Leukocytes, UA: NEGATIVE
Nitrite, UA: NEGATIVE
Protein, UA: NEGATIVE
Spec Grav, UA: 1.02 (ref 1.010–1.025)
Urobilinogen, UA: 0.2 E.U./dL
pH, UA: 6 (ref 5.0–8.0)

## 2016-09-09 NOTE — Addendum Note (Signed)
Addended by: Dominic PeaHOLSEY, Gennesis Hogland V on: 09/09/2016 10:08 AM   Modules accepted: Orders

## 2016-09-09 NOTE — Patient Instructions (Addendum)
Will culture    The  Urine .     Because of past history   Will let you know results when available.   Stay hydrated   If worse contact medical team .

## 2016-09-10 LAB — URINE CULTURE: Organism ID, Bacteria: NO GROWTH

## 2016-09-17 DIAGNOSIS — F331 Major depressive disorder, recurrent, moderate: Secondary | ICD-10-CM | POA: Diagnosis not present

## 2016-09-23 DIAGNOSIS — F331 Major depressive disorder, recurrent, moderate: Secondary | ICD-10-CM | POA: Diagnosis not present

## 2016-10-06 DIAGNOSIS — F331 Major depressive disorder, recurrent, moderate: Secondary | ICD-10-CM | POA: Diagnosis not present

## 2016-10-15 DIAGNOSIS — F331 Major depressive disorder, recurrent, moderate: Secondary | ICD-10-CM | POA: Diagnosis not present

## 2016-10-21 DIAGNOSIS — F331 Major depressive disorder, recurrent, moderate: Secondary | ICD-10-CM | POA: Diagnosis not present

## 2016-10-26 DIAGNOSIS — F900 Attention-deficit hyperactivity disorder, predominantly inattentive type: Secondary | ICD-10-CM | POA: Diagnosis not present

## 2016-10-26 DIAGNOSIS — F3132 Bipolar disorder, current episode depressed, moderate: Secondary | ICD-10-CM | POA: Diagnosis not present

## 2016-11-04 DIAGNOSIS — F331 Major depressive disorder, recurrent, moderate: Secondary | ICD-10-CM | POA: Diagnosis not present

## 2016-11-05 ENCOUNTER — Encounter: Payer: Self-pay | Admitting: Internal Medicine

## 2016-12-03 ENCOUNTER — Other Ambulatory Visit: Payer: Self-pay | Admitting: Internal Medicine

## 2016-12-03 ENCOUNTER — Ambulatory Visit (INDEPENDENT_AMBULATORY_CARE_PROVIDER_SITE_OTHER): Payer: BLUE CROSS/BLUE SHIELD | Admitting: Internal Medicine

## 2016-12-03 ENCOUNTER — Encounter: Payer: Self-pay | Admitting: Internal Medicine

## 2016-12-03 VITALS — BP 108/78 | HR 86 | Temp 98.3°F | Wt 109.6 lb

## 2016-12-03 DIAGNOSIS — R300 Dysuria: Secondary | ICD-10-CM

## 2016-12-03 DIAGNOSIS — N3001 Acute cystitis with hematuria: Secondary | ICD-10-CM | POA: Diagnosis not present

## 2016-12-03 DIAGNOSIS — Z23 Encounter for immunization: Secondary | ICD-10-CM | POA: Diagnosis not present

## 2016-12-03 LAB — POCT URINALYSIS DIPSTICK
Bilirubin, UA: NEGATIVE
Glucose, UA: NEGATIVE
Nitrite, UA: NEGATIVE
Spec Grav, UA: 1.02 (ref 1.010–1.025)
Urobilinogen, UA: NEGATIVE E.U./dL — AB
pH, UA: 6.5 (ref 5.0–8.0)

## 2016-12-03 MED ORDER — NITROFURANTOIN MONOHYD MACRO 100 MG PO CAPS: 100.0000 mg | ORAL_CAPSULE | Freq: Two times a day (BID) | ORAL | 0 refills | Status: AC

## 2016-12-03 NOTE — Patient Instructions (Signed)
This is mostly likely a bladder infection   Take antibiotic    Will let  You know when culture results   are back .

## 2016-12-03 NOTE — Progress Notes (Signed)
Chief Complaint  Patient presents with  . Dysuria    x 2-3 days. Urinary frequency, low back pain and bloating. Unable to complete voiding.     HPI: Alexis [name] 26 y.o.   sda with Gu sx   Remote hx of pyelonephritis  And here to check fo ri nfection Onset days of urinary frequency and urgency. Without fever but fever rash no hematuria feels like mucousy discharge. No flank pain but some back pain. Is on continuous OCP and no unusual bleeding. No vomiting or diarrhea. Does not think she has increased risk of STI. ROS: See pertinent positives and negatives per HPI.  Past Medical History:  Diagnosis Date  . Abdominal pain, generalized 08/08/2008  . ACNE VULGARIS 04/28/2007  . Alcohol abuse, in remission     hx of amnesia   . ALCOHOL USE 07/22/2009  . Anxiety   . ATTENTION DEFICIT DISORDER 04/28/2007    hx of speech therapy  . Bipolar disorder (HCC)   . Chronic kidney disease   . Depression   . Dysuria 08/08/2008  . EXERCISE INDUCED ASTHMA 04/28/2007  . History of pyelonephritis    acute right  4 2011  . HSV (herpes simplex virus) anogenital infection    hx of same  . MUCOSITIS ULCERATIVE OF CERVIX VAGINA AND VULVA 08/08/2008   herpes  . POSTURAL LIGHTHEADEDNESS 04/28/2007  . PRIMARY DYSMENORRHEA 04/28/2007  . Rash and other nonspecific skin eruption 07/22/2009    Family History  Problem Relation Age of Onset  . Heart attack Mother   . Asthma Unknown   . Diabetes type II Unknown   . Alcohol abuse Paternal Grandmother   . Depression Paternal Grandmother     Social History   Social History  . Marital status: Single    Spouse name: N/A  . Number of children: N/A  . Years of education: N/A   Social History Main Topics  . Smoking status: Former Smoker    Packs/day: 0.30    Types: Cigarettes  . Smokeless tobacco: Never Used     Comment: per pt 4 cigarettes a day   . Alcohol use Yes  . Drug use: No     Comment: In the past- Pot 3 months ago  . Sexual activity:  Yes    Birth control/ protection: Pill   Other Topics Concern  . None   Social History Narrative   [employer] history  And english  Dropped out temporarily getting psych help  On line courses  In past    No ets    [employer] 4    Volunteered  [employer]   Limited etoh   Stopped tobacco( now 2-3 x per week)   Working [employer] 15- 20 hours per week.   [employer] to finish school [employer]    Outpatient Medications Prior to Visit  Medication Sig Dispense Refill  . albuterol (PROAIR HFA) 108 (90 BASE) MCG/ACT inhaler Inhale 2 puffs into the lungs every 6 (six) hours as needed for wheezing or shortness of breath. 1 Inhaler 2  . ethynodiol-ethinyl estradiol (KELNOR 1/35) 1-35 MG-MCG tablet TAKE 1 TABLET DAILY, SKIP LAST WEEK AND CONTINUE 112 tablet 0  . HYPERCARE 20 % external solution Apply externally to skin daily at bedtime 35 mL 0  . lamoTRIgine (LAMICTAL) 100 MG tablet Take 100 mg by mouth daily.     Marland Kitchen lisdexamfetamine (VYVANSE) 50 MG capsule Take 100 mg by mouth every morning.    . Multiple Vitamin (MULTI-VITAMIN PO)  Take by mouth.    . valACYclovir (VALTREX) 500 MG tablet Take 500 mg by mouth daily as needed (outbreak.).    Marland Kitchen. buPROPion (WELLBUTRIN XL) 300 MG 24 hr tablet      No facility-administered medications prior to visit.      EXAM:  BP 108/78 (BP Location: Right Arm, Patient Position: Sitting, Cuff Size: Normal)   Pulse 86   Temp 98.3 F (36.8 C) (Oral)   Wt 109 lb 9.6 oz (49.7 kg)   BMI 18.38 kg/m   Body mass index is 18.38 kg/m.  GENERAL: vitals reviewed and listed above, alert, oriented, appears well hydrated and in no acute distress HEENT: atraumatic, conjunctiva  clear, no obvious abnormalities on inspection of external nose and ears Abdomen soft without again medically guarding rebound no flank pain some discomfort in the suprapubic area but no masses. MS: moves all extremities without noticeable focal  abnormality PSYCH: pleasant and cooperative, no  obvious depression or anxiety UA and record positive ketones blood leukocytes and +1 protein. ASSESSMENT AND PLAN:  Discussed the following assessment and plan:  Acute cystitis with hematuria  Dysuria - Plan: POC Urinalysis Dipstick, Urine Culture  Need for influenza vaccination - Plan: Flu Vaccine QUAD 6+ mos PF IM (Fluarix Quad PF) Acute UTI most likely uncomplicated but with past history of significant complicated UTI Urine culture pending today Macrobid expectant management and follow-up as appropriate. -Patient advised to return or notify health care team  if symptoms worsen ,persist or new concerns arise.  Patient Instructions  This is mostly likely a bladder infection   Take antibiotic    Will let  You know when culture results   are back .         Neta MendsWanda K. Karo Rog M.D.

## 2016-12-06 LAB — URINE CULTURE
MICRO NUMBER:: 81171517
SPECIMEN QUALITY:: ADEQUATE

## 2017-01-27 DIAGNOSIS — F3132 Bipolar disorder, current episode depressed, moderate: Secondary | ICD-10-CM | POA: Diagnosis not present

## 2017-02-16 DIAGNOSIS — F331 Major depressive disorder, recurrent, moderate: Secondary | ICD-10-CM | POA: Diagnosis not present

## 2017-03-22 DIAGNOSIS — F331 Major depressive disorder, recurrent, moderate: Secondary | ICD-10-CM | POA: Diagnosis not present

## 2017-04-05 DIAGNOSIS — F331 Major depressive disorder, recurrent, moderate: Secondary | ICD-10-CM | POA: Diagnosis not present

## 2017-04-15 ENCOUNTER — Other Ambulatory Visit: Payer: Self-pay | Admitting: *Deleted

## 2017-04-15 MED ORDER — ETHYNODIOL DIAC-ETH ESTRADIOL 1-35 MG-MCG PO TABS: ORAL_TABLET | ORAL | 2 refills | Status: DC

## 2017-04-15 NOTE — Progress Notes (Signed)
Pt calling upset that she had called in on 2/22 and requested a refill of (Kelnor 1/35) and the requested medication  had not been refilled. Apologized to the delay in getting the medication refilled upon the first request. Notified  pt that the medication was going to be processed at this time. When pt called on the 2/22 a CRM was placed in the pt's chart and was not converted to a telephone encounter which resulted in nurse triage not getting the pt's requested refill.

## 2017-05-19 DIAGNOSIS — F331 Major depressive disorder, recurrent, moderate: Secondary | ICD-10-CM | POA: Diagnosis not present

## 2017-05-19 DIAGNOSIS — F411 Generalized anxiety disorder: Secondary | ICD-10-CM | POA: Diagnosis not present

## 2017-05-19 DIAGNOSIS — F900 Attention-deficit hyperactivity disorder, predominantly inattentive type: Secondary | ICD-10-CM | POA: Diagnosis not present

## 2017-08-08 ENCOUNTER — Ambulatory Visit: Payer: BLUE CROSS/BLUE SHIELD | Admitting: Internal Medicine

## 2017-08-08 ENCOUNTER — Encounter: Payer: Self-pay | Admitting: Internal Medicine

## 2017-08-08 VITALS — BP 118/88 | HR 88 | Temp 99.1°F | Wt 120.0 lb

## 2017-08-08 DIAGNOSIS — M25512 Pain in left shoulder: Secondary | ICD-10-CM

## 2017-08-08 MED ORDER — MELOXICAM 7.5 MG PO TABS: 7.5000 mg | ORAL_TABLET | Freq: Every day | ORAL | 0 refills | Status: DC

## 2017-08-08 NOTE — Progress Notes (Signed)
Chief Complaint  Patient presents with  . Shoulder Pain    left    HPI: Alexis [name] 27 y.o.  SDA   Right handed works in [employer] and no specific injury but shoulder bothering off and on and now bad for a week  dowse with over head lifting  Dose to chiro on moms advise doesn't t hink  related  But shoulder pops  And has pain tightness upper  Back  Are on left  ROS: See pertinent positives and negatives per HPI.  Past Medical History:  Diagnosis Date  . Abdominal pain, generalized 08/08/2008  . ACNE VULGARIS 04/28/2007  . Alcohol abuse, in remission     hx of amnesia   . ALCOHOL USE 07/22/2009  . Anxiety   . ATTENTION DEFICIT DISORDER 04/28/2007    hx of speech therapy  . Bipolar disorder (HCC)   . Chronic kidney disease   . Depression   . Dysuria 08/08/2008  . EXERCISE INDUCED ASTHMA 04/28/2007  . History of pyelonephritis    acute right  4 2011  . HSV (herpes simplex virus) anogenital infection    hx of same  . MUCOSITIS ULCERATIVE OF CERVIX VAGINA AND VULVA 08/08/2008   herpes  . POSTURAL LIGHTHEADEDNESS 04/28/2007  . PRIMARY DYSMENORRHEA 04/28/2007  . Rash and other nonspecific skin eruption 07/22/2009    Family History  Problem Relation Age of Onset  . Heart attack Mother   . Asthma Unknown   . Diabetes type II Unknown   . Alcohol abuse Paternal Grandmother   . Depression Paternal Grandmother     Social History   Socioeconomic History  . Marital status: Single    Spouse name: Not on file  . Number of children: Not on file  . Years of education: Not on file  . Highest education level: Not on file  Occupational History  . Not on file  Social Needs  . Financial resource strain: Not on file  . Food insecurity:    Worry: Not on file    Inability: Not on file  . Transportation needs:    Medical: Not on file    Non-medical: Not on file  Tobacco Use  . Smoking status: Former Smoker    Packs/day: 0.30    Types: Cigarettes  . Smokeless  tobacco: Never Used  . Tobacco comment: per pt 4 cigarettes a day   Substance and Sexual Activity  . Alcohol use: Yes  . Drug use: No    Comment: In the past- Pot 3 months ago  . Sexual activity: Yes    Birth control/protection: Pill  Lifestyle  . Physical activity:    Days per week: Not on file    Minutes per session: Not on file  . Stress: Not on file  Relationships  . Social connections:    Talks on phone: Not on file    Gets together: Not on file    Attends religious service: Not on file    Active member of club or organization: Not on file    Attends meetings of clubs or organizations: Not on file    Relationship status: Not on file  Other Topics Concern  . Not on file  Social History Narrative   [facility]  And english  Dropped out temporarily getting psych help  On line courses  In past    No ets    [employer] 4    Volunteered  [employer]   Limited etoh  Stopped tobacco( now 2-3 x per week)   Working [employer] 15- 20 hours per week.   [employer] to finish school [employer]    Outpatient Medications Prior to Visit  Medication Sig Dispense Refill  . buPROPion (WELLBUTRIN XL) 150 MG 24 hr tablet Take 150 mg by mouth daily.    Marland Kitchen ethynodiol-ethinyl estradiol (KELNOR 1/35) 1-35 MG-MCG tablet TAKE 1 TABLET DAILY, SKIP LAST WEEK AND CONTINUE 112 tablet 0  . HYPERCARE 20 % external solution Apply externally to skin daily at bedtime 35 mL 0  . lamoTRIgine (LAMICTAL) 100 MG tablet Take 100 mg by mouth daily.     Marland Kitchen lisdexamfetamine (VYVANSE) 50 MG capsule Take 70 mg by mouth every morning.     . Multiple Vitamin (MULTI-VITAMIN PO) Take by mouth.    Marland Kitchen albuterol (PROAIR HFA) 108 (90 BASE) MCG/ACT inhaler Inhale 2 puffs into the lungs every 6 (six) hours as needed for wheezing or shortness of breath. (Patient not taking: Reported on 08/08/2017) 1 Inhaler 2  . valACYclovir (VALTREX) 500 MG tablet Take 500 mg by mouth daily as needed (outbreak.).    Marland Kitchen ethynodiol-ethinyl  estradiol [place] 1/35) 1-35 MG-MCG tablet TAKE 1 TABLET DAILY, SKIP LAST WEEK AND CONTINUE (Patient not taking: Reported on 08/08/2017) 112 tablet 2   No facility-administered medications prior to visit.      EXAM:  BP 118/88   Pulse 88   Temp 99.1 F (37.3 C)   Wt 120 lb (54.4 kg)   BMI 20.12 kg/m   Body mass index is 20.12 kg/m.  GENERAL: vitals reviewed and listed above, alert, oriented, appears well hydrated and in no acute distress HEENT: atraumatic, conjunctiva  clear, no obvious abnormalities on inspection of external nose and earshold left shoulder lose but moves LUE NECK: no obvious masses on inspection palpation  MS: moves all extremities tender lateral shoulder and traps   Rotation ok but pain with elevation past 180 and hesitates to leift  No ov  Weakness  Motor grossly ok  PSYCH: pleasant and cooperative, no obvious depression or anxiety  ASSESSMENT AND PLAN:  Discussed the following assessment and plan:  Left shoulder pain, unspecified chronicity - Plan: Ambulatory referral to Sports Medicine  -Patient advised to return or notify health care team  if symptoms worsen ,persist or new concerns arise.  Patient Instructions  This may be an overuse   cause of mechanical pain .  Involving shoulder and trapezius  Area .   Ice after activity ie ned of day.   Antiinflammatory for 01-14 days  will be contacted about a sports  medicine referral to help define and maker treatment plan  And exercises etc      Shoulder Pain Many things can cause shoulder pain, including:  An injury to the area.  Overuse of the shoulder.  Arthritis.  The source of the pain can be:  Inflammation.  An injury to the shoulder joint.  An injury to a tendon, ligament, or bone.  Follow these instructions at home: Take these actions to help with your pain:  Squeeze a soft ball or a foam pad as much as possible. This helps to keep the shoulder from swelling. It also helps to  strengthen the arm.  Take over-the-counter and prescription medicines only as told by your health care provider.  If directed, apply ice to the area: ? Put ice in a plastic bag. ? Place a towel between your skin and the bag. ? Leave the ice on  for 20 minutes, 2-3 times per day. Stop applying ice if it does not help with the pain.  If you were given a shoulder sling or immobilizer: ? Wear it as told. ? Remove it to shower or bathe. ? Move your arm as little as possible, but keep your hand moving to prevent swelling.  Contact a health care provider if:  Your pain gets worse.  Your pain is not relieved with medicines.  New pain develops in your arm, hand, or fingers. Get help right away if:  Your arm, hand, or fingers: ? Tingle. ? Become numb. ? Become swollen. ? Become painful. ? Turn white or blue. This information is not intended to replace advice given to you by your health care provider. Make sure you discuss any questions you have with your health care provider. Document Released: 11/11/2004 Document Revised: 09/28/2015 Document Reviewed: 05/27/2014 Elsevier Interactive Patient Education  2018 [employer].      Etna Green. Magaline Steinberg M.D.

## 2017-08-08 NOTE — Patient Instructions (Addendum)
This may be an overuse   cause of mechanical pain .  Involving shoulder and trapezius  Area .   Ice after activity ie ned of day.   Antiinflammatory for 01-14 days  will be contacted about a sports  medicine referral to help define and maker treatment plan  And exercises etc      Shoulder Pain Many things can cause shoulder pain, including:  An injury to the area.  Overuse of the shoulder.  Arthritis.  The source of the pain can be:  Inflammation.  An injury to the shoulder joint.  An injury to a tendon, ligament, or bone.  Follow these instructions at home: Take these actions to help with your pain:  Squeeze a soft ball or a foam pad as much as possible. This helps to keep the shoulder from swelling. It also helps to strengthen the arm.  Take over-the-counter and prescription medicines only as told by your health care provider.  If directed, apply ice to the area: ? Put ice in a plastic bag. ? Place a towel between your skin and the bag. ? Leave the ice on for 20 minutes, 2-3 times per day. Stop applying ice if it does not help with the pain.  If you were given a shoulder sling or immobilizer: ? Wear it as told. ? Remove it to shower or bathe. ? Move your arm as little as possible, but keep your hand moving to prevent swelling.  Contact a health care provider if:  Your pain gets worse.  Your pain is not relieved with medicines.  New pain develops in your arm, hand, or fingers. Get help right away if:  Your arm, hand, or fingers: ? Tingle. ? Become numb. ? Become swollen. ? Become painful. ? Turn white or blue. This information is not intended to replace advice given to you by your health care provider. Make sure you discuss any questions you have with your health care provider. Document Released: 11/11/2004 Document Revised: 09/28/2015 Document Reviewed: 05/27/2014 Elsevier Interactive Patient Education  [employer]2018 Elsevier Inc.

## 2017-08-16 ENCOUNTER — Ambulatory Visit: Payer: Self-pay | Admitting: Family Medicine

## 2017-08-16 DIAGNOSIS — F3132 Bipolar disorder, current episode depressed, moderate: Secondary | ICD-10-CM | POA: Diagnosis not present

## 2017-08-16 DIAGNOSIS — F900 Attention-deficit hyperactivity disorder, predominantly inattentive type: Secondary | ICD-10-CM | POA: Diagnosis not present

## 2017-08-25 ENCOUNTER — Encounter: Payer: Self-pay | Admitting: Internal Medicine

## 2017-09-01 ENCOUNTER — Other Ambulatory Visit: Payer: Self-pay | Admitting: Internal Medicine

## 2017-11-29 DIAGNOSIS — F3132 Bipolar disorder, current episode depressed, moderate: Secondary | ICD-10-CM | POA: Diagnosis not present

## 2017-11-29 DIAGNOSIS — F900 Attention-deficit hyperactivity disorder, predominantly inattentive type: Secondary | ICD-10-CM | POA: Diagnosis not present

## 2017-12-13 ENCOUNTER — Other Ambulatory Visit: Payer: Self-pay | Admitting: Internal Medicine

## 2018-03-21 DIAGNOSIS — F3132 Bipolar disorder, current episode depressed, moderate: Secondary | ICD-10-CM | POA: Diagnosis not present

## 2018-03-21 DIAGNOSIS — F900 Attention-deficit hyperactivity disorder, predominantly inattentive type: Secondary | ICD-10-CM | POA: Diagnosis not present

## 2018-07-11 DIAGNOSIS — F411 Generalized anxiety disorder: Secondary | ICD-10-CM | POA: Diagnosis not present

## 2018-07-11 DIAGNOSIS — F332 Major depressive disorder, recurrent severe without psychotic features: Secondary | ICD-10-CM | POA: Diagnosis not present

## 2018-08-28 ENCOUNTER — Other Ambulatory Visit: Payer: Self-pay | Admitting: Internal Medicine

## 2018-09-22 ENCOUNTER — Encounter: Payer: Self-pay | Admitting: Internal Medicine

## 2018-09-22 ENCOUNTER — Ambulatory Visit (INDEPENDENT_AMBULATORY_CARE_PROVIDER_SITE_OTHER): Payer: BC Managed Care – PPO | Admitting: Internal Medicine

## 2018-09-22 ENCOUNTER — Other Ambulatory Visit: Payer: Self-pay

## 2018-09-22 ENCOUNTER — Ambulatory Visit: Payer: BLUE CROSS/BLUE SHIELD | Admitting: Internal Medicine

## 2018-09-22 DIAGNOSIS — Z3009 Encounter for other general counseling and advice on contraception: Secondary | ICD-10-CM

## 2018-09-22 NOTE — Progress Notes (Signed)
Virtual Visit via Video Note  I connected with@ on 09/22/18 at  2:15 PM EDT by a video enabled telemedicine application and verified that I am speaking with the correct person using two identifiers. Location patient: home Location provider:work  office Persons participating in the virtual visit: patient, provider  [place]th national recommendations  regarding COVID 19 pandemic   video visit is advised over in office visit for this patient.  Patient aware  of the limitations of evaluation and management by telemedicine and  Less availability of in person appointments. and agreed to proceed.   HPI: Alexis [name] presents for video visit to disc options for  Contraceptions and long acting  Options    iud vx implant etc  She is doing well  Graduated and to begin ft job  But at home since covid and doing well at this time.  Periods are light and takes continuously   No other concerns  Mood staill on  vyvanse Lamictal and wellbutrin  No new medical problems   ROS: See pertinent positives and negatives per HPI.  Past Medical History:  Diagnosis Date  . Abdominal pain, generalized 08/08/2008  . ACNE VULGARIS 04/28/2007  . Alcohol abuse, in remission     hx of amnesia   . ALCOHOL USE 07/22/2009  . Anxiety   . ATTENTION DEFICIT DISORDER 04/28/2007    hx of speech therapy  . Bipolar disorder ([place])   . Chronic kidney disease   . Depression   . Dysuria 08/08/2008  . EXERCISE INDUCED ASTHMA 04/28/2007  . History of pyelonephritis    acute right  4 2011  . HSV (herpes simplex virus) anogenital infection    hx of same  . MUCOSITIS ULCERATIVE OF CERVIX VAGINA AND VULVA 08/08/2008   herpes  . POSTURAL LIGHTHEADEDNESS 04/28/2007  . PRIMARY DYSMENORRHEA 04/28/2007  . Rash and other nonspecific skin eruption 07/22/2009    History reviewed. No pertinent surgical history.  Family History  Problem Relation Age of Onset  . Heart attack Mother   . Asthma Unknown   . Diabetes type II Unknown   .  Alcohol abuse Paternal Grandmother   . Depression Paternal Grandmother     Social History   Tobacco Use  . Smoking status: Former Smoker    Packs/day: 0.30    Types: Cigarettes  . Smokeless tobacco: Never Used  . Tobacco comment: per pt 4 cigarettes a day   Substance Use Topics  . Alcohol use: Yes  . Drug use: No    Comment: In the past- Pot 3 months ago      Current Outpatient Medications:  .  albuterol (PROAIR HFA) 108 (90 BASE) MCG/ACT inhaler, Inhale 2 puffs into the lungs every 6 (six) hours as needed for wheezing or shortness of breath. (Patient not taking: Reported on 08/08/2017), Disp: 1 Inhaler, Rfl: 2 .  buPROPion (WELLBUTRIN XL) 150 MG 24 hr tablet, Take 150 mg by mouth daily., Disp: , Rfl:  .  ethynodiol-ethinyl estradiol (KELNOR 1/35) 1-35 MG-MCG tablet, TAKE 1 TABLET DAILY, SKIP LAST WEEK AND CONTINUE, Disp: 112 tablet, Rfl: 0 .  ethynodiol-ethinyl estradiol (KELNOR 1/35) 1-35 MG-MCG tablet, TAKE 1 TABLET DAILY, SKIP LAST WEEK AND CONTINUE, Disp: 112 tablet, Rfl: 2 .  HYPERCARE 20 % external solution, Apply externally to skin daily at bedtime, Disp: 35 mL, Rfl: 0 .  lamoTRIgine (LAMICTAL) 100 MG tablet, Take 100 mg by mouth daily. , Disp: , Rfl:  .  lisdexamfetamine (VYVANSE) 50 MG capsule, Take  70 mg by mouth every morning. , Disp: , Rfl:  .  meloxicam (MOBIC) 7.5 MG tablet, TAKE 1 TABLET (7.5 MG TOTAL) BY MOUTH DAILY. MAY TAKE EXTRA DOSE PER DAY IF NEEDED FOR PAIN, Disp: 30 tablet, Rfl: 0 .  Multiple Vitamin (MULTI-VITAMIN PO), Take by mouth., Disp: , Rfl:  .  valACYclovir (VALTREX) 500 MG tablet, Take 500 mg by mouth daily as needed (outbreak.)., Disp: , Rfl:   EXAM: BP Readings from Last 3 Encounters:  08/08/17 118/88  12/03/16 108/78  09/09/16 110/82    VITALS per patient if applicable: GENERAL: alert, oriented, appears well and in no acute distress HEENT: atraumatic, conjunttiva clear, no obvious abnormalities on inspection of external nose and ears NECK:  normal movements of the head and neck LUNGS: on inspection no signs of respiratory distress, breathing rate appears normal, no obvious gross SOB, gasping or wheezing CV: no obvious cyanosis PSYCH/NEURO: pleasant and cooperative, no obvious depression or anxiety, speech and thought processing grossly intact Lab Results  Component Value Date   WBC 5.7 07/10/2014   HGB 13.9 07/10/2014   HCT 40.1 07/10/2014   PLT 238.0 07/10/2014   GLUCOSE 90 07/10/2014   CHOL 171 07/10/2014   TRIG 90.0 07/10/2014   HDL 53.40 07/10/2014   LDLDIRECT 116.0 04/27/2011   LDLCALC 100 (H) 07/10/2014   ALT 16 07/10/2014   AST 12 07/10/2014   NA 137 07/10/2014   K 4.4 07/10/2014   CL 107 07/10/2014   CREATININE 0.83 07/10/2014   BUN 15 07/10/2014   CO2 27 07/10/2014   TSH 0.49 07/10/2014    ASSESSMENT AND PLAN:  Discussed the following assessment and plan:    ICD-10-CM   1. Encounter for other general counseling or advice on contraception  Z30.09     Counseled. / Disc    IUD  Vs nexplanon  She feels iud may be best for her and I agree   For many reasons.   She will make appt with gyne and let us know if we need to  Help or rx in interim  congrats on FT job and graduating   And any help needed to let us know     If getting pap s etc from gyne then  Can see us as needed or  ocass cpx      Expectant management and discussion of plan and treatment with opportunity to ask questions and all were answered. The patient agreed with the plan and demonstrated an understanding of the instructions.   Advised to call back or seek an in-person evaluation if worsening  or having  further concerns .   Berniece AndreasWanda Ryelynn Guedea, MD

## 2018-10-05 ENCOUNTER — Other Ambulatory Visit: Payer: Self-pay

## 2018-10-09 ENCOUNTER — Ambulatory Visit: Payer: BC Managed Care – PPO | Admitting: Obstetrics & Gynecology

## 2018-10-09 ENCOUNTER — Encounter: Payer: Self-pay | Admitting: Obstetrics & Gynecology

## 2018-10-09 ENCOUNTER — Other Ambulatory Visit (HOSPITAL_COMMUNITY)
Admission: RE | Admit: 2018-10-09 | Discharge: 2018-10-09 | Disposition: A | Discharge status: Home / Self Care | Payer: BC Managed Care – PPO | Source: Ambulatory Visit | Attending: Obstetrics & Gynecology | Admitting: Obstetrics & Gynecology

## 2018-10-09 VITALS — BP 144/86 | HR 100 | Temp 98.1°F | Ht 64.5 in | Wt 132.0 lb

## 2018-10-09 DIAGNOSIS — Z01419 Encounter for gynecological examination (general) (routine) without abnormal findings: Secondary | ICD-10-CM | POA: Diagnosis not present

## 2018-10-09 DIAGNOSIS — Z01812 Encounter for preprocedural laboratory examination: Secondary | ICD-10-CM

## 2018-10-09 DIAGNOSIS — Z124 Encounter for screening for malignant neoplasm of cervix: Secondary | ICD-10-CM | POA: Diagnosis not present

## 2018-10-09 DIAGNOSIS — Z113 Encounter for screening for infections with a predominantly sexual mode of transmission: Secondary | ICD-10-CM | POA: Diagnosis not present

## 2018-10-09 DIAGNOSIS — R030 Elevated blood-pressure reading, without diagnosis of hypertension: Secondary | ICD-10-CM

## 2018-10-09 DIAGNOSIS — Z30430 Encounter for insertion of intrauterine contraceptive device: Secondary | ICD-10-CM | POA: Diagnosis not present

## 2018-10-09 DIAGNOSIS — N390 Urinary tract infection, site not specified: Secondary | ICD-10-CM

## 2018-10-09 DIAGNOSIS — R8290 Unspecified abnormal findings in urine: Secondary | ICD-10-CM

## 2018-10-09 DIAGNOSIS — Z30014 Encounter for initial prescription of intrauterine contraceptive device: Secondary | ICD-10-CM

## 2018-10-09 LAB — POCT URINALYSIS DIPSTICK
Bilirubin, UA: NEGATIVE
Blood, UA: NEGATIVE
Glucose, UA: NEGATIVE
Ketones, UA: NEGATIVE
Nitrite, UA: POSITIVE
Protein, UA: NEGATIVE
Urobilinogen, UA: 0.2 E.U./dL
pH, UA: 5 (ref 5.0–8.0)

## 2018-10-09 LAB — POCT URINE PREGNANCY: Preg Test, Ur: NEGATIVE

## 2018-10-09 MED ORDER — SULFAMETHOXAZOLE-TRIMETHOPRIM 800-160 MG PO TABS: 1.0000 | ORAL_TABLET | Freq: Two times a day (BID) | ORAL | 0 refills | Status: DC

## 2018-10-09 NOTE — Progress Notes (Signed)
28 y.o. G0P0000 Single White or Caucasian female here for annual exam.  She is a new patient today.  She wants to discuss a change in contraception.  She has been using a combination pill for >10 years.  She was started on this partly due to acne.  She reports acne is much improved due to face washing habits.  Would like an IUD.  Misses pills from time to time.  Wants reliable contraception.  Has checked coverage with insurance so would like placed today.  She is SA but has not had intercourse since March.    PCP:  Dr. Regis Bill.  Patient's last menstrual period was 09/24/2018 (approximate).          Sexually active: No.  The current method of family planning is OCP (estrogen/progesterone) and abstinence.    Exercising: No Smoker:  yes  Health Maintenance: Pap:  06/15/13 Neg  History of abnormal Pap:  no MMG:  08/06/14 Breast US right BIRADS2:benign  TDaP:  2015 Gardasil: completed  Screening Labs: PCP   reports that she has been smoking cigarettes. She has been smoking about 0.25 packs per day. She has never used smokeless tobacco. She reports current alcohol use of about 14.0 - 20.0 standard drinks of alcohol per week. She reports that she does not use drugs.  Past Medical History:  Diagnosis Date  . ACNE VULGARIS 04/28/2007  . Alcohol abuse, in remission     hx of amnesia   . ALCOHOL USE 07/22/2009  . Anxiety   . ATTENTION DEFICIT DISORDER 04/28/2007    hx of speech therapy  . Bipolar disorder ([place])   . Depression   . EXERCISE INDUCED ASTHMA 04/28/2007  . History of pyelonephritis 05/2009  . HSV (herpes simplex virus) anogenital infection     History reviewed. No pertinent surgical history.  Current Outpatient Medications  Medication Sig Dispense Refill  . buPROPion (WELLBUTRIN XL) 150 MG 24 hr tablet Take 150 mg by mouth daily.    Marland Kitchen ethynodiol-ethinyl estradiol (KELNOR 1/35) 1-35 MG-MCG tablet TAKE 1 TABLET DAILY, SKIP LAST WEEK AND CONTINUE 112 tablet 0  . HYPERCARE 20 %  external solution Apply externally to skin daily at bedtime 35 mL 0  . lamoTRIgine (LAMICTAL) 150 MG tablet Take 150 mg by mouth at bedtime.    . Multiple Vitamin (MULTI-VITAMIN PO) Take by mouth.    Marland Kitchen VYVANSE 70 MG capsule Take 1 capsule by mouth daily.     No current facility-administered medications for this visit.     Family History  Problem Relation Age of Onset  . Heart attack Mother   . Asthma Mother   . Alcohol abuse Paternal Grandmother   . Depression Paternal Grandmother   . Diabetes type II Maternal Grandmother   . Dementia Maternal Grandfather   . Stroke Paternal Grandfather     Review of Systems  Endocrine: Positive for polydipsia.  All other systems reviewed and are negative.   Exam:   BP (!) 144/86   Pulse 100   Temp 98.1 F (36.7 C) (Temporal)   Ht 5' 4.5" (1.638 m)   Wt 132 lb (59.9 kg)   LMP 09/24/2018 (Approximate)   BMI 22.31 kg/m    Height: 5' 4.5" (163.8 cm)  Ht Readings from Last 3 Encounters:  10/09/18 5' 4.5" (1.638 m)  07/17/14 5' 4.75" (1.645 m)  06/15/13 5' 4.5" (1.638 m)    General appearance: alert, cooperative and appears stated age Head: Normocephalic, without obvious abnormality, atraumatic Neck: no  adenopathy, supple, symmetrical, trachea midline and thyroid normal to inspection and palpation Lungs: clear to auscultation bilaterally Breasts: right 1.5cm smooth nodule about 9 o'clock, no other masses, skin changes, LAD with either breast Heart: regular rate and rhythm Abdomen: soft, non-tender; bowel sounds normal; no masses,  no organomegaly Extremities: extremities normal, atraumatic, no cyanosis or edema Skin: Skin color, texture, turgor normal. No rashes or lesions Lymph nodes: Cervical, supraclavicular, and axillary nodes normal. No abnormal inguinal nodes palpated Neurologic: Grossly normal   Pelvic: External genitalia:  no lesions              Urethra:  normal appearing urethra with no masses, tenderness or lesions               Bartholins and Skenes: normal                 Vagina: normal appearing vagina with normal color and discharge, no lesions              Cervix: no lesions              Pap taken: Yes.   Bimanual Exam:  Uterus:  normal size, contour, position, consistency, mobility, non-tender              Adnexa: normal adnexa and no mass, fullness, tenderness               Rectovaginal: Confirms               Anus:  normal sphincter tone, no lesions  Procedure:  Speculum reinserted.  Cervix visualized and cleansed with Betadine x 3.  Paracervical block was not placed.  Single toothed tenaculum applied to anterior lip of cervix without difficulty.  Uterus sounded to 7cm.  Lot number: [id number].  Expiration:  2/22.  IUD package was opened.  IUD and introducer passed to fundus and then withdrawn slightly before IUD was passed into endometrial cavity.  Introducer removed.  Strings cut to 2cm.  Tenaculum removed from cervix.  Minimal bleeding noted.  Pt tolerated the procedure well.  All instruments removed from vagina.  Chaperone was present for exam.  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  Recheck 6-8 weeks HIV, RPR, Hep C obtained today (h/o Hep B vaccination)

## 2018-10-10 LAB — URINALYSIS, MICROSCOPIC ONLY: Casts: NONE SEEN /lpf

## 2018-10-10 LAB — HIV ANTIBODY (ROUTINE TESTING W REFLEX): HIV Screen 4th Generation wRfx: NONREACTIVE

## 2018-10-10 LAB — CYTOLOGY - PAP: Diagnosis: NEGATIVE

## 2018-10-10 LAB — RPR: RPR Ser Ql: NONREACTIVE

## 2018-10-10 LAB — HEPATITIS C ANTIBODY: Hep C Virus Ab: <0.1 s/co ratio (ref 0.0–0.9)

## 2018-10-11 LAB — URINE CULTURE

## 2018-10-11 LAB — CHLAMYDIA/GONOCOCCUS/TRICHOMONAS, NAA
Chlamydia by NAA: NEGATIVE
Gonococcus by NAA: NEGATIVE
Trich vag by NAA: NEGATIVE

## 2018-10-12 ENCOUNTER — Telehealth: Payer: Self-pay | Admitting: *Deleted

## 2018-10-12 NOTE — Telephone Encounter (Signed)
-----   Message from Megan Salon, MD sent at 10/12/2018 10:25 AM EDT ----- Please let pt know her GC/Chl, trich testing was negative.  Urine culture showed e coli.  Was treated with bactrim ds bid x 3 days.  Treatment should have been adequate.  I would like to repeat her urine culture in two weeks due to having almost no symptoms.

## 2018-10-12 NOTE — Telephone Encounter (Signed)
LM for pt to call back.

## 2018-10-12 NOTE — Telephone Encounter (Signed)
Patient notified. Nurse appt scheduled.

## 2018-10-12 NOTE — Addendum Note (Signed)
Addended by: Megan Salon on: 10/12/2018 10:26 AM   Modules accepted: Orders

## 2018-10-24 ENCOUNTER — Ambulatory Visit: Payer: BC Managed Care – PPO | Admitting: Adult Health

## 2018-10-25 ENCOUNTER — Ambulatory Visit (INDEPENDENT_AMBULATORY_CARE_PROVIDER_SITE_OTHER): Payer: BC Managed Care – PPO

## 2018-10-25 ENCOUNTER — Encounter: Payer: Self-pay | Admitting: Adult Health

## 2018-10-25 ENCOUNTER — Other Ambulatory Visit: Payer: Self-pay

## 2018-10-25 ENCOUNTER — Ambulatory Visit (INDEPENDENT_AMBULATORY_CARE_PROVIDER_SITE_OTHER): Payer: BC Managed Care – PPO | Admitting: Adult Health

## 2018-10-25 VITALS — BP 122/64 | HR 84 | Temp 97.6°F | Resp 16 | Ht 64.5 in | Wt 129.8 lb

## 2018-10-25 VITALS — Ht 64.0 in | Wt 130.0 lb

## 2018-10-25 DIAGNOSIS — Z87440 Personal history of urinary (tract) infections: Secondary | ICD-10-CM | POA: Diagnosis not present

## 2018-10-25 DIAGNOSIS — F909 Attention-deficit hyperactivity disorder, unspecified type: Secondary | ICD-10-CM | POA: Diagnosis not present

## 2018-10-25 DIAGNOSIS — F331 Major depressive disorder, recurrent, moderate: Secondary | ICD-10-CM

## 2018-10-25 DIAGNOSIS — N390 Urinary tract infection, site not specified: Secondary | ICD-10-CM

## 2018-10-25 DIAGNOSIS — F411 Generalized anxiety disorder: Secondary | ICD-10-CM | POA: Diagnosis not present

## 2018-10-25 MED ORDER — BUPROPION HCL ER (XL) 150 MG PO TB24: 150.0000 mg | ORAL_TABLET | Freq: Every day | ORAL | 5 refills | Status: DC

## 2018-10-25 MED ORDER — LISDEXAMFETAMINE DIMESYLATE 70 MG PO CAPS: 70.0000 mg | ORAL_CAPSULE | Freq: Every day | ORAL | 0 refills | Status: DC

## 2018-10-25 MED ORDER — LAMOTRIGINE 150 MG PO TABS: 150.0000 mg | ORAL_TABLET | Freq: Every day | ORAL | 5 refills | Status: DC

## 2018-10-25 NOTE — Progress Notes (Signed)
Patient here for a urine culture after finishing round of bactrim. Patient states that she is not having any symptoms.   Sending for provider for final review.

## 2018-10-25 NOTE — Progress Notes (Signed)
Alexis [name] Torti [id number] [birth year] 28 y.o.  Virtual Visit via Telephone Note  I connected with pt on 10/25/18 at 12:00 PM EDT by telephone and verified that I am speaking with the correct person using two identifiers.   I discussed the limitations, risks, security and privacy concerns of performing an evaluation and management service by telephone and the availability of in person appointments. I also discussed with the patient that there may be [name] patient responsible charge related to this service. The patient expressed understanding and agreed to proceed.   I discussed the assessment and treatment plan with the patient. The patient was provided an opportunity to ask questions and all were answered. The patient agreed with the plan and demonstrated an understanding of the instructions.   The patient was advised to call back or seek an in-person evaluation if the symptoms worsen or if the condition fails to improve as anticipated.  I provided 30 minutes of non-face-to-face time during this encounter.  The patient was located at home.  [facility]   Dorothyann Gibbsegina N Fred Franzen, NP   Subjective:   Patient ID:  Alexis [name] Schendel is [name] 28 y.o. (DOB [birth year]) female.  Chief Complaint: No chief complaint on file.   HPI Alexis [name] Helman presents for follow-up of ADHD, GAD, MDD.   Describes mood today as "ok". Pleasant. Mood symptoms - denies depression, anxiety, and irritability. Stating "I'm doing really well". Has [name] full time job. 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 Plans to go to [place] next year - graduation present from her mother. Stable interest and motivation. Taking medications as prescribed.  Energy levels stable. Active, does not have [name] regular exercise routine, nut plans to start. Works full-time.  Enjoys some usual interests and activities. Lives with parents. Spending time with family. Talking with friends.   Appetite adequate. Weight gain - 10 pounds. Sleeps well most nights. Averages 6 to 8 hours. Focus and concentration stable with Vyvanse. Completing tasks. Managing aspects of household. Work going well.  Denies SI or HI. Denies AH or VH.  Review of Systems:  Review of Systems  Musculoskeletal: Negative for gait problem.  Neurological: Negative for tremors.  Psychiatric/Behavioral:       Please refer to HPI    Medications: I have reviewed the patient's current medications.  Current Outpatient Medications  Medication Sig Dispense Refill  . buPROPion (WELLBUTRIN XL) 150 MG 24 hr tablet Take 1 tablet (150 mg total) by mouth daily. 30 tablet 5  . ethynodiol-ethinyl estradiol (KELNOR 1/35) 1-35 MG-MCG tablet TAKE 1 TABLET DAILY, SKIP LAST WEEK AND CONTINUE 112 tablet 0  . HYPERCARE 20 % external solution Apply externally to skin daily at bedtime 35 mL 0  . lamoTRIgine (LAMICTAL) 150 MG tablet Take 1 tablet (150 mg total) by mouth at bedtime. 30 tablet 5  . lisdexamfetamine (VYVANSE) 70 MG capsule Take 1 capsule (70 mg total) by mouth daily. 30 capsule 0  . [START ON 11/22/2018] lisdexamfetamine (VYVANSE) 70 MG capsule Take 1 capsule (70 mg total) by mouth daily. 30 capsule 0  . [START ON 12/20/2018] lisdexamfetamine (VYVANSE) 70 MG capsule Take 1 capsule (70 mg total) by mouth daily. 30 capsule 0  . Multiple Vitamin (MULTI-VITAMIN PO) Take by mouth.    . sulfamethoxazole-trimethoprim (BACTRIM DS) 800-160 MG tablet Take 1 tablet by mouth 2 (two) times daily. 6 tablet 0   No current facility-administered medications for this visit.     Medication  Side Effects: None  Allergies:  Allergies  Allergen Reactions  . Hornet Venom Anaphylaxis    Past Medical History:  Diagnosis Date  . ACNE VULGARIS 04/28/2007  . Alcohol abuse, in remission     hx of amnesia   . ALCOHOL USE 07/22/2009  . Anxiety   . ATTENTION DEFICIT DISORDER 04/28/2007    hx of speech therapy  . Bipolar disorder ([facility])    . Depression   . EXERCISE INDUCED ASTHMA 04/28/2007  . History of pyelonephritis 05/2009  . HSV (herpes simplex virus) anogenital infection     Family History  Problem Relation Age of Onset  . Heart attack Mother   . Asthma Mother   . Alcohol abuse Paternal Grandmother   . Depression Paternal Grandmother   . Diabetes type II Maternal Grandmother   . Dementia Maternal Grandfather   . Stroke Paternal Grandfather     Social History   Socioeconomic History  . Marital status: Single    Spouse name: Not on file  . Number of children: Not on file  . Years of education: Not on file  . Highest education level: Not on file  Occupational History  . Not on file  Social Needs  . Financial resource strain: Not on file  . Food insecurity    Worry: Not on file    Inability: Not on file  . Transportation needs    Medical: Not on file    Non-medical: Not on file  Tobacco Use  . Smoking status: Current Every Day Smoker    Packs/day: 0.25    Types: Cigarettes  . Smokeless tobacco: Never Used  Substance and Sexual Activity  . Alcohol use: Yes    Alcohol/week: 14.0 - 20.0 standard drinks    Types: 14 - 20 Glasses of wine per week  . Drug use: No    Comment: In the past- Pot 3 months ago  . Sexual activity: Yes    Birth control/protection: Pill  Lifestyle  . Physical activity    Days per week: Not on file    Minutes per session: Not on file  . Stress: Not on file  Relationships  . Social Musician on phone: Not on file    Gets together: Not on file    Attends religious service: Not on file    Active member of club or organization: Not on file    Attends meetings of clubs or organizations: Not on file    Relationship status: Not on file  . Intimate partner violence    Fear of current or ex partner: Not on file    Emotionally abused: Not on file    Physically abused: Not on file    Forced sexual activity: Not on file  Other Topics Concern  . Not on file  Social  History Narrative   [facility] history  And english  Dropped out temporarily getting psych help  On line courses  In past    No ets    [employer]    Volunteered  [employer]   Limited etoh   Stopped tobacco( now 2-3 x per week)   Working [employer] 15- 20 hours per week.   [employer] to finish school degress    Past Medical History, Surgical history, Social history, and Family history were reviewed and updated as appropriate.   Please see review of systems for further details on the patient's review from today.   Objective:   Physical Exam:  Ht  5\' 4"  (1.626 m)   Wt 130 lb (59 kg)   BMI 22.31 kg/m   Physical Exam  Constitutional: She is oriented to person, place, and time.  Neurological: She is alert and oriented to person, place, and time.  Psychiatric: Mood, memory, affect and judgment normal.    Lab Review:     Component Value Date/Time   NA 137 07/10/2014 0859   K 4.4 07/10/2014 0859   CL 107 07/10/2014 0859   CO2 27 07/10/2014 0859   GLUCOSE 90 07/10/2014 0859   BUN 15 07/10/2014 0859   CREATININE 0.83 07/10/2014 0859   CALCIUM 8.9 07/10/2014 0859   PROT 6.5 07/10/2014 0859   ALBUMIN 4.0 07/10/2014 0859   AST 12 07/10/2014 0859   ALT 16 07/10/2014 0859   ALKPHOS 39 07/10/2014 0859   BILITOT 0.3 07/10/2014 0859   GFRNONAA >90 08/24/2012 1346   GFRAA >90 08/24/2012 1346       Component Value Date/Time   WBC 5.7 07/10/2014 0859   RBC 4.61 07/10/2014 0859   HGB 13.9 07/10/2014 0859   HCT 40.1 07/10/2014 0859   PLT 238.0 07/10/2014 0859   MCV 87.1 07/10/2014 0859   MCV 90.3 08/21/2012 2004   MCH 28.8 08/24/2012 1346   MCHC 34.7 07/10/2014 0859   RDW 12.9 07/10/2014 0859   LYMPHSABS 2.8 07/10/2014 0859   MONOABS 0.4 07/10/2014 0859   EOSABS 0.1 07/10/2014 0859   BASOSABS 0.0 07/10/2014 0859    No results found for: POCLITH, LITHIUM   No results found for: PHENYTOIN, PHENOBARB, VALPROATE, CBMZ   .res Assessment: Plan:    Plan:  Vyvanse 70mg   daily - 3 months supply Lamictal 150mg  daily Wellbutrin XL 150mg  daily  RTC 3 months  Patient advised to contact office with any questions, adverse effects, or acute worsening in signs and symptoms.  Discussed potential benefits, risks, and side effects of stimulants with patient to include increased heart rate, palpitations, insomnia, increased anxiety, increased irritability, or decreased appetite.  Instructed patient to contact office if experiencing any significant tolerability issues.  Counseled patient regarding potential benefits, risks, and side effects of Lamictal to include potential risk of Stevens-Johnson syndrome. Advised patient to stop taking Lamictal and contact office immediately if rash develops and to seek urgent medical attention if rash is severe and/or spreading quickly.   Diagnoses and all orders for this visit:  Generalized anxiety disorder -     buPROPion (WELLBUTRIN XL) 150 MG 24 hr tablet; Take 1 tablet (150 mg total) by mouth daily.  Major depressive disorder, recurrent episode, moderate (HCC) -     buPROPion (WELLBUTRIN XL) 150 MG 24 hr tablet; Take 1 tablet (150 mg total) by mouth daily. -     lamoTRIgine (LAMICTAL) 150 MG tablet; Take 1 tablet (150 mg total) by mouth at bedtime.  Attention deficit hyperactivity disorder (ADHD), unspecified ADHD type -     lisdexamfetamine (VYVANSE) 70 MG capsule; Take 1 capsule (70 mg total) by mouth daily. -     lisdexamfetamine (VYVANSE) 70 MG capsule; Take 1 capsule (70 mg total) by mouth daily. -     lisdexamfetamine (VYVANSE) 70 MG capsule; Take 1 capsule (70 mg total) by mouth daily.    Please see After Visit Summary for patient specific instructions.  Future Appointments  Date Time Provider [place]  11/28/2018  4:00 PM Megan Salon, MD [place] None    No orders of the defined types were placed in this encounter.     -------------------------------

## 2018-10-27 LAB — URINE CULTURE

## 2018-11-01 ENCOUNTER — Other Ambulatory Visit: Payer: Self-pay | Admitting: *Deleted

## 2018-11-01 MED ORDER — CEPHALEXIN 500 MG PO CAPS: 500.0000 mg | ORAL_CAPSULE | Freq: Four times a day (QID) | ORAL | 0 refills | Status: AC

## 2018-11-13 ENCOUNTER — Ambulatory Visit: Payer: BC Managed Care – PPO

## 2018-11-13 ENCOUNTER — Other Ambulatory Visit: Payer: Self-pay

## 2018-11-13 VITALS — BP 106/70 | HR 74 | Temp 97.3°F | Ht 64.5 in | Wt 133.6 lb

## 2018-11-13 DIAGNOSIS — N390 Urinary tract infection, site not specified: Secondary | ICD-10-CM

## 2018-11-13 NOTE — Progress Notes (Signed)
Pt is here for follow up from recurrent UTI. She completed the antibiotic and has no abnormal urinary symptoms. Urine culture and micro sent for TOC.

## 2018-11-14 LAB — URINALYSIS, MICROSCOPIC ONLY: Casts: NONE SEEN /lpf

## 2018-11-15 LAB — URINE CULTURE: Organism ID, Bacteria: NO GROWTH

## 2018-11-20 ENCOUNTER — Other Ambulatory Visit: Payer: Self-pay

## 2018-11-22 ENCOUNTER — Encounter: Payer: Self-pay | Admitting: Obstetrics & Gynecology

## 2018-11-22 ENCOUNTER — Ambulatory Visit: Payer: BC Managed Care – PPO | Admitting: Obstetrics & Gynecology

## 2018-11-28 ENCOUNTER — Ambulatory Visit: Payer: BC Managed Care – PPO | Admitting: Obstetrics & Gynecology

## 2018-12-01 ENCOUNTER — Other Ambulatory Visit: Payer: Self-pay

## 2018-12-05 ENCOUNTER — Ambulatory Visit (INDEPENDENT_AMBULATORY_CARE_PROVIDER_SITE_OTHER): Payer: BC Managed Care – PPO | Admitting: Obstetrics & Gynecology

## 2018-12-05 ENCOUNTER — Encounter: Payer: Self-pay | Admitting: Obstetrics & Gynecology

## 2018-12-05 ENCOUNTER — Other Ambulatory Visit: Payer: Self-pay

## 2018-12-05 VITALS — BP 110/86 | HR 106 | Temp 97.6°F | Ht 64.5 in | Wt 131.2 lb

## 2018-12-05 DIAGNOSIS — Z30431 Encounter for routine checking of intrauterine contraceptive device: Secondary | ICD-10-CM | POA: Diagnosis not present

## 2018-12-05 DIAGNOSIS — N920 Excessive and frequent menstruation with regular cycle: Secondary | ICD-10-CM | POA: Diagnosis not present

## 2018-12-05 NOTE — Progress Notes (Signed)
28 y.o. G0P0000 Single Caucasian female presents for followed up after insertion of Kyleena on 10/09/18.  Pt reports she is doing well since [facility] placement.  She did have cramping for about a week after the [facility] placement.  She did take ibuprofen and tylenol during this time as well.  She is having some light spotting that she sees almost daily but with wiping only.  She is not needing to wear a pad.    She is moving to a new condo this weekend.  She has a lot going on at work as well.  This is all just stressful for her especially considering the difficulty with Covid.    Has not been SA so cannot say is she is having pain with intercourse but cramping has fully resolved and she denies pelvic pain.  LMP:  No LMP recorded. (Menstrual status: IUD).  Patient Active Problem List   Diagnosis Date Noted  . White coat syndrome without diagnosis of hypertension 10/09/2018  . History of migraine headaches 08/29/2013  . Breast nodule 06/15/2013  . Visit for preventive health examination 06/15/2013  . Routine gynecological examination 06/15/2013  . Depression 01/16/2013  . Contraception management 04/27/2011  . Adjustment disorder with mixed anxiety and depressed mood 05/17/2010  . ALCOHOL USE 07/22/2009  . ATTENTION DEFICIT DISORDER 04/28/2007  . EXERCISE INDUCED ASTHMA 04/28/2007  . PRIMARY DYSMENORRHEA 04/28/2007  . ACNE VULGARIS 04/28/2007   Past Medical History:  Diagnosis Date  . ACNE VULGARIS 04/28/2007  . Alcohol abuse, in remission     hx of amnesia   . ALCOHOL USE 07/22/2009  . Anxiety   . ATTENTION DEFICIT DISORDER 04/28/2007    hx of speech therapy  . Bipolar disorder ([place])   . Depression   . EXERCISE INDUCED ASTHMA 04/28/2007  . History of pyelonephritis 05/2009  . HSV (herpes simplex virus) anogenital infection    Current Outpatient Medications on File Prior to Visit  Medication Sig Dispense Refill  . buPROPion (WELLBUTRIN XL) 150 MG 24 hr tablet Take 1 tablet (150 mg  total) by mouth daily. 30 tablet 5  . HYPERCARE 20 % external solution Apply externally to skin daily at bedtime 35 mL 0  . lamoTRIgine (LAMICTAL) 150 MG tablet Take 1 tablet (150 mg total) by mouth at bedtime. 30 tablet 5  . lisdexamfetamine (VYVANSE) 70 MG capsule Take 1 capsule (70 mg total) by mouth daily. 30 capsule 0  . Multiple Vitamin (MULTI-VITAMIN PO) Take by mouth.     No current facility-administered medications on file prior to visit.    Hornet venom  Review of Systems  All other systems reviewed and are negative.  Vitals:   12/05/18 1122  BP: 110/86  Pulse: (!) 106  Temp: 97.6 F (36.4 C)  TempSrc: Temporal  Weight: 131 lb 3.2 oz (59.5 kg)  Height: 5' 4.5" (1.638 m)    Gen:  WNWF healthy female NAD Abdomen: soft, non-tender Groin:  no inguinal nodes palpated  Pelvic exam: Vulva:  normal female genitalia Vagina:  normal vagina Cervix:  Non-tender, Negative CMT, no lesions or redness.  IUD string noted and is about 2cm.   Uterus:  normal shape, position and consistency    A: IUD follow-up after insertion of Kyleena IUD on 11/09/2018 Vaginal spotting  P:  Pt reassured about her spotting and that this is common.  IUD string is normal and uterus is non tender.  She is going to give me an update in 4 weeks but she knows  to call with any new concerns or problems.

## 2019-01-15 ENCOUNTER — Encounter: Payer: Self-pay | Admitting: Adult Health

## 2019-01-15 ENCOUNTER — Other Ambulatory Visit: Payer: Self-pay

## 2019-01-15 ENCOUNTER — Ambulatory Visit (INDEPENDENT_AMBULATORY_CARE_PROVIDER_SITE_OTHER): Payer: BC Managed Care – PPO | Admitting: Adult Health

## 2019-01-15 DIAGNOSIS — F331 Major depressive disorder, recurrent, moderate: Secondary | ICD-10-CM | POA: Diagnosis not present

## 2019-01-15 DIAGNOSIS — F909 Attention-deficit hyperactivity disorder, unspecified type: Secondary | ICD-10-CM

## 2019-01-15 DIAGNOSIS — F3181 Bipolar II disorder: Secondary | ICD-10-CM

## 2019-01-15 DIAGNOSIS — F411 Generalized anxiety disorder: Secondary | ICD-10-CM

## 2019-01-15 MED ORDER — LAMOTRIGINE 150 MG PO TABS: 150.0000 mg | ORAL_TABLET | Freq: Every day | ORAL | 5 refills | Status: DC

## 2019-01-15 MED ORDER — BUPROPION HCL ER (XL) 150 MG PO TB24: 150.0000 mg | ORAL_TABLET | Freq: Every day | ORAL | 5 refills | Status: DC

## 2019-01-15 MED ORDER — AMPHETAMINE-DEXTROAMPHET ER 30 MG PO CP24: 30.0000 mg | ORAL_CAPSULE | Freq: Every day | ORAL | 0 refills | Status: DC

## 2019-01-15 NOTE — Progress Notes (Signed)
Alexis LeKathleen A Youngman [id number] 05-12-1990 28 y.o.  Virtual Visit via Telephone Note  I connected with pt on 01/15/19 at  5:00 PM EST by telephone and verified that I am speaking with the correct person using two identifiers.   I discussed the limitations, risks, security and privacy concerns of performing an evaluation and management service by telephone and the availability of in person appointments. I also discussed with the patient that there may be a patient responsible charge related to this service. The patient expressed understanding and agreed to proceed.   I discussed the assessment and treatment plan with the patient. The patient was provided an opportunity to ask questions and all were answered. The patient agreed with the plan and demonstrated an understanding of the instructions.   The patient was advised to call back or seek an in-person evaluation if the symptoms worsen or if the condition fails to improve as anticipated.  I provided 30 minutes of non-face-to-face time during this encounter.  The patient was located at home.  The provider [facility]   Dorothyann Gibbsegina N Dolph Tavano, NP   Subjective:   Patient ID:  Alexis LeKathleen A Swett is a 28 y.o. (DOB 05-12-1990) female.  Chief Complaint: No chief complaint on file.  HPI  Alexis [name] presents for follow-up of ADHD, GAD, MDD, and BPD 2.   Describes mood today as "ok". Pleasant. Mood symptoms - denies depression, anxiety, and irritability. Stating "I've been doing pretty good". Concerned about switching from Vyvanse to Adderall XR. Insurance no longer covers Vyvanse and she cannot afford to pay for it out of pocket. Living independently. Working a full time job. Planning trip to [place] next year. Stable interest and motivation. Taking medications as prescribed.  Energy levels stable. Active, does not have a regular exercise routine. Works full-time. Getting overtime. Enjoys some usual interests and  activities. Lives alone. Spending time with family - parents local. Talking with friends.  Appetite adequate. Weight stable. Sleeps well most nights. Averages 6 to 8 hours. Focus and concentration stable. Completing tasks. Managing aspects of household. Work going well.  Denies SI or HI. Denies AH or VH.  Review of Systems:  Review of Systems  Musculoskeletal: Negative for gait problem.  Neurological: Negative for tremors and seizures.  Psychiatric/Behavioral: Negative for agitation, sleep disturbance and suicidal ideas. The patient is not nervous/anxious.        Please refer to HPI    Medications: I have reviewed the patient's current medications.  Current Outpatient Medications  Medication Sig Dispense Refill  . amphetamine-dextroamphetamine (ADDERALL XR) 30 MG 24 hr capsule Take 1 capsule (30 mg total) by mouth daily. 30 capsule 0  . buPROPion (WELLBUTRIN XL) 150 MG 24 hr tablet Take 1 tablet (150 mg total) by mouth daily. 30 tablet 5  . HYPERCARE 20 % external solution Apply externally to skin daily at bedtime 35 mL 0  . lamoTRIgine (LAMICTAL) 150 MG tablet Take 1 tablet (150 mg total) by mouth at bedtime. 30 tablet 5  . Multiple Vitamin (MULTI-VITAMIN PO) Take by mouth.     No current facility-administered medications for this visit.     Medication Side Effects: None  Allergies:  Allergies  Allergen Reactions  . Hornet Venom Anaphylaxis    Past Medical History:  Diagnosis Date  . ACNE VULGARIS 04/28/2007  . Alcohol abuse, in remission     hx of amnesia   . ALCOHOL USE 07/22/2009  . Anxiety   . ATTENTION DEFICIT DISORDER  04/28/2007    hx of speech therapy  . Bipolar disorder (HCC)   . Depression   . EXERCISE INDUCED ASTHMA 04/28/2007  . History of pyelonephritis 05/2009  . HSV (herpes simplex virus) anogenital infection     Family History  Problem Relation Age of Onset  . Heart attack Mother   . Asthma Mother   . Alcohol abuse Paternal Grandmother   . Depression  Paternal Grandmother   . Diabetes type II Maternal Grandmother   . Dementia Maternal Grandfather   . Stroke Paternal Grandfather     Social History   Socioeconomic History  . Marital status: Single    Spouse name: Not on file  . Number of children: Not on file  . Years of education: Not on file  . Highest education level: Not on file  Occupational History  . Not on file  Social Needs  . Financial resource strain: Not on file  . Food insecurity    Worry: Not on file    Inability: Not on file  . Transportation needs    Medical: Not on file    Non-medical: Not on file  Tobacco Use  . Smoking status: Current Every Day Smoker    Packs/day: 0.25    Types: Cigarettes  . Smokeless tobacco: Never Used  Substance and Sexual Activity  . Alcohol use: Yes    Alcohol/week: 14.0 - 20.0 standard drinks    Types: 14 - 20 Glasses of wine per week  . Drug use: No    Comment: In the past- Pot 3 months ago  . Sexual activity: Yes    Birth control/protection: Pill  Lifestyle  . Physical activity    Days per week: Not on file    Minutes per session: Not on file  . Stress: Not on file  Relationships  . Social Musician on phone: Not on file    Gets together: Not on file    Attends religious service: Not on file    Active member of club or organization: Not on file    Attends meetings of clubs or organizations: Not on file    Relationship status: Not on file  . Intimate partner violence    Fear of current or ex partner: Not on file    Emotionally abused: Not on file    Physically abused: Not on file    Forced sexual activity: Not on file  Other Topics Concern  . Not on file  Social History Narrative   [facility] history  And english  Dropped out temporarily getting psych help  On line courses  In past    No ets    [employer] 4    Volunteered  [employer]   Limited etoh   Stopped tobacco( now 2-3 x per week)   Working [employer] 15- 20 hours per week.   [employer] to  finish school degress    Past Medical History, Surgical history, Social history, and Family history were reviewed and updated as appropriate.   Please see review of systems for further details on the patient's review from today.   Objective:   Physical Exam:  There were no vitals taken for this visit.  Physical Exam  Constitutional: She is oriented to person, place, and time.  Neurological: She is alert and oriented to person, place, and time.  Psychiatric: Memory, affect and judgment normal. Her mood appears not anxious. She is not agitated. She does not exhibit a depressed mood. She expresses  no suicidal plans and no homicidal plans.    Lab Review:     Component Value Date/Time   NA 137 07/10/2014 0859   K 4.4 07/10/2014 0859   CL 107 07/10/2014 0859   CO2 27 07/10/2014 0859   GLUCOSE 90 07/10/2014 0859   BUN 15 07/10/2014 0859   CREATININE 0.83 07/10/2014 0859   CALCIUM 8.9 07/10/2014 0859   PROT 6.5 07/10/2014 0859   ALBUMIN 4.0 07/10/2014 0859   AST 12 07/10/2014 0859   ALT 16 07/10/2014 0859   ALKPHOS 39 07/10/2014 0859   BILITOT 0.3 07/10/2014 0859   GFRNONAA >90 08/24/2012 1346   GFRAA >90 08/24/2012 1346       Component Value Date/Time   WBC 5.7 07/10/2014 0859   RBC 4.61 07/10/2014 0859   HGB 13.9 07/10/2014 0859   HCT 40.1 07/10/2014 0859   PLT 238.0 07/10/2014 0859   MCV 87.1 07/10/2014 0859   MCV 90.3 08/21/2012 2004   MCH 28.8 08/24/2012 1346   MCHC 34.7 07/10/2014 0859   RDW 12.9 07/10/2014 0859   LYMPHSABS 2.8 07/10/2014 0859   MONOABS 0.4 07/10/2014 0859   EOSABS 0.1 07/10/2014 0859   BASOSABS 0.0 07/10/2014 0859    No results found for: POCLITH, LITHIUM   No results found for: PHENYTOIN, PHENOBARB, VALPROATE, CBMZ   .res Assessment: Plan:    Plan:  Discontinue - Vyvanse 70mg  daily - insurance no longer covering Add Adderall XR 30mg  daily to replace Vyvanse - will plan to adjust as needed Lamictal 150mg  daily Wellbutrin XL 150mg   daily  RTC 3 months  Patient advised to contact office with any questions, adverse effects, or acute worsening in signs and symptoms.  Discussed potential benefits, risks, and side effects of stimulants with patient to include increased heart rate, palpitations, insomnia, increased anxiety, increased irritability, or decreased appetite.  Instructed patient to contact office if experiencing any significant tolerability issues.  Counseled patient regarding potential benefits, risks, and side effects of Lamictal to include potential risk of Stevens-Johnson syndrome. Advised patient to stop taking Lamictal and contact office immediately if rash develops and to seek urgent medical attention if rash is severe and/or spreading quickly.   Diagnoses and all orders for this visit:  Bipolar II disorder ([place]) -     lamoTRIgine (LAMICTAL) 150 MG tablet; Take 1 tablet (150 mg total) by mouth at bedtime.  Major depressive disorder, recurrent episode, moderate (HCC) -     lamoTRIgine (LAMICTAL) 150 MG tablet; Take 1 tablet (150 mg total) by mouth at bedtime. -     buPROPion (WELLBUTRIN XL) 150 MG 24 hr tablet; Take 1 tablet (150 mg total) by mouth daily.  Generalized anxiety disorder -     buPROPion (WELLBUTRIN XL) 150 MG 24 hr tablet; Take 1 tablet (150 mg total) by mouth daily.  Attention deficit hyperactivity disorder (ADHD), unspecified ADHD type -     amphetamine-dextroamphetamine (ADDERALL XR) 30 MG 24 hr capsule; Take 1 capsule (30 mg total) by mouth daily.    Please see After Visit Summary for patient specific instructions.  No future appointments.  No orders of the defined types were placed in this encounter.     -------------------------------

## 2019-02-21 ENCOUNTER — Ambulatory Visit: Payer: BC Managed Care – PPO | Attending: Internal Medicine

## 2019-02-21 DIAGNOSIS — Z20822 Contact with and (suspected) exposure to covid-19: Secondary | ICD-10-CM | POA: Diagnosis not present

## 2019-02-22 LAB — NOVEL CORONAVIRUS, NAA: SARS-CoV-2, NAA: NOT DETECTED

## 2019-02-27 ENCOUNTER — Encounter: Payer: Self-pay | Admitting: Internal Medicine

## 2019-02-27 ENCOUNTER — Other Ambulatory Visit: Payer: Self-pay

## 2019-02-27 ENCOUNTER — Telehealth (INDEPENDENT_AMBULATORY_CARE_PROVIDER_SITE_OTHER): Payer: BC Managed Care – PPO | Admitting: Internal Medicine

## 2019-02-27 DIAGNOSIS — B9789 Other viral agents as the cause of diseases classified elsewhere: Secondary | ICD-10-CM | POA: Diagnosis not present

## 2019-02-27 DIAGNOSIS — J988 Other specified respiratory disorders: Secondary | ICD-10-CM

## 2019-02-27 NOTE — Progress Notes (Signed)
Virtual Visit via Video Note  I connected with@ on 02/27/19 at  2:30 PM EST by a video enabled telemedicine application and verified that I am speaking with the correct person using two identifiers. Location patient: home Location provider:work office Persons participating in the virtual visit: patient, provider  [place] national recommendations  regarding COVID 19 pandemic   video visit is advised over in office visit for this patient.  Patient aware  of the limitations of evaluation and management by telemedicine and  availability of in person appointments. and agreed to proceed.   HPI: Alexis [name] presents for video visit Had a mild sore throat Jan 6 or so and ok not a reg cold etc  Tested covid jan 6 negative  But did have  achy a perhaps low grade temp 99-100+  For a few days?  Not at work during this time to be sure  But now no fever feeling     Since Saturday  Not sure about the temp thermometer  No touch and oral  Last 99.1 and 98.8 range  n specific exposure   Although some at work  Not contaact have been positive in past ROS: See pertinent positives and negatives per HPI. recently started new adhd med not sure if that added to sx   Past Medical History:  Diagnosis Date  . ACNE VULGARIS 04/28/2007  . Alcohol abuse, in remission     hx of amnesia   . ALCOHOL USE 07/22/2009  . Anxiety   . ATTENTION DEFICIT DISORDER 04/28/2007    hx of speech therapy  . Bipolar disorder (HCC)   . Depression   . EXERCISE INDUCED ASTHMA 04/28/2007  . History of pyelonephritis 05/2009  . HSV (herpes simplex virus) anogenital infection     History reviewed. No pertinent surgical history.  Family History  Problem Relation Age of Onset  . Heart attack Mother   . Asthma Mother   . Alcohol abuse Paternal Grandmother   . Depression Paternal Grandmother   . Diabetes type II Maternal Grandmother   . Dementia Maternal Grandfather   . Stroke Paternal Grandfather     Social History    Tobacco Use  . Smoking status: Current Every Day Smoker    Packs/day: 0.25    Types: Cigarettes  . Smokeless tobacco: Never Used  Substance Use Topics  . Alcohol use: Yes    Alcohol/week: 14.0 - 20.0 standard drinks    Types: 14 - 20 Glasses of wine per week  . Drug use: No    Comment: In the past- Pot 3 months ago      Current Outpatient Medications:  .  amphetamine-dextroamphetamine (ADDERALL XR) 30 MG 24 hr capsule, Take 1 capsule (30 mg total) by mouth daily., Disp: 30 capsule, Rfl: 0 .  buPROPion (WELLBUTRIN XL) 150 MG 24 hr tablet, Take 1 tablet (150 mg total) by mouth daily., Disp: 30 tablet, Rfl: 5 .  HYPERCARE 20 % external solution, Apply externally to skin daily at bedtime, Disp: 35 mL, Rfl: 0 .  lamoTRIgine (LAMICTAL) 150 MG tablet, Take 1 tablet (150 mg total) by mouth at bedtime., Disp: 30 tablet, Rfl: 5 .  Multiple Vitamin (MULTI-VITAMIN PO), Take by mouth., Disp: , Rfl:   EXAM: BP Readings from Last 3 Encounters:  12/05/18 110/86  11/13/18 106/70  10/25/18 122/64    VITALS per patient if applicable:  GENERAL: alert, oriented, appears well and in no acute distress  HEENT: atraumatic, conjunttiva clear, no obvious abnormalities on  inspection of external nose and ears  NECK: normal movements of the head and neck  LUNGS: on inspection no signs of respiratory distress, breathing rate appears normal, no obvious gross SOB, gasping or wheezing  CV: no obvious cyanosis  MS: moves all visible extremities without noticeable abnormality  PSYCH/NEURO: pleasant and cooperative, no obvious depression or anxiety, speech and thought processing grossly intact   ASSESSMENT AND PLAN:  Discussed the following assessment and plan:    ICD-10-CM   1. Viral respiratory illness  J98.8    B97.89    mild resolved  based on hx  covid neg test   Should be ok to resume work tomorrow  8 days from onset and neg testing and no fever    But continue precautions .  Counseled.   Note for work via my chart   Expectant management and discussion of plan and treatment with opportunity to ask questions and all were answered. The patient agreed with the plan and demonstrated an understanding of the instructions.   Advised to call back or seek an in-person evaluation if worsening  or having  further concerns . Return if symptoms worsen or fail to improve.   Shanon Ace, MD

## 2019-03-26 ENCOUNTER — Other Ambulatory Visit: Payer: Self-pay

## 2019-03-26 ENCOUNTER — Telehealth: Payer: Self-pay | Admitting: Adult Health

## 2019-03-26 DIAGNOSIS — F909 Attention-deficit hyperactivity disorder, unspecified type: Secondary | ICD-10-CM

## 2019-03-26 MED ORDER — AMPHETAMINE-DEXTROAMPHET ER 30 MG PO CP24: 30.0000 mg | ORAL_CAPSULE | Freq: Every day | ORAL | 0 refills | Status: DC

## 2019-03-26 NOTE — Telephone Encounter (Signed)
Pt requesting refill on adderall at [facility] in [place]. Next appt 2/23

## 2019-03-26 NOTE — Telephone Encounter (Signed)
Last refill 02/19/2019, pended for Rene Kocher to approve

## 2019-04-10 ENCOUNTER — Encounter: Payer: Self-pay | Admitting: Adult Health

## 2019-04-10 ENCOUNTER — Ambulatory Visit (INDEPENDENT_AMBULATORY_CARE_PROVIDER_SITE_OTHER): Payer: BC Managed Care – PPO | Admitting: Adult Health

## 2019-04-10 DIAGNOSIS — F909 Attention-deficit hyperactivity disorder, unspecified type: Secondary | ICD-10-CM

## 2019-04-10 DIAGNOSIS — F3181 Bipolar II disorder: Secondary | ICD-10-CM

## 2019-04-10 DIAGNOSIS — F411 Generalized anxiety disorder: Secondary | ICD-10-CM | POA: Diagnosis not present

## 2019-04-10 DIAGNOSIS — F331 Major depressive disorder, recurrent, moderate: Secondary | ICD-10-CM | POA: Diagnosis not present

## 2019-04-10 MED ORDER — AMPHETAMINE-DEXTROAMPHET ER 30 MG PO CP24: 30.0000 mg | ORAL_CAPSULE | Freq: Every day | ORAL | 0 refills | Status: DC

## 2019-04-10 NOTE — Progress Notes (Signed)
Alexis [name] [id number] 1990/08/15 29 y.o.  Virtual Visit via Telephone Note  I connected with pt on 04/10/19 at  1:00 PM EST by telephone and verified that I am speaking with the correct person using two identifiers.   I discussed the limitations, risks, security and privacy concerns of performing an evaluation and management service by telephone and the availability of in person appointments. I also discussed with the patient that there may be a patient responsible charge related to this service. The patient expressed understanding and agreed to proceed.   I discussed the assessment and treatment plan with the patient. The patient was provided an opportunity to ask questions and all were answered. The patient agreed with the plan and demonstrated an understanding of the instructions.   The patient was advised to call back or seek an in-person evaluation if the symptoms worsen or if the condition fails to improve as anticipated.  I provided 30 minutes of non-face-to-face time during this encounter.  The patient was located at home.  The provider was located at [place].   Alexis Gell, NP   Subjective:   Patient ID:  Alexis [name] is a 29 y.o. (DOB 30-Jan-1991) female.  Chief Complaint:  Chief Complaint  Patient presents with  . Anxiety  . Depression  . ADHD  . Other    BPD 2    HPI Alexis [name] presents for follow-up of ADHD, GAD, MDD, and BPD 2.   Describes mood today as "ok". Pleasant. Mood symptoms - denies depression, anxiety, and irritability. Stating "I'm doing pretty good". Has moved out on her own. Keeping things "neat and clean" at her new place. Doesn't "enjoy keeping things clean, but feels like it look nice". Stating "as long as I can keep my momentum, I'm ok". Living alone - "things aren't as scary". Not sure "what to do" with her own space. Visiting parents once a week. 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  Dating - "have been friends in the  past". Saving money for her "[place]" trip. Stable interest and motivation. Taking medications as prescribed.  Energy levels stable. Active, does not have a regular exercise routine. Works full-time.  Enjoys some usual interests and activities. Lives alone. Dating. Spending time with family - parents local. Talking with friends.  Appetite adequate. Weight stable. Sleeps well most nights. Averages 6 to 8 hours. Focus and concentration stable. Completing tasks. Managing aspects of household. Work going well.  Denies SI or HI. Denies AH or VH.  Review of Systems:  Review of Systems  Musculoskeletal: Negative for gait problem.  Neurological: Negative for tremors.  Psychiatric/Behavioral:       Please refer to HPI    Medications: I have reviewed the patient's current medications.  Current Outpatient Medications  Medication Sig Dispense Refill  . amphetamine-dextroamphetamine (ADDERALL XR) 30 MG 24 hr capsule Take 1 capsule (30 mg total) by mouth daily. 30 capsule 0  . [START ON 05/08/2019] amphetamine-dextroamphetamine (ADDERALL XR) 30 MG 24 hr capsule Take 1 capsule (30 mg total) by mouth daily. 30 capsule 0  . [START ON 06/05/2019] amphetamine-dextroamphetamine (ADDERALL XR) 30 MG 24 hr capsule Take 1 capsule (30 mg total) by mouth daily. 30 capsule 0  . buPROPion (WELLBUTRIN XL) 150 MG 24 hr tablet Take 1 tablet (150 mg total) by mouth daily. 30 tablet 5  . HYPERCARE 20 % external solution Apply externally to skin daily at bedtime 35 mL 0  . lamoTRIgine (LAMICTAL) 150 MG tablet Take 1 tablet (  150 mg total) by mouth at bedtime. 30 tablet 5  . Multiple Vitamin (MULTI-VITAMIN PO) Take by mouth.     No current facility-administered medications for this visit.    Medication Side Effects: None  Allergies:  Allergies  Allergen Reactions  . Hornet Venom Anaphylaxis    Past Medical History:  Diagnosis Date  . ACNE VULGARIS 04/28/2007  . Alcohol abuse, in remission     hx of amnesia   .  ALCOHOL USE 07/22/2009  . Anxiety   . ATTENTION DEFICIT DISORDER 04/28/2007    hx of speech therapy  . Bipolar disorder (HCC)   . Depression   . EXERCISE INDUCED ASTHMA 04/28/2007  . History of pyelonephritis 05/2009  . HSV (herpes simplex virus) anogenital infection     Family History  Problem Relation Age of Onset  . Heart attack Mother   . Asthma Mother   . Alcohol abuse Paternal Grandmother   . Depression Paternal Grandmother   . Diabetes type II Maternal Grandmother   . Dementia Maternal Grandfather   . Stroke Paternal Grandfather     Social History   Socioeconomic History  . Marital status: Single    Spouse name: Not on file  . Number of children: Not on file  . Years of education: Not on file  . Highest education level: Not on file  Occupational History  . Not on file  Tobacco Use  . Smoking status: Current Every Day Smoker    Packs/day: 0.25    Types: Cigarettes  . Smokeless tobacco: Never Used  Substance and Sexual Activity  . Alcohol use: Yes    Alcohol/week: 14.0 - 20.0 standard drinks    Types: 14 - 20 Glasses of wine per week  . Drug use: No    Comment: In the past- Pot 3 months ago  . Sexual activity: Yes    Birth control/protection: Pill  Other Topics Concern  . Not on file  Social History Narrative   Ms [facility] history  And english  Dropped out temporarily getting psych help  On line courses  In past    No ets    [employer]    Volunteered  [employer]   Limited etoh   Stopped tobacco( now 2-3 x per week)   Working [employer] 15- 20 hours per week.   [employer] to finish school degress   Social Determinants of Health   Financial Resource Strain:   . Difficulty of Paying Living Expenses: Not on file  Food Insecurity:   . Worried About Programme researcher, broadcasting/film/video in the Last Year: Not on file  . Ran Out of Food in the Last Year: Not on file  Transportation Needs:   . Lack of Transportation (Medical): Not on file  . Lack of Transportation  (Non-Medical): Not on file  Physical Activity:   . Days of Exercise per Week: Not on file  . Minutes of Exercise per Session: Not on file  Stress:   . Feeling of Stress : Not on file  Social Connections:   . Frequency of Communication with Friends and Family: Not on file  . Frequency of Social Gatherings with Friends and Family: Not on file  . Attends Religious Services: Not on file  . Active Member of Clubs or Organizations: Not on file  . Attends Banker Meetings: Not on file  . Marital Status: Not on file  Intimate Partner Violence:   . Fear of Current or Ex-Partner: Not on file  .  Emotionally Abused: Not on file  . Physically Abused: Not on file  . Sexually Abused: Not on file    Past Medical History, Surgical history, Social history, and Family history were reviewed and updated as appropriate.   Please see review of systems for further details on the patient's review from today.   Objective:   Physical Exam:  There were no vitals taken for this visit.  Physical Exam Constitutional:      General: She is not in acute distress.    Appearance: She is well-developed.  Musculoskeletal:        General: No deformity.  Neurological:     Mental Status: She is alert and oriented to person, place, and time.     Coordination: Coordination normal.  Psychiatric:        Attention and Perception: Attention and perception normal. She does not perceive auditory or visual hallucinations.        Mood and Affect: Mood normal. Mood is not anxious or depressed. Affect is not labile, blunt, angry or inappropriate.        Speech: Speech normal.        Behavior: Behavior normal.        Thought Content: Thought content normal. Thought content is not paranoid or delusional. Thought content does not include homicidal or suicidal ideation. Thought content does not include homicidal or suicidal plan.        Cognition and Memory: Cognition and memory normal.        Judgment: Judgment  normal.     Comments: Insight intact     Lab Review:     Component Value Date/Time   NA 137 07/10/2014 0859   K 4.4 07/10/2014 0859   CL 107 07/10/2014 0859   CO2 27 07/10/2014 0859   GLUCOSE 90 07/10/2014 0859   BUN 15 07/10/2014 0859   CREATININE 0.83 07/10/2014 0859   CALCIUM 8.9 07/10/2014 0859   PROT 6.5 07/10/2014 0859   ALBUMIN 4.0 07/10/2014 0859   AST 12 07/10/2014 0859   ALT 16 07/10/2014 0859   ALKPHOS 39 07/10/2014 0859   BILITOT 0.3 07/10/2014 0859   GFRNONAA >90 08/24/2012 1346   GFRAA >90 08/24/2012 1346       Component Value Date/Time   WBC 5.7 07/10/2014 0859   RBC 4.61 07/10/2014 0859   HGB 13.9 07/10/2014 0859   HCT 40.1 07/10/2014 0859   PLT 238.0 07/10/2014 0859   MCV 87.1 07/10/2014 0859   MCV 90.3 08/21/2012 2004   MCH 28.8 08/24/2012 1346   MCHC 34.7 07/10/2014 0859   RDW 12.9 07/10/2014 0859   LYMPHSABS 2.8 07/10/2014 0859   MONOABS 0.4 07/10/2014 0859   EOSABS 0.1 07/10/2014 0859   BASOSABS 0.0 07/10/2014 0859    No results found for: POCLITH, LITHIUM   No results found for: PHENYTOIN, PHENOBARB, VALPROATE, CBMZ   .res Assessment: Plan:    Plan:  Adderall XR 30mg  daily Lamictal 150mg  daily Wellbutrin XL 150mg  daily  RTC 3 months  Patient advised to contact office with any questions, adverse effects, or acute worsening in signs and symptoms.  Discussed potential benefits, risks, and side effects of stimulants with patient to include increased heart rate, palpitations, insomnia, increased anxiety, increased irritability, or decreased appetite.  Instructed patient to contact office if experiencing any significant tolerability issues.  Counseled patient regarding potential benefits, risks, and side effects of Lamictal to include potential risk of Stevens-Johnson syndrome. Advised patient to stop taking Lamictal and contact office immediately  if rash develops and to seek urgent medical attention if rash is severe and/or spreading  quickly.   Cyleigh was seen today for anxiety, depression, adhd and other.  Diagnoses and all orders for this visit:  Attention deficit hyperactivity disorder (ADHD), unspecified ADHD type -     amphetamine-dextroamphetamine (ADDERALL XR) 30 MG 24 hr capsule; Take 1 capsule (30 mg total) by mouth daily. -     amphetamine-dextroamphetamine (ADDERALL XR) 30 MG 24 hr capsule; Take 1 capsule (30 mg total) by mouth daily. -     amphetamine-dextroamphetamine (ADDERALL XR) 30 MG 24 hr capsule; Take 1 capsule (30 mg total) by mouth daily.  Bipolar II disorder (HCC)  Generalized anxiety disorder  Major depressive disorder, recurrent episode, moderate (HCC)    Please see After Visit Summary for patient specific instructions.  No future appointments.  No orders of the defined types were placed in this encounter.     -------------------------------

## 2019-05-19 ENCOUNTER — Emergency Department (HOSPITAL_COMMUNITY)
Admission: EM | Admit: 2019-05-19 | Discharge: 2019-05-20 | Disposition: A | Discharge status: Home / Self Care | Payer: BC Managed Care – PPO | Attending: Emergency Medicine | Admitting: Emergency Medicine

## 2019-05-19 DIAGNOSIS — F17210 Nicotine dependence, cigarettes, uncomplicated: Secondary | ICD-10-CM | POA: Diagnosis not present

## 2019-05-19 DIAGNOSIS — R404 Transient alteration of awareness: Secondary | ICD-10-CM | POA: Diagnosis not present

## 2019-05-19 DIAGNOSIS — F10920 Alcohol use, unspecified with intoxication, uncomplicated: Secondary | ICD-10-CM

## 2019-05-19 DIAGNOSIS — F10929 Alcohol use, unspecified with intoxication, unspecified: Secondary | ICD-10-CM | POA: Diagnosis present

## 2019-05-19 DIAGNOSIS — Y908 Blood alcohol level of 240 mg/100 ml or more: Secondary | ICD-10-CM | POA: Diagnosis not present

## 2019-05-19 DIAGNOSIS — R000 Tachycardia, unspecified: Secondary | ICD-10-CM | POA: Diagnosis not present

## 2019-05-19 DIAGNOSIS — Z79899 Other long term (current) drug therapy: Secondary | ICD-10-CM | POA: Diagnosis not present

## 2019-05-19 DIAGNOSIS — R4020 Unspecified coma: Secondary | ICD-10-CM | POA: Diagnosis not present

## 2019-05-19 LAB — CBC WITH DIFFERENTIAL/PLATELET
Abs Immature Granulocytes: 0.03 10*3/uL (ref 0.00–0.07)
Basophils Absolute: 0 10*3/uL (ref 0.0–0.1)
Basophils Relative: 0 %
Eosinophils Absolute: 0 10*3/uL (ref 0.0–0.5)
Eosinophils Relative: 0 %
HCT: 49.3 % — ABNORMAL HIGH (ref 36.0–46.0)
Hemoglobin: 16.8 g/dL — ABNORMAL HIGH (ref 12.0–15.0)
Immature Granulocytes: 0 %
Lymphocytes Relative: 23 %
Lymphs Abs: 2.2 10*3/uL (ref 0.7–4.0)
MCH: 31.3 pg (ref 26.0–34.0)
MCHC: 34.1 g/dL (ref 30.0–36.0)
MCV: 91.8 fL (ref 80.0–100.0)
Monocytes Absolute: 0.5 10*3/uL (ref 0.1–1.0)
Monocytes Relative: 5 %
Neutro Abs: 7 10*3/uL (ref 1.7–7.7)
Neutrophils Relative %: 72 %
Platelets: 297 10*3/uL (ref 150–400)
RBC: 5.37 MIL/uL — ABNORMAL HIGH (ref 3.87–5.11)
RDW: 11.9 % (ref 11.5–15.5)
WBC: 9.8 10*3/uL (ref 4.0–10.5)
nRBC: 0 % (ref 0.0–0.2)

## 2019-05-19 LAB — I-STAT BETA HCG BLOOD, ED (MC, WL, AP ONLY): I-stat hCG, quantitative: <5 m[IU]/mL (ref <5)

## 2019-05-19 LAB — CBG MONITORING, ED: Glucose-Capillary: 91 mg/dL (ref 70–99)

## 2019-05-19 NOTE — ED Triage Notes (Signed)
BIB EMS from bar. Called out for alcohol intoxication. VSS.

## 2019-05-19 NOTE — ED Provider Notes (Signed)
[facility] EMERGENCY DEPARTMENT Provider Note   CSN: [id number] Arrival date & time: 05/19/19  2231     History Chief Complaint  Patient presents with  . Alcohol Intoxication    Alexis [name] is a 29 y.o. female.  Patient presents to the emergency department with a chief complaint of alcohol intoxication.  She is brought in by EMS.  She had been drinking with friends at a bar tonight.  She is significantly intoxicated.  Level 5 caveat secondary to alcohol intoxication.  The history is provided by the patient. No language interpreter was used.       Past Medical History:  Diagnosis Date  . ACNE VULGARIS 04/28/2007  . Alcohol abuse, in remission     hx of amnesia   . ALCOHOL USE 07/22/2009  . Anxiety   . ATTENTION DEFICIT DISORDER 04/28/2007    hx of speech therapy  . Bipolar disorder (HCC)   . Depression   . EXERCISE INDUCED ASTHMA 04/28/2007  . History of pyelonephritis 05/2009  . HSV (herpes simplex virus) anogenital infection     Patient Active Problem List   Diagnosis Date Noted  . White coat syndrome without diagnosis of hypertension 10/09/2018  . History of migraine headaches 08/29/2013  . Breast nodule 06/15/2013  . Visit for preventive health examination 06/15/2013  . Routine gynecological examination 06/15/2013  . Depression 01/16/2013  . Contraception management 04/27/2011  . Adjustment disorder with mixed anxiety and depressed mood 05/17/2010  . ALCOHOL USE 07/22/2009  . ATTENTION DEFICIT DISORDER 04/28/2007  . EXERCISE INDUCED ASTHMA 04/28/2007  . PRIMARY DYSMENORRHEA 04/28/2007  . ACNE VULGARIS 04/28/2007    No past surgical history on file.   OB History    Gravida  0   Para  0   Term  0   Preterm  0   AB  0   Living  0     SAB  0   TAB  0   Ectopic  0   Multiple  0   Live Births  0           Family History  Problem Relation Age of Onset  . Heart attack Mother   . Asthma Mother   . Alcohol abuse  Paternal Grandmother   . Depression Paternal Grandmother   . Diabetes type II Maternal Grandmother   . Dementia Maternal Grandfather   . Stroke Paternal Grandfather     Social History   Tobacco Use  . Smoking status: Current Every Day Smoker    Packs/day: 0.25    Types: Cigarettes  . Smokeless tobacco: Never Used  Substance Use Topics  . Alcohol use: Yes    Alcohol/week: 14.0 - 20.0 standard drinks    Types: 14 - 20 Glasses of wine per week  . Drug use: No    Comment: In the past- Pot 3 months ago    Home Medications Prior to Admission medications   Medication Sig Start Date End Date Taking? Authorizing Provider  amphetamine-dextroamphetamine (ADDERALL XR) 30 MG 24 hr capsule Take 1 capsule (30 mg total) by mouth daily. 04/10/19   Mozingo, Thereasa Solo, NP  amphetamine-dextroamphetamine (ADDERALL XR) 30 MG 24 hr capsule Take 1 capsule (30 mg total) by mouth daily. 05/08/19   Mozingo, Thereasa Solo, NP  amphetamine-dextroamphetamine (ADDERALL XR) 30 MG 24 hr capsule Take 1 capsule (30 mg total) by mouth daily. 06/05/19   Mozingo, Thereasa Solo, NP  buPROPion (WELLBUTRIN XL) 150 MG 24 hr tablet  Take 1 tablet (150 mg total) by mouth daily. 01/15/19   Mozingo, Berdie Ogren, NP  HYPERCARE 20 % external solution Apply externally to skin daily at bedtime 02/04/13   Panosh, Standley Brooking, MD  lamoTRIgine (LAMICTAL) 150 MG tablet Take 1 tablet (150 mg total) by mouth at bedtime. 01/15/19   Mozingo, Berdie Ogren, NP  Multiple Vitamin (MULTI-VITAMIN PO) Take by mouth.    [provider]    Allergies    Hornet venom  Review of Systems   Review of Systems  Unable to perform ROS: Mental status change    Physical Exam Updated Vital Signs BP 113/73 (BP Location: Right Arm)   Pulse 98   Temp 97.6 F (36.4 C) (Oral)   Resp (!) 98   SpO2 96%   Physical Exam Vitals and nursing note reviewed.  Constitutional:      General: She is not in acute distress.    Appearance:  She is well-developed.  HENT:     Head: Normocephalic and atraumatic.  Eyes:     Conjunctiva/sclera: Conjunctivae normal.  Cardiovascular:     Rate and Rhythm: Normal rate and regular rhythm.     Heart sounds: No murmur.  Pulmonary:     Effort: Pulmonary effort is normal. No respiratory distress.     Breath sounds: Normal breath sounds.  Abdominal:     Palpations: Abdomen is soft.     Tenderness: There is no abdominal tenderness.  Musculoskeletal:     Cervical back: Neck supple.  Skin:    General: Skin is warm and dry.  Neurological:     Mental Status: She is alert.     Comments: Awake, responding "I am fine" to any questions asked  Psychiatric:     Comments: Intoxicated     ED Results / Procedures / Treatments   Labs (all labs ordered are listed, but only abnormal results are displayed) Labs Reviewed - No data to display  EKG None  Radiology No results found.  Procedures Procedures (including critical care time)  Medications Ordered in ED Medications - No data to display  ED Course  I have reviewed the triage vital signs and the nursing notes.  Pertinent labs & imaging results that were available during my care of the patient were reviewed by me and considered in my medical decision making (see chart for details).    MDM Rules/Calculators/A&P                      Patient presents heavily intoxicated.  Presumably from ETOH, but will check for co-ingestants given that patient is unable to answer questions appropriately.  Ethanol level is significantly elevated at 376.  No other significant laboratory abnormalities.  Patient does not have a ride home, and does not have anyone to look after her.  For her safety, she will need to sober up in the emergency department prior to discharge.  3:38 AM Patient is standing, walking, alert, oriented, and drinking.  She is requesting discharge at this time.  She states that she will take an Saline. Final Clinical  Impression(s) / ED Diagnoses Final diagnoses:  Alcoholic intoxication without complication [facility])    Rx / DC Orders ED Discharge Orders    None       Montine Circle, PA-C 05/20/19 1696    Virgel Manifold, MD 05/20/19 2118

## 2019-05-19 NOTE — ED Notes (Deleted)
Woodward Ku, friend, [phone] would like an update when available

## 2019-05-20 LAB — COMPREHENSIVE METABOLIC PANEL
ALT: 19 U/L (ref 0–44)
AST: 18 U/L (ref 15–41)
Albumin: 4.6 g/dL (ref 3.5–5.0)
Alkaline Phosphatase: 80 U/L (ref 38–126)
Anion gap: 13 (ref 5–15)
BUN: 10 mg/dL (ref 6–20)
CO2: 21 mmol/L — ABNORMAL LOW (ref 22–32)
Calcium: 9.2 mg/dL (ref 8.9–10.3)
Chloride: 109 mmol/L (ref 98–111)
Creatinine, Ser: 0.5 mg/dL (ref 0.44–1.00)
GFR calc Af Amer: >60 mL/min (ref >60)
GFR calc non Af Amer: >60 mL/min (ref >60)
Glucose, Bld: 92 mg/dL (ref 70–99)
Potassium: 4 mmol/L (ref 3.5–5.1)
Sodium: 143 mmol/L (ref 135–145)
Total Bilirubin: 0.4 mg/dL (ref 0.3–1.2)
Total Protein: 8.6 g/dL — ABNORMAL HIGH (ref 6.5–8.1)

## 2019-05-20 LAB — SALICYLATE LEVEL: Salicylate Lvl: <7 mg/dL — ABNORMAL LOW (ref 7.0–30.0)

## 2019-05-20 LAB — ETHANOL: Alcohol, Ethyl (B): 376 mg/dL (ref <10)

## 2019-05-20 LAB — ACETAMINOPHEN LEVEL: Acetaminophen (Tylenol), Serum: <10 ug/mL — ABNORMAL LOW (ref 10–30)

## 2019-05-20 NOTE — ED Notes (Signed)
Pt awake and requesting to be discharged home. Rob, PA notified.

## 2019-05-20 NOTE — ED Notes (Signed)
Pt very upset that she was brought to the hospital. Pt repeating multiple times that she would have been fine if she was left there. Pt upset that she will get a bill for "this experience she didn't want." Pt asked registration if there is a way she can refuse all care and transport in the future when she is not able to speak for herself. Pt is yelling at staff stating "I'm sure I'm going to pay for this."

## 2019-05-20 NOTE — ED Notes (Signed)
Pt refuse discharge vital signs stating "I would really just like to take my things and leave because this is going to be over $1000."

## 2019-05-20 NOTE — ED Notes (Signed)
Pt refused vital signs at time of discharge. Given discharge paperwork, refused signature. This RN asked if pt had a ride home and pt stated that she did.

## 2019-06-22 ENCOUNTER — Other Ambulatory Visit: Payer: Self-pay | Admitting: Adult Health

## 2019-06-22 DIAGNOSIS — F331 Major depressive disorder, recurrent, moderate: Secondary | ICD-10-CM

## 2019-06-22 DIAGNOSIS — F411 Generalized anxiety disorder: Secondary | ICD-10-CM

## 2019-08-02 ENCOUNTER — Encounter: Payer: Self-pay | Admitting: Adult Health

## 2019-08-02 ENCOUNTER — Telehealth (INDEPENDENT_AMBULATORY_CARE_PROVIDER_SITE_OTHER): Payer: BC Managed Care – PPO | Admitting: Adult Health

## 2019-08-02 ENCOUNTER — Telehealth: Payer: Self-pay | Admitting: Adult Health

## 2019-08-02 DIAGNOSIS — F411 Generalized anxiety disorder: Secondary | ICD-10-CM | POA: Diagnosis not present

## 2019-08-02 DIAGNOSIS — F331 Major depressive disorder, recurrent, moderate: Secondary | ICD-10-CM

## 2019-08-02 DIAGNOSIS — F3181 Bipolar II disorder: Secondary | ICD-10-CM

## 2019-08-02 DIAGNOSIS — F909 Attention-deficit hyperactivity disorder, unspecified type: Secondary | ICD-10-CM | POA: Diagnosis not present

## 2019-08-02 MED ORDER — AMPHETAMINE-DEXTROAMPHET ER 30 MG PO CP24: 30.0000 mg | ORAL_CAPSULE | Freq: Every day | ORAL | 0 refills | Status: DC

## 2019-08-02 MED ORDER — BUPROPION HCL ER (XL) 150 MG PO TB24: 150.0000 mg | ORAL_TABLET | Freq: Every day | ORAL | 3 refills | Status: DC

## 2019-08-02 MED ORDER — LAMOTRIGINE 150 MG PO TABS: 150.0000 mg | ORAL_TABLET | Freq: Every day | ORAL | 3 refills | Status: DC

## 2019-08-02 NOTE — Progress Notes (Signed)
Alexis [name] [id number] September 22, 1990 29 y.o.  Virtual Visit via Telephone Note  I connected with pt on 08/02/19 at  3:00 PM EDT by telephone and verified that I am speaking with the correct person using two identifiers.   I discussed the limitations, risks, security and privacy concerns of performing an evaluation and management service by telephone and the availability of in person appointments. I also discussed with the patient that there may be a patient responsible charge related to this service. The patient expressed understanding and agreed to proceed.   I discussed the assessment and treatment plan with the patient. The patient was provided an opportunity to ask questions and all were answered. The patient agreed with the plan and demonstrated an understanding of the instructions.   The patient was advised to call back or seek an in-person evaluation if the symptoms worsen or if the condition fails to improve as anticipated.  I provided 30 minutes of non-face-to-face time during this encounter.  The patient was located at home.  The provider was located at [facility].   Dorothyann Gibbs, NP   Subjective:   Patient ID:  Alexis [name] is a 29 y.o. (DOB 05/16/1990) female.  Chief Complaint:  No chief complaint on file.   HPI  Alexis [name] presents for follow-up of ADHD, GAD, MDD, and BPD 2.   Describes mood today as "ok". Pleasant. Mood symptoms - denies depression, anxiety, and irritability. Stating "I've been doing well". Feels like things are "reasonable". Living alone - "it's been an adjustment". Seeing mother every day at work. Having dinner with family on Sundays. Recently got a raise at work. Thinking about getting a pet. Recently went out with a friend. Planning a trip to [place]. Stable interest and motivation. Taking medications as prescribed.  Energy levels stable. Active, does not have a regular exercise routine. Works full-time.  Enjoys  some usual interests and activities. Single. Lives alone. Spending time with family - parents local. Talking with friends.  Appetite adequate. Weight stable. Sleeps well most nights. Averages 6 to 8 hours. Focus and concentration stable. Completing tasks. Managing aspects of household. Work going well.  Denies SI or HI. Denies AH or VH.  Review of Systems:  Review of Systems  Musculoskeletal: Negative for gait problem.  Neurological: Negative for tremors.  Psychiatric/Behavioral:       Please refer to HPI    Medications: I have reviewed the patient's current medications.  Current Outpatient Medications  Medication Sig Dispense Refill  . amphetamine-dextroamphetamine (ADDERALL XR) 30 MG 24 hr capsule Take 1 capsule (30 mg total) by mouth daily. 30 capsule 0  . [START ON 08/30/2019] amphetamine-dextroamphetamine (ADDERALL XR) 30 MG 24 hr capsule Take 1 capsule (30 mg total) by mouth daily. 30 capsule 0  . [START ON 09/27/2019] amphetamine-dextroamphetamine (ADDERALL XR) 30 MG 24 hr capsule Take 1 capsule (30 mg total) by mouth daily. 30 capsule 0  . buPROPion (WELLBUTRIN XL) 150 MG 24 hr tablet Take 1 tablet (150 mg total) by mouth daily. 90 tablet 3  . HYPERCARE 20 % external solution Apply externally to skin daily at bedtime 35 mL 0  . lamoTRIgine (LAMICTAL) 150 MG tablet Take 1 tablet (150 mg total) by mouth at bedtime. 90 tablet 3  . Multiple Vitamin (MULTI-VITAMIN PO) Take by mouth.     No current facility-administered medications for this visit.    Medication Side Effects: None  Allergies:  Allergies  Allergen Reactions  . Hornet Venom  Anaphylaxis    Past Medical History:  Diagnosis Date  . ACNE VULGARIS 04/28/2007  . Alcohol abuse, in remission     hx of amnesia   . ALCOHOL USE 07/22/2009  . Anxiety   . ATTENTION DEFICIT DISORDER 04/28/2007    hx of speech therapy  . Bipolar disorder ([place])   . Depression   . EXERCISE INDUCED ASTHMA 04/28/2007  . History of  pyelonephritis 05/2009  . HSV (herpes simplex virus) anogenital infection     Family History  Problem Relation Age of Onset  . Heart attack Mother   . Asthma Mother   . Alcohol abuse Paternal Grandmother   . Depression Paternal Grandmother   . Diabetes type II Maternal Grandmother   . Dementia Maternal Grandfather   . Stroke Paternal Grandfather     Social History   Socioeconomic History  . Marital status: Single    Spouse name: Not on file  . Number of children: Not on file  . Years of education: Not on file  . Highest education level: Not on file  Occupational History  . Not on file  Tobacco Use  . Smoking status: Current Every Day Smoker    Packs/day: 0.25    Types: Cigarettes  . Smokeless tobacco: Never Used  Vaping Use  . Vaping Use: Never used  Substance and Sexual Activity  . Alcohol use: Yes    Alcohol/week: 14.0 - 20.0 standard drinks    Types: 14 - 20 Glasses of wine per week  . Drug use: No    Comment: In the past- Pot 3 months ago  . Sexual activity: Yes    Birth control/protection: Pill  Other Topics Concern  . 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   [employer] to finish school degress   Social Determinants of Health   Financial Resource Strain:   . Difficulty of Paying Living Expenses:   Food Insecurity:   . Worried About Charity fundraiser in the Last Year:   . Arboriculturist in the Last Year:   Transportation Needs:   . Film/video editor (Medical):   Marland Kitchen Lack of Transportation (Non-Medical):   Physical Activity:   . Days of Exercise per Week:   . Minutes of Exercise per Session:   Stress:   . Feeling of Stress :   Social Connections:   . Frequency of Communication with Friends  and Family:   . Frequency of Social Gatherings with Friends and Family:   . Attends Religious Services:   . Active Member of Clubs or Organizations:   . Attends Archivist Meetings:   Marland Kitchen Marital Status:   Intimate Partner Violence:   . Fear of Current or Ex-Partner:   . Emotionally Abused:   Marland Kitchen Physically Abused:   . Sexually Abused:     Past Medical History, Surgical history, Social history, and Family history were reviewed and updated as appropriate.   Please see review of systems for further details on the patient's review from today.   Objective:   Physical Exam:  There were no vitals taken for this visit.  Physical Exam Constitutional:  General: She is not in acute distress.    Appearance: She is well-developed.  Musculoskeletal:        General: No deformity.  Neurological:     Mental Status: She is alert and oriented to person, place, and time.     Coordination: Coordination normal.  Psychiatric:        Attention and Perception: Attention and perception normal. She does not perceive auditory or visual hallucinations.        Mood and Affect: Mood normal. Mood is not anxious or depressed. Affect is not labile, blunt, angry or inappropriate.        Speech: Speech normal.        Behavior: Behavior normal.        Thought Content: Thought content normal. Thought content is not paranoid or delusional. Thought content does not include homicidal or suicidal ideation. Thought content does not include homicidal or suicidal plan.        Cognition and Memory: Cognition and memory normal.        Judgment: Judgment normal.     Comments: Insight intact     Lab Review:     Component Value Date/Time   NA 143 05/19/2019 2245   K 4.0 05/19/2019 2245   CL 109 05/19/2019 2245   CO2 21 (L) 05/19/2019 2245   GLUCOSE 92 05/19/2019 2245   BUN 10 05/19/2019 2245   CREATININE 0.50 05/19/2019 2245   CALCIUM 9.2 05/19/2019 2245   PROT 8.6 (H) 05/19/2019 2245   ALBUMIN 4.6  05/19/2019 2245   AST 18 05/19/2019 2245   ALT 19 05/19/2019 2245   ALKPHOS 80 05/19/2019 2245   BILITOT 0.4 05/19/2019 2245   GFRNONAA >60 05/19/2019 2245   GFRAA >60 05/19/2019 2245       Component Value Date/Time   WBC 9.8 05/19/2019 2245   RBC 5.37 (H) 05/19/2019 2245   HGB 16.8 (H) 05/19/2019 2245   HCT 49.3 (H) 05/19/2019 2245   PLT 297 05/19/2019 2245   MCV 91.8 05/19/2019 2245   MCV 90.3 08/21/2012 2004   MCH 31.3 05/19/2019 2245   MCHC 34.1 05/19/2019 2245   RDW 11.9 05/19/2019 2245   LYMPHSABS 2.2 05/19/2019 2245   MONOABS 0.5 05/19/2019 2245   EOSABS 0.0 05/19/2019 2245   BASOSABS 0.0 05/19/2019 2245    No results found for: POCLITH, LITHIUM   No results found for: PHENYTOIN, PHENOBARB, VALPROATE, CBMZ   .res Assessment: Plan:    Plan:  Adderall XR 30mg  daily Lamictal 150mg  daily Wellbutrin XL 150mg  daily  RTC 3 months  Patient advised to contact office with any questions, adverse effects, or acute worsening in signs and symptoms.  Discussed potential benefits, risks, and side effects of stimulants with patient to include increased heart rate, palpitations, insomnia, increased anxiety, increased irritability, or decreased appetite.  Instructed patient to contact office if experiencing any significant tolerability issues.  Counseled patient regarding potential benefits, risks, and side effects of Lamictal to include potential risk of Stevens-Johnson syndrome. Advised patient to stop taking Lamictal and contact office immediately if rash develops and to seek urgent medical attention if rash is severe and/or spreading quickly.   Diagnoses and all orders for this visit:  Major depressive disorder, recurrent episode, moderate (HCC) -     lamoTRIgine (LAMICTAL) 150 MG tablet; Take 1 tablet (150 mg total) by mouth at bedtime. -     buPROPion (WELLBUTRIN XL) 150 MG 24 hr tablet; Take 1 tablet (150 mg total) by mouth  daily.  Bipolar II disorder (HCC) -      lamoTRIgine (LAMICTAL) 150 MG tablet; Take 1 tablet (150 mg total) by mouth at bedtime.  Generalized anxiety disorder -     buPROPion (WELLBUTRIN XL) 150 MG 24 hr tablet; Take 1 tablet (150 mg total) by mouth daily.  Attention deficit hyperactivity disorder (ADHD), unspecified ADHD type -     amphetamine-dextroamphetamine (ADDERALL XR) 30 MG 24 hr capsule; Take 1 capsule (30 mg total) by mouth daily. -     amphetamine-dextroamphetamine (ADDERALL XR) 30 MG 24 hr capsule; Take 1 capsule (30 mg total) by mouth daily. -     amphetamine-dextroamphetamine (ADDERALL XR) 30 MG 24 hr capsule; Take 1 capsule (30 mg total) by mouth daily.    Please see After Visit Summary for patient specific instructions.  No future appointments.  No orders of the defined types were placed in this encounter.     -------------------------------

## 2019-08-02 NOTE — Telephone Encounter (Signed)
Ms. Alexis [name], Alexis [name] are scheduled for a virtual visit with your provider today.    Just as we do with appointments in the office, we must obtain your consent to participate.  Your consent will be active for this visit and any virtual visit you may have with one of our providers in the next 365 days.    If you have a MyChart account, I can also send a copy of this consent to you electronically.  All virtual visits are billed to your insurance company just like a traditional visit in the office.  As this is a virtual visit, video technology does not allow for your provider to perform a traditional examination.  This may limit your provider's ability to fully assess your condition.  If your provider identifies any concerns that need to be evaluated in person or the need to arrange testing such as labs, EKG, etc, we will make arrangements to do so.    Although advances in technology are sophisticated, we cannot ensure that it will always work on either your end or our end.  If the connection with a video visit is poor, we may have to switch to a telephone visit.  With either a video or telephone visit, we are not always able to ensure that we have a secure connection.   I need to obtain your verbal consent now.   Are you willing to proceed with your visit today?   Alexis [name] has provided verbal consent on 08/02/2019 for a virtual visit (video or telephone).   Dorothyann Gibbs, NP 08/02/2019  2:57 PM

## 2019-10-19 DIAGNOSIS — N3000 Acute cystitis without hematuria: Secondary | ICD-10-CM | POA: Diagnosis not present

## 2019-11-08 ENCOUNTER — Telehealth (INDEPENDENT_AMBULATORY_CARE_PROVIDER_SITE_OTHER): Payer: BC Managed Care – PPO | Admitting: Adult Health

## 2019-11-08 ENCOUNTER — Encounter: Payer: Self-pay | Admitting: Adult Health

## 2019-11-08 DIAGNOSIS — F909 Attention-deficit hyperactivity disorder, unspecified type: Secondary | ICD-10-CM | POA: Diagnosis not present

## 2019-11-08 DIAGNOSIS — F411 Generalized anxiety disorder: Secondary | ICD-10-CM

## 2019-11-08 DIAGNOSIS — F331 Major depressive disorder, recurrent, moderate: Secondary | ICD-10-CM

## 2019-11-08 DIAGNOSIS — F3181 Bipolar II disorder: Secondary | ICD-10-CM

## 2019-11-08 MED ORDER — AMPHETAMINE-DEXTROAMPHET ER 30 MG PO CP24: 30.0000 mg | ORAL_CAPSULE | Freq: Every day | ORAL | 0 refills | Status: DC

## 2019-11-08 NOTE — Progress Notes (Signed)
Alexis [name] [id number] 1990/06/10 29 y.o.  Virtual Visit via Video Note  I connected with pt @ on 11/08/19 at 10:00 AM EDT by a video enabled telemedicine application and verified that I am speaking with the correct person using two identifiers.   I discussed the limitations of evaluation and management by telemedicine and the availability of in person appointments. The patient expressed understanding and agreed to proceed.  I discussed the assessment and treatment plan with the patient. The patient was provided an opportunity to ask questions and all were answered. The patient agreed with the plan and demonstrated an understanding of the instructions.   The patient was advised to call back or seek an in-person evaluation if the symptoms worsen or if the condition fails to improve as anticipated.  I provided 30 minutes of non-face-to-face time during this encounter.  The patient was located at home.  The provider was located at [facility].   Alexis Gibbs, NP   Subjective:   Patient ID:  Alexis [name] is a 29 y.o. (DOB [birth year]) female.  Chief Complaint: No chief complaint on file.   HPI Alexis [name] presents for follow-up of ADHD, GAD, MDD, and BPD 2.   Describes mood today as "ok". Pleasant. Mood symptoms - denies depression, anxiety, and irritability. Stating "I've been doing good". Out of work today with UTI. Has signed up for [employer] to eat healthier Having dinner with family on Sundays. Getting out some. Stable interest and motivation. Taking medications as prescribed.  Energy levels stable. Active, does not have a regular exercise routine. Works full-time.  Enjoys some usual interests and activities. Single. Lives alone. Spending time with family - parents local. Talking with friends.  Appetite adequate. Weight stable - 135 pounds. Sleeps well most nights. Averages 6 to 8 hours. Focus and concentration stable. Completing tasks.  Managing aspects of household. Work going well.  Denies SI or HI. Denies AH or VH.     Review of Systems:  Review of Systems  Musculoskeletal: Negative for gait problem.  Neurological: Negative for tremors.  Psychiatric/Behavioral:       Please refer to HPI    Medications: I have reviewed the patient's current medications.  Current Outpatient Medications  Medication Sig Dispense Refill  . amphetamine-dextroamphetamine (ADDERALL XR) 30 MG 24 hr capsule Take 1 capsule (30 mg total) by mouth daily. 30 capsule 0  . [START ON 12/06/2019] amphetamine-dextroamphetamine (ADDERALL XR) 30 MG 24 hr capsule Take 1 capsule (30 mg total) by mouth daily. 30 capsule 0  . [START ON 01/03/2020] amphetamine-dextroamphetamine (ADDERALL XR) 30 MG 24 hr capsule Take 1 capsule (30 mg total) by mouth daily. 30 capsule 0  . buPROPion (WELLBUTRIN XL) 150 MG 24 hr tablet Take 1 tablet (150 mg total) by mouth daily. 90 tablet 3  . HYPERCARE 20 % external solution Apply externally to skin daily at bedtime 35 mL 0  . lamoTRIgine (LAMICTAL) 150 MG tablet Take 1 tablet (150 mg total) by mouth at bedtime. 90 tablet 3  . Multiple Vitamin (MULTI-VITAMIN PO) Take by mouth.     No current facility-administered medications for this visit.    Medication Side Effects: None  Allergies:  Allergies  Allergen Reactions  . Hornet Venom Anaphylaxis    Past Medical History:  Diagnosis Date  . ACNE VULGARIS 04/28/2007  . Alcohol abuse, in remission     hx of amnesia   . ALCOHOL USE 07/22/2009  . Anxiety   . ATTENTION  DEFICIT DISORDER 04/28/2007    hx of speech therapy  . Bipolar disorder (HCC)   . Depression   . EXERCISE INDUCED ASTHMA 04/28/2007  . History of pyelonephritis 05/2009  . HSV (herpes simplex virus) anogenital infection     Family History  Problem Relation Age of Onset  . Heart attack Mother   . Asthma Mother   . Alcohol abuse Paternal Grandmother   . Depression Paternal Grandmother   . Diabetes  type II Maternal Grandmother   . Dementia Maternal Grandfather   . Stroke Paternal Grandfather     Social History   Socioeconomic History  . Marital status: Single    Spouse name: Not on file  . Number of children: Not on file  . Years of education: Not on file  . Highest education level: Not on file  Occupational History  . Not on file  Tobacco Use  . Smoking status: Current Every Day Smoker    Packs/day: 0.25    Types: Cigarettes  . Smokeless tobacco: Never Used  Vaping Use  . Vaping Use: Never used  Substance and Sexual Activity  . Alcohol use: Yes    Alcohol/week: 14.0 - 20.0 standard drinks    Types: 14 - 20 Glasses of wine per week  . Drug use: No    Comment: In the past- Pot 3 months ago  . Sexual activity: Yes    Birth control/protection: Pill  Other Topics Concern  . Not on file  Social History Narrative   [facility] history  And english  Dropped out temporarily getting psych help  On line courses  In past    No ets    [employer] 4    Volunteered  [employer]   Limited etoh   Stopped tobacco( now 2-3 x per week)   Working [employer] 15- 20 hours per week.   [employer] to finish school degress   Social Determinants of Health   Financial Resource Strain:   . Difficulty of Paying Living Expenses: Not on file  Food Insecurity:   . Worried About Programme researcher, broadcasting/film/video in the Last Year: Not on file  . Ran Out of Food in the Last Year: Not on file  Transportation Needs:   . Lack of Transportation (Medical): Not on file  . Lack of Transportation (Non-Medical): Not on file  Physical Activity:   . Days of Exercise per Week: Not on file  . Minutes of Exercise per Session: Not on file  Stress:   . Feeling of Stress : Not on file  Social Connections:   . Frequency of Communication with Friends and Family: Not on file  . Frequency of Social Gatherings with Friends and Family: Not on file  . Attends Religious Services: Not on file  . Active Member of Clubs or  Organizations: Not on file  . Attends Banker Meetings: Not on file  . Marital Status: Not on file  Intimate Partner Violence:   . Fear of Current or Ex-Partner: Not on file  . Emotionally Abused: Not on file  . Physically Abused: Not on file  . Sexually Abused: Not on file    Past Medical History, Surgical history, Social history, and Family history were reviewed and updated as appropriate.   Please see review of systems for further details on the patient's review from today.   Objective:   Physical Exam:  There were no vitals taken for this visit.  Physical Exam Constitutional:  General: She is not in acute distress. Musculoskeletal:        General: No deformity.  Neurological:     Mental Status: She is alert and oriented to person, place, and time.     Coordination: Coordination normal.  Psychiatric:        Attention and Perception: Attention and perception normal. She does not perceive auditory or visual hallucinations.        Mood and Affect: Mood normal. Mood is not anxious or depressed. Affect is not labile, blunt, angry or inappropriate.        Speech: Speech normal.        Behavior: Behavior normal.        Thought Content: Thought content normal. Thought content is not paranoid or delusional. Thought content does not include homicidal or suicidal ideation. Thought content does not include homicidal or suicidal plan.        Cognition and Memory: Cognition and memory normal.        Judgment: Judgment normal.     Comments: Insight intact     Lab Review:     Component Value Date/Time   NA 143 05/19/2019 2245   K 4.0 05/19/2019 2245   CL 109 05/19/2019 2245   CO2 21 (L) 05/19/2019 2245   GLUCOSE 92 05/19/2019 2245   BUN 10 05/19/2019 2245   CREATININE 0.50 05/19/2019 2245   CALCIUM 9.2 05/19/2019 2245   PROT 8.6 (H) 05/19/2019 2245   ALBUMIN 4.6 05/19/2019 2245   AST 18 05/19/2019 2245   ALT 19 05/19/2019 2245   ALKPHOS 80 05/19/2019 2245    BILITOT 0.4 05/19/2019 2245   GFRNONAA >60 05/19/2019 2245   GFRAA >60 05/19/2019 2245       Component Value Date/Time   WBC 9.8 05/19/2019 2245   RBC 5.37 (H) 05/19/2019 2245   HGB 16.8 (H) 05/19/2019 2245   HCT 49.3 (H) 05/19/2019 2245   PLT 297 05/19/2019 2245   MCV 91.8 05/19/2019 2245   MCV 90.3 08/21/2012 2004   MCH 31.3 05/19/2019 2245   MCHC 34.1 05/19/2019 2245   RDW 11.9 05/19/2019 2245   LYMPHSABS 2.2 05/19/2019 2245   MONOABS 0.5 05/19/2019 2245   EOSABS 0.0 05/19/2019 2245   BASOSABS 0.0 05/19/2019 2245    No results found for: POCLITH, LITHIUM   No results found for: PHENYTOIN, PHENOBARB, VALPROATE, CBMZ   .res Assessment: Plan:    Plan:  Adderall XR 30mg  daily Lamictal 150mg  daily Wellbutrin XL 150mg  daily  WNL  RTC 3 months  Patient advised to contact office with any questions, adverse effects, or acute worsening in signs and symptoms.  Discussed potential benefits, risks, and side effects of stimulants with patient to include increased heart rate, palpitations, insomnia, increased anxiety, increased irritability, or decreased appetite.  Instructed patient to contact office if experiencing any significant tolerability issues.  Counseled patient regarding potential benefits, risks, and side effects of Lamictal to include potential risk of Stevens-Johnson syndrome. Advised patient to stop taking Lamictal and contact office immediately if rash develops and to seek urgent medical attention if rash is severe and/or spreading quickly.   Diagnoses and all orders for this visit:  Attention deficit hyperactivity disorder (ADHD), unspecified ADHD type -     amphetamine-dextroamphetamine (ADDERALL XR) 30 MG 24 hr capsule; Take 1 capsule (30 mg total) by mouth daily. -     amphetamine-dextroamphetamine (ADDERALL XR) 30 MG 24 hr capsule; Take 1 capsule (30 mg total) by mouth daily. -  amphetamine-dextroamphetamine (ADDERALL XR) 30 MG 24 hr capsule; Take 1  capsule (30 mg total) by mouth daily.  Major depressive disorder, recurrent episode, moderate (HCC)  Bipolar II disorder (HCC)  Generalized anxiety disorder     Please see After Visit Summary for patient specific instructions.  No future appointments.  No orders of the defined types were placed in this encounter.     -------------------------------

## 2020-02-20 ENCOUNTER — Telehealth: Payer: Self-pay | Admitting: Adult Health

## 2020-02-20 DIAGNOSIS — F909 Attention-deficit hyperactivity disorder, unspecified type: Secondary | ICD-10-CM

## 2020-02-20 MED ORDER — AMPHETAMINE-DEXTROAMPHET ER 30 MG PO CP24: 30.0000 mg | ORAL_CAPSULE | Freq: Every day | ORAL | 0 refills | Status: DC

## 2020-02-20 NOTE — Telephone Encounter (Signed)
Next appt is 03/05/20. Requesting refill on Adderall 30 mg called to [facility] on [address], [place]. Phone # [phone]

## 2020-02-20 NOTE — Telephone Encounter (Signed)
Script sent  

## 2020-02-26 DIAGNOSIS — Z1152 Encounter for screening for COVID-19: Secondary | ICD-10-CM | POA: Diagnosis not present

## 2020-03-05 ENCOUNTER — Encounter: Payer: Self-pay | Admitting: Adult Health

## 2020-03-05 ENCOUNTER — Telehealth: Payer: BC Managed Care – PPO | Admitting: Adult Health

## 2020-03-05 DIAGNOSIS — F909 Attention-deficit hyperactivity disorder, unspecified type: Secondary | ICD-10-CM

## 2020-03-05 DIAGNOSIS — F3181 Bipolar II disorder: Secondary | ICD-10-CM

## 2020-03-05 DIAGNOSIS — F411 Generalized anxiety disorder: Secondary | ICD-10-CM

## 2020-03-05 DIAGNOSIS — F331 Major depressive disorder, recurrent, moderate: Secondary | ICD-10-CM

## 2020-03-05 MED ORDER — AMPHETAMINE-DEXTROAMPHET ER 30 MG PO CP24: 30.0000 mg | ORAL_CAPSULE | Freq: Every day | ORAL | 0 refills | Status: DC

## 2020-03-05 MED ORDER — BUPROPION HCL ER (XL) 150 MG PO TB24: 150.0000 mg | ORAL_TABLET | Freq: Every day | ORAL | 3 refills | Status: DC

## 2020-03-05 MED ORDER — LAMOTRIGINE 150 MG PO TABS: 150.0000 mg | ORAL_TABLET | Freq: Every day | ORAL | 3 refills | Status: DC

## 2020-03-05 NOTE — Progress Notes (Signed)
No show my chart video. LVM to call and r/s.

## 2020-04-28 NOTE — Telephone Encounter (Signed)
So I am aware of services online where you can have this test done. I do not see  how we would do it through our practice. Because it would not be our test and we would have to charge you.  And not sure certification would be excepted  There are online services that do these visits one is [facility]  And they are AG tests,   FYI if you have a positive test within so many days either 3 or 6 months and you show that documentation: the testing might be waived to go to other countries but perhaps not to get back again.  They are  used to doing this for travel but they send you to test themselves pretty much timely manner. [email or web address]://www.emed.com/

## 2020-06-03 ENCOUNTER — Encounter: Payer: Self-pay | Admitting: Adult Health

## 2020-06-03 ENCOUNTER — Telehealth (INDEPENDENT_AMBULATORY_CARE_PROVIDER_SITE_OTHER): Payer: BC Managed Care – PPO | Admitting: Adult Health

## 2020-06-03 DIAGNOSIS — F411 Generalized anxiety disorder: Secondary | ICD-10-CM

## 2020-06-03 DIAGNOSIS — F3181 Bipolar II disorder: Secondary | ICD-10-CM | POA: Diagnosis not present

## 2020-06-03 DIAGNOSIS — F331 Major depressive disorder, recurrent, moderate: Secondary | ICD-10-CM | POA: Diagnosis not present

## 2020-06-03 DIAGNOSIS — F909 Attention-deficit hyperactivity disorder, unspecified type: Secondary | ICD-10-CM

## 2020-06-03 MED ORDER — AMPHETAMINE-DEXTROAMPHET ER 30 MG PO CP24: 30.0000 mg | ORAL_CAPSULE | Freq: Every day | ORAL | 0 refills | Status: DC

## 2020-06-03 NOTE — Progress Notes (Signed)
Alexis [name] [id number] 1990-10-30 30 y.o.  Virtual Visit via Video Note  I connected with pt @ on 06/03/20 at  2:00 PM EDT by a video enabled telemedicine application and verified that I am speaking with the correct person using two identifiers.   I discussed the limitations of evaluation and management by telemedicine and the availability of in person appointments. The patient expressed understanding and agreed to proceed.  I discussed the assessment and treatment plan with the patient. The patient was provided an opportunity to ask questions and all were answered. The patient agreed with the plan and demonstrated an understanding of the instructions.   The patient was advised to call back or seek an in-person evaluation if the symptoms worsen or if the condition fails to improve as anticipated.  I provided 30 minutes of non-face-to-face time during this encounter.  The patient was located at home.  The provider was located at [facility].   Dorothyann Gibbs, NP   Subjective:   Patient ID:  Alexis [name] is a 30 y.o. (DOB 02/14/1991) female.  Chief Complaint: No chief complaint on file.   HPI Alexis [name] presents for follow-up of ADHD, GAD, MDD, and BPD 2.   Describes mood today as "ok". Pleasant. Mood symptoms - denies depression, anxiety, and irritability. Stating "I'm doing good". Recent trip to [place] with mother. Feels like medication continues to work well. Reports some ongoing adjustments with the Adderall XR - prefers Vyvanse, but not covered by insurance. Stable interest and motivation. Taking medications as prescribed.  Energy levels stable. Active, does not have a regular exercise routine.  Enjoys some usual interests and activities. Single. Lives alone. Spending time with family - parents local. Talking with friends.  Appetite adequate. Weight stable - 135 pounds. Sleeps well most nights. Averages 6 to 8 hours. Focus and concentration  stable. Completing tasks. Managing aspects of household. Work going well. Works full-time.  Denies SI or HI.  Denies AH or VH.    Review of Systems:  Review of Systems  Musculoskeletal: Negative for gait problem.  Neurological: Negative for tremors.  Psychiatric/Behavioral:       Please refer to HPI    Medications: I have reviewed the patient's current medications.  Current Outpatient Medications  Medication Sig Dispense Refill  . amphetamine-dextroamphetamine (ADDERALL XR) 30 MG 24 hr capsule Take 1 capsule (30 mg total) by mouth daily. 30 capsule 0  . [START ON 07/01/2020] amphetamine-dextroamphetamine (ADDERALL XR) 30 MG 24 hr capsule Take 1 capsule (30 mg total) by mouth daily. 30 capsule 0  . [START ON 07/29/2020] amphetamine-dextroamphetamine (ADDERALL XR) 30 MG 24 hr capsule Take 1 capsule (30 mg total) by mouth daily. 30 capsule 0  . buPROPion (WELLBUTRIN XL) 150 MG 24 hr tablet Take 1 tablet (150 mg total) by mouth daily. 90 tablet 3  . HYPERCARE 20 % external solution Apply externally to skin daily at bedtime 35 mL 0  . lamoTRIgine (LAMICTAL) 150 MG tablet Take 1 tablet (150 mg total) by mouth at bedtime. 90 tablet 3  . Multiple Vitamin (MULTI-VITAMIN PO) Take by mouth.     No current facility-administered medications for this visit.    Medication Side Effects: None  Allergies:  Allergies  Allergen Reactions  . Hornet Venom Anaphylaxis    Past Medical History:  Diagnosis Date  . ACNE VULGARIS 04/28/2007  . Alcohol abuse, in remission     hx of amnesia   . ALCOHOL USE 07/22/2009  .  Anxiety   . ATTENTION DEFICIT DISORDER 04/28/2007    hx of speech therapy  . Bipolar disorder (HCC)   . Depression   . EXERCISE INDUCED ASTHMA 04/28/2007  . History of pyelonephritis 05/2009  . HSV (herpes simplex virus) anogenital infection     Family History  Problem Relation Age of Onset  . Heart attack Mother   . Asthma Mother   . Alcohol abuse Paternal Grandmother   .  Depression Paternal Grandmother   . Diabetes type II Maternal Grandmother   . Dementia Maternal Grandfather   . Stroke Paternal Grandfather     Social History   Socioeconomic History  . Marital status: Single    Spouse name: Not on file  . Number of children: Not on file  . Years of education: Not on file  . Highest education level: Not on file  Occupational History  . Not on file  Tobacco Use  . Smoking status: Current Every Day Smoker    Packs/day: 0.25    Types: Cigarettes  . Smokeless tobacco: Never Used  Vaping Use  . Vaping Use: Never used  Substance and Sexual Activity  . Alcohol use: Yes    Alcohol/week: 14.0 - 20.0 standard drinks    Types: 14 - 20 Glasses of wine per week  . Drug use: No    Comment: In the past- Pot 3 months ago  . Sexual activity: Yes    Birth control/protection: Pill  Other Topics Concern  . Not on file  Social History Narrative   [facility] history  And english  Dropped out temporarily getting psych help  On line courses  In past    No ets    [employer]    Volunteered  [employer]   Limited etoh   Stopped tobacco( now 2-3 x per week)   Working [employer] 15- 20 hours per week.   [employer] to finish school degress   Social Determinants of Corporate investment banker Strain: Not on file  Food Insecurity: Not on file  Transportation Needs: Not on file  Physical Activity: Not on file  Stress: Not on file  Social Connections: Not on file  Intimate Partner Violence: Not on file    Past Medical History, Surgical history, Social history, and Family history were reviewed and updated as appropriate.   Please see review of systems for further details on the patient's review from today.   Objective:   Physical Exam:  There were no vitals taken for this visit.  Physical Exam Constitutional:      General: She is not in acute distress. Musculoskeletal:        General: No deformity.  Neurological:     Mental Status: She is alert and  oriented to person, place, and time.     Coordination: Coordination normal.  Psychiatric:        Attention and Perception: Attention and perception normal. She does not perceive auditory or visual hallucinations.        Mood and Affect: Mood normal. Mood is not anxious or depressed. Affect is not labile, blunt, angry or inappropriate.        Speech: Speech normal.        Behavior: Behavior normal.        Thought Content: Thought content normal. Thought content is not paranoid or delusional. Thought content does not include homicidal or suicidal ideation. Thought content does not include homicidal or suicidal plan.        Cognition  and Memory: Cognition and memory normal.        Judgment: Judgment normal.     Comments: Insight intact     Lab Review:     Component Value Date/Time   NA 143 05/19/2019 2245   K 4.0 05/19/2019 2245   CL 109 05/19/2019 2245   CO2 21 (L) 05/19/2019 2245   GLUCOSE 92 05/19/2019 2245   BUN 10 05/19/2019 2245   CREATININE 0.50 05/19/2019 2245   CALCIUM 9.2 05/19/2019 2245   PROT 8.6 (H) 05/19/2019 2245   ALBUMIN 4.6 05/19/2019 2245   AST 18 05/19/2019 2245   ALT 19 05/19/2019 2245   ALKPHOS 80 05/19/2019 2245   BILITOT 0.4 05/19/2019 2245   GFRNONAA >60 05/19/2019 2245   GFRAA >60 05/19/2019 2245       Component Value Date/Time   WBC 9.8 05/19/2019 2245   RBC 5.37 (H) 05/19/2019 2245   HGB 16.8 (H) 05/19/2019 2245   HCT 49.3 (H) 05/19/2019 2245   PLT 297 05/19/2019 2245   MCV 91.8 05/19/2019 2245   MCV 90.3 08/21/2012 2004   MCH 31.3 05/19/2019 2245   MCHC 34.1 05/19/2019 2245   RDW 11.9 05/19/2019 2245   LYMPHSABS 2.2 05/19/2019 2245   MONOABS 0.5 05/19/2019 2245   EOSABS 0.0 05/19/2019 2245   BASOSABS 0.0 05/19/2019 2245    No results found for: POCLITH, LITHIUM   No results found for: PHENYTOIN, PHENOBARB, VALPROATE, CBMZ   .res Assessment: Plan:     Plan:  Adderall XR 30mg  daily Lamictal 150mg  daily Wellbutrin XL 150mg   daily  BP WNL  RTC 3 months  Patient advised to contact office with any questions, adverse effects, or acute worsening in signs and symptoms.  Discussed potential benefits, risks, and side effects of stimulants with patient to include increased heart rate, palpitations, insomnia, increased anxiety, increased irritability, or decreased appetite.  Instructed patient to contact office if experiencing any significant tolerability issues.  Counseled patient regarding potential benefits, risks, and side effects of Lamictal to include potential risk of Stevens-Johnson syndrome. Advised patient to stop taking Lamictal and contact office immediately if rash develops and to seek urgent medical attention if rash is severe and/or spreading quickly.    Diagnoses and all orders for this visit:  Major depressive disorder, recurrent episode, moderate (HCC)  Attention deficit hyperactivity disorder (ADHD), unspecified ADHD type -     amphetamine-dextroamphetamine (ADDERALL XR) 30 MG 24 hr capsule; Take 1 capsule (30 mg total) by mouth daily. -     amphetamine-dextroamphetamine (ADDERALL XR) 30 MG 24 hr capsule; Take 1 capsule (30 mg total) by mouth daily. -     amphetamine-dextroamphetamine (ADDERALL XR) 30 MG 24 hr capsule; Take 1 capsule (30 mg total) by mouth daily.  Bipolar II disorder (HCC)  Generalized anxiety disorder     Please see After Visit Summary for patient specific instructions.  No future appointments.  No orders of the defined types were placed in this encounter.     -------------------------------

## 2020-09-02 ENCOUNTER — Other Ambulatory Visit: Payer: Self-pay

## 2020-09-02 ENCOUNTER — Telehealth: Payer: Self-pay | Admitting: Adult Health

## 2020-09-02 DIAGNOSIS — F909 Attention-deficit hyperactivity disorder, unspecified type: Secondary | ICD-10-CM

## 2020-09-02 MED ORDER — AMPHETAMINE-DEXTROAMPHET ER 30 MG PO CP24: 30.0000 mg | ORAL_CAPSULE | Freq: Every day | ORAL | 0 refills | Status: DC

## 2020-09-02 NOTE — Telephone Encounter (Signed)
Pended.

## 2020-09-02 NOTE — Telephone Encounter (Signed)
Next visit is 09/11/20. Requesting refill for Adderall called to:  [id number] - [place], [place] - [address]  Phone:  [phone]  Fax:  [phone]

## 2020-09-11 ENCOUNTER — Ambulatory Visit (INDEPENDENT_AMBULATORY_CARE_PROVIDER_SITE_OTHER): Payer: BC Managed Care – PPO | Admitting: Adult Health

## 2020-09-11 ENCOUNTER — Encounter: Payer: Self-pay | Admitting: Adult Health

## 2020-09-11 ENCOUNTER — Other Ambulatory Visit: Payer: Self-pay

## 2020-09-11 DIAGNOSIS — F3181 Bipolar II disorder: Secondary | ICD-10-CM | POA: Diagnosis not present

## 2020-09-11 DIAGNOSIS — F331 Major depressive disorder, recurrent, moderate: Secondary | ICD-10-CM

## 2020-09-11 DIAGNOSIS — F411 Generalized anxiety disorder: Secondary | ICD-10-CM | POA: Diagnosis not present

## 2020-09-11 DIAGNOSIS — F909 Attention-deficit hyperactivity disorder, unspecified type: Secondary | ICD-10-CM

## 2020-09-11 MED ORDER — AMPHETAMINE-DEXTROAMPHET ER 30 MG PO CP24: 30.0000 mg | ORAL_CAPSULE | Freq: Every day | ORAL | 0 refills | Status: DC

## 2020-09-11 MED ORDER — BUPROPION HCL ER (XL) 150 MG PO TB24: 150.0000 mg | ORAL_TABLET | Freq: Every day | ORAL | 3 refills | Status: DC

## 2020-09-11 MED ORDER — LAMOTRIGINE 150 MG PO TABS: 150.0000 mg | ORAL_TABLET | Freq: Every day | ORAL | 3 refills | Status: DC

## 2020-09-11 NOTE — Progress Notes (Signed)
Alexis [name] [id number] 12-07-1990 30 y.o.  Subjective:   Patient ID:  Alexis [name] is a 30 y.o. (DOB May 23, 1990) female.  Chief Complaint: No chief complaint on file.   HPI Alexis [name] presents to the office today for follow-up of ADHD, GAD, MDD, and BPD 2.   Describes mood today as "ok". Pleasant. Mood symptoms - denies depression, anxiety, and irritability. Stating "I'm doing good". Feels like medication continues to work well. Planning a trip to [place] next year. Stable interest and motivation. Taking medications as prescribed.  Energy levels stable. Active, has a regular exercise routine.  Enjoys some usual interests and activities. Single. Lives alone. Spending time with family - parents local. Talking with friends.  Appetite adequate. Weight loss - 125 from 135 pounds. Eating healthier. Sleeps well most nights. Averages 6 to 8 hours. Focus and concentration stable. Completing tasks. Managing aspects of household. Work going well. Pet sitting. Denies SI or HI.  Denies AH or VH.    Review of Systems:  Review of Systems  Musculoskeletal:  Negative for gait problem.  Neurological:  Negative for tremors.  Psychiatric/Behavioral:         Please refer to HPI   Medications: I have reviewed the patient's current medications.  Current Outpatient Medications  Medication Sig Dispense Refill   amphetamine-dextroamphetamine (ADDERALL XR) 30 MG 24 hr capsule Take 1 capsule (30 mg total) by mouth daily. 30 capsule 0   [START ON 10/09/2020] amphetamine-dextroamphetamine (ADDERALL XR) 30 MG 24 hr capsule Take 1 capsule (30 mg total) by mouth daily. 30 capsule 0   [START ON 11/06/2020] amphetamine-dextroamphetamine (ADDERALL XR) 30 MG 24 hr capsule Take 1 capsule (30 mg total) by mouth daily. 30 capsule 0   buPROPion (WELLBUTRIN XL) 150 MG 24 hr tablet Take 1 tablet (150 mg total) by mouth daily. 90 tablet 3   HYPERCARE 20 % external solution Apply externally to skin  daily at bedtime 35 mL 0   lamoTRIgine (LAMICTAL) 150 MG tablet Take 1 tablet (150 mg total) by mouth at bedtime. 90 tablet 3   Multiple Vitamin (MULTI-VITAMIN PO) Take by mouth.     No current facility-administered medications for this visit.    Medication Side Effects: None  Allergies:  Allergies  Allergen Reactions   Hornet Venom Anaphylaxis    Past Medical History:  Diagnosis Date   ACNE VULGARIS 04/28/2007   Alcohol abuse, in remission     hx of amnesia    ALCOHOL USE 07/22/2009   Anxiety    ATTENTION DEFICIT DISORDER 04/28/2007    hx of speech therapy   Bipolar disorder (HCC)    Depression    EXERCISE INDUCED ASTHMA 04/28/2007   History of pyelonephritis 05/2009   HSV (herpes simplex virus) anogenital infection     Past Medical History, Surgical history, Social history, and Family history were reviewed and updated as appropriate.   Please see review of systems for further details on the patient's review from today.   Objective:   Physical Exam:  There were no vitals taken for this visit.  Physical Exam Constitutional:      General: She is not in acute distress. Musculoskeletal:        General: No deformity.  Neurological:     Mental Status: She is alert and oriented to person, place, and time.     Coordination: Coordination normal.  Psychiatric:        Attention and Perception: Attention and perception normal. She does not perceive  auditory or visual hallucinations.        Mood and Affect: Mood normal. Mood is not anxious or depressed. Affect is not labile, blunt, angry or inappropriate.        Speech: Speech normal.        Behavior: Behavior normal.        Thought Content: Thought content normal. Thought content is not paranoid or delusional. Thought content does not include homicidal or suicidal ideation. Thought content does not include homicidal or suicidal plan.        Cognition and Memory: Cognition and memory normal.        Judgment: Judgment normal.      Comments: Insight intact    Lab Review:     Component Value Date/Time   NA 143 05/19/2019 2245   K 4.0 05/19/2019 2245   CL 109 05/19/2019 2245   CO2 21 (L) 05/19/2019 2245   GLUCOSE 92 05/19/2019 2245   BUN 10 05/19/2019 2245   CREATININE 0.50 05/19/2019 2245   CALCIUM 9.2 05/19/2019 2245   PROT 8.6 (H) 05/19/2019 2245   ALBUMIN 4.6 05/19/2019 2245   AST 18 05/19/2019 2245   ALT 19 05/19/2019 2245   ALKPHOS 80 05/19/2019 2245   BILITOT 0.4 05/19/2019 2245   GFRNONAA >60 05/19/2019 2245   GFRAA >60 05/19/2019 2245       Component Value Date/Time   WBC 9.8 05/19/2019 2245   RBC 5.37 (H) 05/19/2019 2245   HGB 16.8 (H) 05/19/2019 2245   HCT 49.3 (H) 05/19/2019 2245   PLT 297 05/19/2019 2245   MCV 91.8 05/19/2019 2245   MCV 90.3 08/21/2012 2004   MCH 31.3 05/19/2019 2245   MCHC 34.1 05/19/2019 2245   RDW 11.9 05/19/2019 2245   LYMPHSABS 2.2 05/19/2019 2245   MONOABS 0.5 05/19/2019 2245   EOSABS 0.0 05/19/2019 2245   BASOSABS 0.0 05/19/2019 2245    No results found for: POCLITH, LITHIUM   No results found for: PHENYTOIN, PHENOBARB, VALPROATE, CBMZ   .res Assessment: Plan:    Plan:  Adderall XR 30mg  daily Lamictal 150mg  daily Wellbutrin XL 150mg  daily  BP WNL  RTC 3 months  Patient advised to contact office with any questions, adverse effects, or acute worsening in signs and symptoms.  Discussed potential benefits, risks, and side effects of stimulants with patient to include increased heart rate, palpitations, insomnia, increased anxiety, increased irritability, or decreased appetite.  Instructed patient to contact office if experiencing any significant tolerability issues.  Counseled patient regarding potential benefits, risks, and side effects of Lamictal to include potential risk of Stevens-Johnson syndrome. Advised patient to stop taking Lamictal and contact office immediately if rash develops and to seek urgent medical attention if rash is severe and/or  spreading quickly.  Diagnoses and all orders for this visit:  Attention deficit hyperactivity disorder (ADHD), unspecified ADHD type -     amphetamine-dextroamphetamine (ADDERALL XR) 30 MG 24 hr capsule; Take 1 capsule (30 mg total) by mouth daily. -     amphetamine-dextroamphetamine (ADDERALL XR) 30 MG 24 hr capsule; Take 1 capsule (30 mg total) by mouth daily. -     amphetamine-dextroamphetamine (ADDERALL XR) 30 MG 24 hr capsule; Take 1 capsule (30 mg total) by mouth daily.  Major depressive disorder, recurrent episode, moderate (HCC) -     lamoTRIgine (LAMICTAL) 150 MG tablet; Take 1 tablet (150 mg total) by mouth at bedtime. -     buPROPion (WELLBUTRIN XL) 150 MG 24 hr tablet; Take 1  tablet (150 mg total) by mouth daily.  Bipolar II disorder (HCC) -     lamoTRIgine (LAMICTAL) 150 MG tablet; Take 1 tablet (150 mg total) by mouth at bedtime.  Generalized anxiety disorder -     buPROPion (WELLBUTRIN XL) 150 MG 24 hr tablet; Take 1 tablet (150 mg total) by mouth daily.    Please see After Visit Summary for patient specific instructions.  Future Appointments  Date Time Provider Department Center  12/12/2020  8:00 AM Amandalee Lacap, Thereasa Solo, NP CP-CP None    No orders of the defined types were placed in this encounter.   -------------------------------

## 2020-11-06 ENCOUNTER — Telehealth (INDEPENDENT_AMBULATORY_CARE_PROVIDER_SITE_OTHER): Payer: BC Managed Care – PPO | Admitting: Family Medicine

## 2020-11-06 ENCOUNTER — Encounter: Payer: Self-pay | Admitting: Family Medicine

## 2020-11-06 DIAGNOSIS — R059 Cough, unspecified: Secondary | ICD-10-CM

## 2020-11-06 DIAGNOSIS — R0981 Nasal congestion: Secondary | ICD-10-CM

## 2020-11-06 MED ORDER — BENZONATATE 100 MG PO CAPS: 100.0000 mg | ORAL_CAPSULE | Freq: Three times a day (TID) | ORAL | 0 refills | Status: DC | PRN

## 2020-11-06 NOTE — Patient Instructions (Addendum)
   ---------------------------------------------------------------------------------------------------------------------------      WORK SLIP:  Patient Alexis [name],  Apr 29, 1990, was seen for a medical visit today, 11/06/20 . Please excuse from work for a COVID like illness. We advise 5 days minimum from the onset of symptoms (11/04/20) PLUS 1 day of no fever and improved symptoms. If covid testing is positive advise an additional 5 days of mask wearing (N95 or equivalent) if around others after the first 5 days of home isolation.  Sincerely: E-signature: Dr. Kriste Basque, DO [facility] [place] Ph: [phone]   ------------------------------------------------------------------------------------------------------------------------------   HOME CARE TIPS:  -advise repeat covid testing 24-48 hours after initial negative test   -I sent the medication(s) we discussed to your pharmacy: Meds ordered this encounter  Medications   benzonatate (TESSALON PERLES) 100 MG capsule    Sig: Take 1 capsule (100 mg total) by mouth 3 (three) times daily as needed.    Dispense:  20 capsule    Refill:  0    -can use tylenol or aleve if needed for fevers, aches and pains per instructions  -can use nasal saline a few times per day if you have nasal congestion; sometimes  a short course of Afrin nasal spray for 3 days can help with symptoms as well  -stay hydrated, drink plenty of fluids and eat small healthy meals - avoid dairy  -can take 1000 IU ( ) Vit D3 and 100-500 mg of Vit C daily per instructions  -If the Covid test is positive, check out the [facility] website for more information on home care, transmission and treatment for COVID19  -follow up with your doctor in 2-3 days unless improving and feeling better  -stay home while sick, except to seek medical care. If you have COVID19, ideally it would be best to stay home for a full 10 days since the onset of symptoms PLUS  one day of no fever and feeling better. Wear a good mask that fits snugly (such as N95 or KN95) if around others to reduce the risk of transmission.  It was nice to meet you today, and I really hope you are feeling better soon. I help [facility] out with telemedicine visits on Tuesdays and Thursdays and am available for visits on those days. If you have any concerns or questions following this visit please schedule a follow up visit with your Primary Care doctor or seek care at a local urgent care clinic to avoid delays in care.    Seek in person care or schedule a follow up video visit promptly if your symptoms worsen, new concerns arise or you are not improving with treatment. Call 911 and/or seek emergency care if your symptoms are severe or life threatening.

## 2020-11-06 NOTE — Progress Notes (Signed)
Virtual Visit via Video Note  I connected with Alexis [name]  on 11/06/20 at  4:40 PM EDT by a video enabled telemedicine application and verified that I am speaking with the correct person using two identifiers.  Location patient: home, [place] Location provider:work or home office Persons participating in the virtual visit: patient, provider  I discussed the limitations of evaluation and management by telemedicine and the availability of in person appointments. The patient expressed understanding and agreed to proceed.   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  Past Medical History:  Diagnosis Date   ACNE VULGARIS 04/28/2007   Alcohol abuse, in remission     hx of amnesia    ALCOHOL USE 07/22/2009   Anxiety    ATTENTION DEFICIT DISORDER 04/28/2007    hx of speech therapy   Bipolar disorder (HCC)    Depression    EXERCISE INDUCED ASTHMA 04/28/2007   History of pyelonephritis 05/2009   HSV (herpes simplex virus) anogenital infection     History reviewed. No pertinent surgical history.   Current Outpatient Medications:    amphetamine-dextroamphetamine (ADDERALL XR) 30 MG 24 hr capsule, Take 1 capsule (30 mg total) by mouth daily., Disp: 30 capsule, Rfl: 0   amphetamine-dextroamphetamine (ADDERALL XR) 30 MG 24 hr capsule, Take 1 capsule (30 mg total) by mouth daily., Disp: 30 capsule, Rfl: 0   amphetamine-dextroamphetamine (ADDERALL XR) 30 MG 24  hr capsule, Take 1 capsule (30 mg total) by mouth daily., Disp: 30 capsule, Rfl: 0   benzonatate (TESSALON PERLES) 100 MG capsule, Take 1 capsule (100 mg total) by mouth 3 (three) times daily as needed., Disp: 20 capsule, Rfl: 0   buPROPion (WELLBUTRIN XL) 150 MG 24 hr tablet, Take 1 tablet (150 mg total) by mouth daily., Disp: 90 tablet, Rfl: 3   HYPERCARE 20 % external solution, Apply externally to skin daily at bedtime, Disp: 35 mL, Rfl: 0   lamoTRIgine (LAMICTAL) 150 MG tablet, Take 1 tablet (150 mg total) by mouth at bedtime., Disp: 90 tablet, Rfl: 3   levonorgestrel (MIRENA) 20 MCG/DAY IUD, 1 each by Intrauterine route once., Disp: , Rfl:    Multiple Vitamin (MULTI-VITAMIN PO), Take by mouth., Disp: , Rfl:   EXAM:  VITALS per patient if applicable:  GENERAL: alert, oriented, appears well and in no acute distress  HEENT: atraumatic, conjunttiva clear, no obvious abnormalities on inspection of external nose and ears  NECK: normal movements of the head and neck  LUNGS: on inspection no signs of respiratory distress, breathing rate appears normal, no obvious gross SOB, gasping or wheezing  CV: no obvious cyanosis  MS: moves all visible extremities without noticeable abnormality  PSYCH/NEURO: pleasant and cooperative, no obvious depression or anxiety, speech and thought processing grossly intact  ASSESSMENT AND PLAN:  Discussed the following assessment and plan:  Flu-like symptoms  Nasal congestion  Cough  -we discussed possible serious and likely etiologies, options for evaluation and workup, limitations of telemedicine visit vs in person visit, treatment, treatment  risks and precautions. Pt is agreeable to treatment via telemedicine at this moment. Query covid 19, VURI influenza vs other. She has opted for tessalon for cough and other symptomatic care measures per patient instructions. Advised repeat covid testing in 24-48 hours.  Work/School slipped offered: provided in  patient instructions   Advised to seek prompt in person care if worsening, new symptoms arise, or if is not improving with treatment. Discussed options for inperson care if PCP office not available. Did let this patient know that I only do telemedicine on Tuesdays and Thursdays for [place]. Advised to schedule follow up visit with PCP or [facility] if any further questions or concerns to avoid delays in care.   I discussed the assessment and treatment plan with the patient. The patient was provided an opportunity to ask questions and all were answered. The patient agreed with the plan and demonstrated an understanding of the instructions.     Terressa Koyanagi, DO

## 2020-11-09 DIAGNOSIS — Z20822 Contact with and (suspected) exposure to covid-19: Secondary | ICD-10-CM | POA: Diagnosis not present

## 2020-12-12 ENCOUNTER — Encounter: Payer: Self-pay | Admitting: Adult Health

## 2020-12-12 ENCOUNTER — Telehealth (INDEPENDENT_AMBULATORY_CARE_PROVIDER_SITE_OTHER): Payer: BC Managed Care – PPO | Admitting: Adult Health

## 2020-12-12 DIAGNOSIS — F3181 Bipolar II disorder: Secondary | ICD-10-CM

## 2020-12-12 DIAGNOSIS — F331 Major depressive disorder, recurrent, moderate: Secondary | ICD-10-CM

## 2020-12-12 DIAGNOSIS — F411 Generalized anxiety disorder: Secondary | ICD-10-CM | POA: Diagnosis not present

## 2020-12-12 DIAGNOSIS — F909 Attention-deficit hyperactivity disorder, unspecified type: Secondary | ICD-10-CM

## 2020-12-12 MED ORDER — AMPHETAMINE-DEXTROAMPHET ER 30 MG PO CP24: 30.0000 mg | ORAL_CAPSULE | Freq: Every day | ORAL | 0 refills | Status: DC

## 2020-12-12 NOTE — Progress Notes (Signed)
Alexis [name] [id number] 1990-11-05 30 y.o.  Virtual Visit via Video Note  I connected with pt @ on 12/12/20 at  8:00 AM EDT by a video enabled telemedicine application and verified that I am speaking with the correct person using two identifiers.   I discussed the limitations of evaluation and management by telemedicine and the availability of in person appointments. The patient expressed understanding and agreed to proceed.  I discussed the assessment and treatment plan with the patient. The patient was provided an opportunity to ask questions and all were answered. The patient agreed with the plan and demonstrated an understanding of the instructions.   The patient was advised to call back or seek an in-person evaluation if the symptoms worsen or if the condition fails to improve as anticipated.  I provided 25 minutes of non-face-to-face time during this encounter.  The patient was located at home.  The provider was located at [facility].   Dorothyann Gibbs, NP   Subjective:   Patient ID:  Alexis [name] is a 30 y.o. (DOB 10-15-1990) female.  Chief Complaint: No chief complaint on file.   HPI SHANTIA SANFORD presents for follow-up of ADHD, GAD, MDD, and BPD 2.   Describes mood today as "ok". Pleasant. Mood symptoms - denies depression, anxiety, and irritability. Stating "I'm doing alright". Reports having Covid a second time. Increased stressors in the work setting - has moved to a different department. Booking a trip to [place] - 10/2021. Feels like medication continues to work well. Stable interest and motivation. Taking medications as prescribed.  Energy levels stable. Active, has a regular exercise routine.  Enjoys some usual interests and activities. Single. Lives alone. Spending time with family - parents local. Talking with friends.  Appetite adequate. Weight loss - 125 from 135 pounds. Eating healthier. Sleeps well most nights. Averages 6 to 8  hours. Focus and concentration stable. Completing tasks. Managing aspects of household. Work going well. Pet sitting - "going well". Denies SI or HI.  Denies AH or VH.   Review of Systems:  Review of Systems  Musculoskeletal:  Negative for gait problem.  Neurological:  Negative for tremors.  Psychiatric/Behavioral:         Please refer to HPI   Medications: I have reviewed the patient's current medications.  Current Outpatient Medications  Medication Sig Dispense Refill   amphetamine-dextroamphetamine (ADDERALL XR) 30 MG 24 hr capsule Take 1 capsule (30 mg total) by mouth daily. 30 capsule 0   amphetamine-dextroamphetamine (ADDERALL XR) 30 MG 24 hr capsule Take 1 capsule (30 mg total) by mouth daily. 30 capsule 0   amphetamine-dextroamphetamine (ADDERALL XR) 30 MG 24 hr capsule Take 1 capsule (30 mg total) by mouth daily. 30 capsule 0   benzonatate (TESSALON PERLES) 100 MG capsule Take 1 capsule (100 mg total) by mouth 3 (three) times daily as needed. 20 capsule 0   buPROPion (WELLBUTRIN XL) 150 MG 24 hr tablet Take 1 tablet (150 mg total) by mouth daily. 90 tablet 3   HYPERCARE 20 % external solution Apply externally to skin daily at bedtime 35 mL 0   lamoTRIgine (LAMICTAL) 150 MG tablet Take 1 tablet (150 mg total) by mouth at bedtime. 90 tablet 3   levonorgestrel (MIRENA) 20 MCG/DAY IUD 1 each by Intrauterine route once.     Multiple Vitamin (MULTI-VITAMIN PO) Take by mouth.     No current facility-administered medications for this visit.    Medication Side Effects: None  Allergies:  Allergies  Allergen Reactions   Hornet Venom Anaphylaxis    Past Medical History:  Diagnosis Date   ACNE VULGARIS 04/28/2007   Alcohol abuse, in remission     hx of amnesia    ALCOHOL USE 07/22/2009   Anxiety    ATTENTION DEFICIT DISORDER 04/28/2007    hx of speech therapy   Bipolar disorder (HCC)    Depression    EXERCISE INDUCED ASTHMA 04/28/2007   History of pyelonephritis 05/2009    HSV (herpes simplex virus) anogenital infection     Family History  Problem Relation Age of Onset   Heart attack Mother    Asthma Mother    Alcohol abuse Paternal Grandmother    Depression Paternal Grandmother    Diabetes type II Maternal Grandmother    Dementia Maternal Grandfather    Stroke Paternal Grandfather     Social History   Socioeconomic History   Marital status: Single    Spouse name: Not on file   Number of children: Not on file   Years of education: Not on file   Highest education level: Not on file  Occupational History   Not on file  Tobacco Use   Smoking status: Every Day    Packs/day: 0.25    Types: Cigarettes   Smokeless tobacco: Never  Vaping Use   Vaping Use: Never used  Substance and Sexual Activity   Alcohol use: Yes    Alcohol/week: 14.0 - 20.0 standard drinks    Types: 14 - 20 Glasses of wine per week   Drug use: No    Comment: In the past- Pot 3 months ago   Sexual activity: Yes    Birth control/protection: Pill  Other Topics Concern   Not on file  Social History Narrative   [facility] history  And english  Dropped out temporarily getting psych help  On line courses  In past    No ets    [employer]    Volunteered  [employer]   Limited etoh   Stopped tobacco( now 2-3 x per week)   Working [employer] 15- 20 hours per week.   [employer] to finish school degress   Social Determinants of Corporate investment banker Strain: Not on file  Food Insecurity: Not on file  Transportation Needs: Not on file  Physical Activity: Not on file  Stress: Not on file  Social Connections: Not on file  Intimate Partner Violence: Not on file    Past Medical History, Surgical history, Social history, and Family history were reviewed and updated as appropriate.   Please see review of systems for further details on the patient's review from today.   Objective:   Physical Exam:  There were no vitals taken for this visit.  Physical  Exam Constitutional:      General: She is not in acute distress. Musculoskeletal:        General: No deformity.  Neurological:     Mental Status: She is alert and oriented to person, place, and time.     Coordination: Coordination normal.  Psychiatric:        Attention and Perception: Attention and perception normal. She does not perceive auditory or visual hallucinations.        Mood and Affect: Mood normal. Mood is not anxious or depressed. Affect is not labile, blunt, angry or inappropriate.        Speech: Speech normal.        Behavior: Behavior normal.  Thought Content: Thought content normal. Thought content is not paranoid or delusional. Thought content does not include homicidal or suicidal ideation. Thought content does not include homicidal or suicidal plan.        Cognition and Memory: Cognition and memory normal.        Judgment: Judgment normal.     Comments: Insight intact    Lab Review:     Component Value Date/Time   NA 143 05/19/2019 2245   K 4.0 05/19/2019 2245   CL 109 05/19/2019 2245   CO2 21 (L) 05/19/2019 2245   GLUCOSE 92 05/19/2019 2245   BUN 10 05/19/2019 2245   CREATININE 0.50 05/19/2019 2245   CALCIUM 9.2 05/19/2019 2245   PROT 8.6 (H) 05/19/2019 2245   ALBUMIN 4.6 05/19/2019 2245   AST 18 05/19/2019 2245   ALT 19 05/19/2019 2245   ALKPHOS 80 05/19/2019 2245   BILITOT 0.4 05/19/2019 2245   GFRNONAA >60 05/19/2019 2245   GFRAA >60 05/19/2019 2245       Component Value Date/Time   WBC 9.8 05/19/2019 2245   RBC 5.37 (H) 05/19/2019 2245   HGB 16.8 (H) 05/19/2019 2245   HCT 49.3 (H) 05/19/2019 2245   PLT 297 05/19/2019 2245   MCV 91.8 05/19/2019 2245   MCV 90.3 08/21/2012 2004   MCH 31.3 05/19/2019 2245   MCHC 34.1 05/19/2019 2245   RDW 11.9 05/19/2019 2245   LYMPHSABS 2.2 05/19/2019 2245   MONOABS 0.5 05/19/2019 2245   EOSABS 0.0 05/19/2019 2245   BASOSABS 0.0 05/19/2019 2245    No results found for: POCLITH, LITHIUM   No  results found for: PHENYTOIN, PHENOBARB, VALPROATE, CBMZ   .res Assessment: Plan:     Plan:  Adderall XR 30mg  daily Lamictal 150mg  daily Wellbutrin XL 150mg  daily  BP WNL  RTC 3 months  Patient advised to contact office with any questions, adverse effects, or acute worsening in signs and symptoms.  Discussed potential benefits, risks, and side effects of stimulants with patient to include increased heart rate, palpitations, insomnia, increased anxiety, increased irritability, or decreased appetite.  Instructed patient to contact office if experiencing any significant tolerability issues.  Counseled patient regarding potential benefits, risks, and side effects of Lamictal to include potential risk of Stevens-Johnson syndrome. Advised patient to stop taking Lamictal and contact office immediately if rash develops and to seek urgent medical attention if rash is severe and/or spreading quickly.  There are no diagnoses linked to this encounter.   Please see After Visit Summary for patient specific instructions.  Future Appointments  Date Time Provider Department Center  12/12/2020  8:00 AM Maeson Lourenco, , NP [facility] None    No orders of the defined types were placed in this encounter.     -------------------------------

## 2020-12-18 DIAGNOSIS — Z23 Encounter for immunization: Secondary | ICD-10-CM | POA: Diagnosis not present

## 2020-12-18 DIAGNOSIS — Z713 Dietary counseling and surveillance: Secondary | ICD-10-CM | POA: Diagnosis not present

## 2021-03-13 ENCOUNTER — Other Ambulatory Visit: Payer: Self-pay

## 2021-03-13 ENCOUNTER — Other Ambulatory Visit (HOSPITAL_COMMUNITY): Payer: Self-pay

## 2021-03-13 ENCOUNTER — Telehealth: Payer: Self-pay | Admitting: Adult Health

## 2021-03-13 DIAGNOSIS — F909 Attention-deficit hyperactivity disorder, unspecified type: Secondary | ICD-10-CM

## 2021-03-13 MED ORDER — AMPHETAMINE-DEXTROAMPHET ER 30 MG PO CP24
30.0000 mg | ORAL_CAPSULE | Freq: Every day | ORAL | 0 refills | Status: DC
  Filled 2021-03-13 (×2): qty 30, 30d supply, fill #0

## 2021-03-13 NOTE — Telephone Encounter (Signed)
Pt has an appt on 03/16/21. She needs a refill on her adderall xr [facility] [place] on [address]

## 2021-03-13 NOTE — Telephone Encounter (Signed)
Called patient to see if she had checked availability and she had not. She will call pharmacy and call us back.

## 2021-03-13 NOTE — Telephone Encounter (Signed)
Pended to [facility] pharmacy. Canceled Rx at [facility] [place].

## 2021-03-13 NOTE — Telephone Encounter (Signed)
[facility]

## 2021-03-16 ENCOUNTER — Ambulatory Visit (INDEPENDENT_AMBULATORY_CARE_PROVIDER_SITE_OTHER): Payer: BC Managed Care – PPO | Admitting: Adult Health

## 2021-03-16 ENCOUNTER — Other Ambulatory Visit: Payer: Self-pay

## 2021-03-16 ENCOUNTER — Other Ambulatory Visit (HOSPITAL_COMMUNITY): Payer: Self-pay

## 2021-03-16 ENCOUNTER — Encounter: Payer: Self-pay | Admitting: Adult Health

## 2021-03-16 DIAGNOSIS — F411 Generalized anxiety disorder: Secondary | ICD-10-CM | POA: Diagnosis not present

## 2021-03-16 DIAGNOSIS — F331 Major depressive disorder, recurrent, moderate: Secondary | ICD-10-CM

## 2021-03-16 DIAGNOSIS — F3181 Bipolar II disorder: Secondary | ICD-10-CM

## 2021-03-16 DIAGNOSIS — F909 Attention-deficit hyperactivity disorder, unspecified type: Secondary | ICD-10-CM

## 2021-03-16 MED ORDER — AMPHETAMINE-DEXTROAMPHET ER 30 MG PO CP24: 30.0000 mg | ORAL_CAPSULE | Freq: Every day | ORAL | 0 refills | Status: DC

## 2021-03-16 MED ORDER — AMPHETAMINE-DEXTROAMPHET ER 30 MG PO CP24
30.0000 mg | ORAL_CAPSULE | Freq: Every day | ORAL | 0 refills | Status: DC
  Filled 2021-05-20: qty 20, 20d supply, fill #0

## 2021-03-16 MED ORDER — AMPHETAMINE-DEXTROAMPHET ER 30 MG PO CP24
30.0000 mg | ORAL_CAPSULE | Freq: Every day | ORAL | 0 refills | Status: DC
  Filled 2021-03-16 – 2021-04-15 (×2): qty 30, 30d supply, fill #0

## 2021-03-16 NOTE — Progress Notes (Signed)
Alexis [name] [id number] Nov 05, 1990 31 y.o.  Subjective:   Patient ID:  Alexis [name] is a 31 y.o. (DOB 1990/07/10) female.  Chief Complaint: No chief complaint on file.   HPI ANTONETTA CLANTON presents to the office today for follow-up of ADHD, GAD, MDD, and BPD 2.   Describes mood today as "ok". Pleasant. Mood symptoms - denies depression, anxiety, and irritability. Stating "I'm doing alright". Increased stressors in the work setting - changed departments. Has a trip planned to [facility] - 10/2021. Feels like medication continues to work well. Stable interest and motivation. Taking medications as prescribed.  Energy levels stable. Active, has a regular exercise routine.  Enjoys some usual interests and activities. Single. Lives alone. Spending time with family - parents local. Talking with friends.  Appetite adequate. Weight stable - 135 pounds.  Sleeps better some nights than others. Averages 5 hours lately. Focus and concentration stable. Completing tasks. Managing aspects of household. Work going well. Pet sitting - "going well". Denies SI or HI.  Denies AH or VH.       Review of Systems:  Review of Systems  Musculoskeletal:  Negative for gait problem.  Neurological:  Negative for tremors.  Psychiatric/Behavioral:         Please refer to HPI   Medications: I have reviewed the patient's current medications.  Current Outpatient Medications  Medication Sig Dispense Refill   amphetamine-dextroamphetamine (ADDERALL XR) 30 MG 24 hr capsule Take 1 capsule (30 mg total) by mouth daily. 30 capsule 0   [START ON 04/13/2021] amphetamine-dextroamphetamine (ADDERALL XR) 30 MG 24 hr capsule Take 1 capsule (30 mg total) by mouth daily. 30 capsule 0   [START ON 05/11/2021] amphetamine-dextroamphetamine (ADDERALL XR) 30 MG 24 hr capsule Take 1 capsule (30 mg total) by mouth daily. 30 capsule 0   benzonatate (TESSALON PERLES) 100 MG capsule Take 1 capsule (100 mg total) by mouth  3 (three) times daily as needed. 20 capsule 0   buPROPion (WELLBUTRIN XL) 150 MG 24 hr tablet Take 1 tablet (150 mg total) by mouth daily. 90 tablet 3   HYPERCARE 20 % external solution Apply externally to skin daily at bedtime 35 mL 0   lamoTRIgine (LAMICTAL) 150 MG tablet Take 1 tablet (150 mg total) by mouth at bedtime. 90 tablet 3   levonorgestrel (MIRENA) 20 MCG/DAY IUD 1 each by Intrauterine route once.     Multiple Vitamin (MULTI-VITAMIN PO) Take by mouth.     No current facility-administered medications for this visit.    Medication Side Effects: None  Allergies:  Allergies  Allergen Reactions   Hornet Venom Anaphylaxis    Past Medical History:  Diagnosis Date   ACNE VULGARIS 04/28/2007   Alcohol abuse, in remission     hx of amnesia    ALCOHOL USE 07/22/2009   Anxiety    ATTENTION DEFICIT DISORDER 04/28/2007    hx of speech therapy   Bipolar disorder (HCC)    Depression    EXERCISE INDUCED ASTHMA 04/28/2007   History of pyelonephritis 05/2009   HSV (herpes simplex virus) anogenital infection     Past Medical History, Surgical history, Social history, and Family history were reviewed and updated as appropriate.   Please see review of systems for further details on the patient's review from today.   Objective:   Physical Exam:  There were no vitals taken for this visit.  Physical Exam Constitutional:      General: She is not in acute distress. Musculoskeletal:  General: No deformity.  Neurological:     Mental Status: She is alert and oriented to person, place, and time.     Coordination: Coordination normal.  Psychiatric:        Attention and Perception: Attention and perception normal. She does not perceive auditory or visual hallucinations.        Mood and Affect: Mood normal. Mood is not anxious or depressed. Affect is not labile, blunt, angry or inappropriate.        Speech: Speech normal.        Behavior: Behavior normal.        Thought Content:  Thought content normal. Thought content is not paranoid or delusional. Thought content does not include homicidal or suicidal ideation. Thought content does not include homicidal or suicidal plan.        Cognition and Memory: Cognition and memory normal.        Judgment: Judgment normal.     Comments: Insight intact    Lab Review:     Component Value Date/Time   NA 143 05/19/2019 2245   K 4.0 05/19/2019 2245   CL 109 05/19/2019 2245   CO2 21 (L) 05/19/2019 2245   GLUCOSE 92 05/19/2019 2245   BUN 10 05/19/2019 2245   CREATININE 0.50 05/19/2019 2245   CALCIUM 9.2 05/19/2019 2245   PROT 8.6 (H) 05/19/2019 2245   ALBUMIN 4.6 05/19/2019 2245   AST 18 05/19/2019 2245   ALT 19 05/19/2019 2245   ALKPHOS 80 05/19/2019 2245   BILITOT 0.4 05/19/2019 2245   GFRNONAA >60 05/19/2019 2245   GFRAA >60 05/19/2019 2245       Component Value Date/Time   WBC 9.8 05/19/2019 2245   RBC 5.37 (H) 05/19/2019 2245   HGB 16.8 (H) 05/19/2019 2245   HCT 49.3 (H) 05/19/2019 2245   PLT 297 05/19/2019 2245   MCV 91.8 05/19/2019 2245   MCV 90.3 08/21/2012 2004   MCH 31.3 05/19/2019 2245   MCHC 34.1 05/19/2019 2245   RDW 11.9 05/19/2019 2245   LYMPHSABS 2.2 05/19/2019 2245   MONOABS 0.5 05/19/2019 2245   EOSABS 0.0 05/19/2019 2245   BASOSABS 0.0 05/19/2019 2245    No results found for: POCLITH, LITHIUM   No results found for: PHENYTOIN, PHENOBARB, VALPROATE, CBMZ   .res Assessment: Plan:    Plan:  Adderall XR 30mg  daily Lamictal 150mg  daily Wellbutrin XL 150mg  daily  BP WNL  RTC 3 months  Patient advised to contact office with any questions, adverse effects, or acute worsening in signs and symptoms.  Discussed potential benefits, risks, and side effects of stimulants with patient to include increased heart rate, palpitations, insomnia, increased anxiety, increased irritability, or decreased appetite.  Instructed patient to contact office if experiencing any significant tolerability  issues.  Counseled patient regarding potential benefits, risks, and side effects of Lamictal to include potential risk of Stevens-Johnson syndrome. Advised patient to stop taking Lamictal and contact office immediately if rash develops and to seek urgent medical attention if rash is severe and/or spreading quickly. Diagnoses and all orders for this visit:  Major depressive disorder, recurrent episode, moderate (HCC)  Attention deficit hyperactivity disorder (ADHD), unspecified ADHD type -     amphetamine-dextroamphetamine (ADDERALL XR) 30 MG 24 hr capsule; Take 1 capsule (30 mg total) by mouth daily. -     amphetamine-dextroamphetamine (ADDERALL XR) 30 MG 24 hr capsule; Take 1 capsule (30 mg total) by mouth daily. -     amphetamine-dextroamphetamine (ADDERALL XR) 30 MG  24 hr capsule; Take 1 capsule (30 mg total) by mouth daily.  Bipolar II disorder (HCC)  Generalized anxiety disorder     Please see After Visit Summary for patient specific instructions.  No future appointments.  No orders of the defined types were placed in this encounter.   -------------------------------

## 2021-03-26 ENCOUNTER — Ambulatory Visit: Payer: BC Managed Care – PPO | Admitting: Adult Health

## 2021-03-26 ENCOUNTER — Ambulatory Visit (INDEPENDENT_AMBULATORY_CARE_PROVIDER_SITE_OTHER): Payer: BC Managed Care – PPO | Admitting: Adult Health

## 2021-03-26 VITALS — BP 118/80 | HR 72 | Temp 98.6°F | Ht 64.5 in | Wt 136.0 lb

## 2021-03-26 DIAGNOSIS — R8290 Unspecified abnormal findings in urine: Secondary | ICD-10-CM | POA: Diagnosis not present

## 2021-03-26 DIAGNOSIS — N3000 Acute cystitis without hematuria: Secondary | ICD-10-CM

## 2021-03-26 LAB — POCT URINALYSIS DIPSTICK
Bilirubin, UA: NEGATIVE
Blood, UA: NEGATIVE
Ketones, UA: NEGATIVE
Nitrite, UA: POSITIVE
Protein, UA: POSITIVE — AB
Spec Grav, UA: 1.02 (ref 1.010–1.025)
Urobilinogen, UA: 1 E.U./dL
pH, UA: 6 (ref 5.0–8.0)

## 2021-03-26 MED ORDER — NITROFURANTOIN MONOHYD MACRO 100 MG PO CAPS: 100.0000 mg | ORAL_CAPSULE | Freq: Two times a day (BID) | ORAL | 0 refills | Status: DC

## 2021-03-26 NOTE — Progress Notes (Signed)
Subjective:    Patient ID: Alexis [name], female    DOB: 18-May-1990, 31 y.o.   MRN: [id number]  HPI 31 year old female who  has a past medical history of ACNE VULGARIS (04/28/2007), Alcohol abuse, in remission, ALCOHOL USE (07/22/2009), Anxiety, ATTENTION DEFICIT DISORDER (04/28/2007), Bipolar disorder ([place]), Depression, EXERCISE INDUCED ASTHMA (04/28/2007), History of pyelonephritis (05/2009), and HSV (herpes simplex virus) anogenital infection.  She presents to the office today for an acute issue. She has history of UTI in the past. Her symptoms include that of odorous urine and non completely emptying her bladder.   No fevers or chills, frequency, urgency, hematuria, or dysuria.   Reports that the symptoms she currently has are usually how her UTI's develop   Review of Systems See HPI   Past Medical History:  Diagnosis Date   ACNE VULGARIS 04/28/2007   Alcohol abuse, in remission     hx of amnesia    ALCOHOL USE 07/22/2009   Anxiety    ATTENTION DEFICIT DISORDER 04/28/2007    hx of speech therapy   Bipolar disorder ([place])    Depression    EXERCISE INDUCED ASTHMA 04/28/2007   History of pyelonephritis 05/2009   HSV (herpes simplex virus) anogenital infection     Social History   Socioeconomic History   Marital status: Single    Spouse name: Not on file   Number of children: Not on file   Years of education: Not on file   Highest education level: Not on file  Occupational History   Not on file  Tobacco Use   Smoking status: Every Day    Packs/day: 0.25    Types: Cigarettes   Smokeless tobacco: Never  Vaping Use   Vaping Use: Never used  Substance and Sexual Activity   Alcohol use: Yes    Alcohol/week: 14.0 - 20.0 standard drinks    Types: 14 - 20 Glasses of wine per week   Drug use: No    Comment: In the past- Pot 3 months ago   Sexual activity: Yes    Birth control/protection: Pill  Other Topics Concern   Not on file  Social History Narrative   [facility]  history  And english  Dropped out temporarily getting psych help  On line courses  In past    No ets    [employer]    Volunteered  [employer]   Limited etoh   Stopped tobacco( now 2-3 x per week)   Working [employer] 15- 20 hours per week.   [employer] to finish school degress   Social Determinants of Health   Financial Resource Strain: Not on file  Food Insecurity: Not on file  Transportation Needs: Not on file  Physical Activity: Not on file  Stress: Not on file  Social Connections: Not on file  Intimate Partner Violence: Not on file    No past surgical history on file.  Family History  Problem Relation Age of Onset   Heart attack Mother    Asthma Mother    Alcohol abuse Paternal Grandmother    Depression Paternal Grandmother    Diabetes type II Maternal Grandmother    Dementia Maternal Grandfather    Stroke Paternal Grandfather     Allergies  Allergen Reactions   Hornet Venom Anaphylaxis    Current Outpatient Medications on File Prior to Visit  Medication Sig Dispense Refill   amphetamine-dextroamphetamine (ADDERALL XR) 30 MG 24 hr capsule Take 1 capsule (30 mg total) by mouth  daily. 30 capsule 0   [START ON 04/13/2021] amphetamine-dextroamphetamine (ADDERALL XR) 30 MG 24 hr capsule Take 1 capsule (30 mg total) by mouth daily. 30 capsule 0   [START ON 05/11/2021] amphetamine-dextroamphetamine (ADDERALL XR) 30 MG 24 hr capsule Take 1 capsule (30 mg total) by mouth daily. 30 capsule 0   buPROPion (WELLBUTRIN XL) 150 MG 24 hr tablet Take 1 tablet (150 mg total) by mouth daily. 90 tablet 3   lamoTRIgine (LAMICTAL) 150 MG tablet Take 1 tablet (150 mg total) by mouth at bedtime. 90 tablet 3   levonorgestrel (MIRENA) 20 MCG/DAY IUD 1 each by Intrauterine route once.     Multiple Vitamin (MULTI-VITAMIN PO) Take by mouth.     No current facility-administered medications on file prior to visit.    BP 118/80    Pulse 72    Temp 98.6 F (37 C) (Oral)    Ht 5' 4.5"  (1.638 m)    Wt 136 lb (61.7 kg)    SpO2 95%    BMI 22.98 kg/m       Objective:   Physical Exam Vitals and nursing note reviewed.  Constitutional:      Appearance: Normal appearance.  Cardiovascular:     Rate and Rhythm: Normal rate and regular rhythm.  Abdominal:     General: Abdomen is flat. Bowel sounds are normal.     Palpations: Abdomen is soft.     Tenderness: There is abdominal tenderness (lower pelvic pressure).  Musculoskeletal:        General: Normal range of motion.  Skin:    General: Skin is warm and dry.     Capillary Refill: Capillary refill takes less than 2 seconds.  Neurological:     Mental Status: She is alert.  Psychiatric:        Mood and Affect: Mood normal.        Behavior: Behavior normal.        Thought Content: Thought content normal.        Judgment: Judgment normal.      Assessment & Plan:  1. Acute cystitis without hematuria  - POC Urinalysis Dipstick + nitrites, leuks, and protein.  - Will treat with Macrobid and send culture  - Encouraged to increase water intake  - Urine Culture - nitrofurantoin, macrocrystal-monohydrate, (MACROBID) 100 MG capsule; Take 1 capsule (100 mg total) by mouth 2 (two) times daily.  Dispense: 10 capsule; Refill: 0  Dorothyann Peng, NP

## 2021-03-28 LAB — URINE CULTURE
MICRO NUMBER:: 12986997
SPECIMEN QUALITY:: ADEQUATE

## 2021-04-15 ENCOUNTER — Other Ambulatory Visit (HOSPITAL_COMMUNITY): Payer: Self-pay

## 2021-05-20 ENCOUNTER — Other Ambulatory Visit (HOSPITAL_COMMUNITY): Payer: Self-pay

## 2021-06-09 ENCOUNTER — Telehealth: Payer: Self-pay | Admitting: Adult Health

## 2021-06-09 ENCOUNTER — Other Ambulatory Visit (HOSPITAL_COMMUNITY): Payer: Self-pay

## 2021-06-09 ENCOUNTER — Other Ambulatory Visit: Payer: Self-pay

## 2021-06-09 DIAGNOSIS — F909 Attention-deficit hyperactivity disorder, unspecified type: Secondary | ICD-10-CM

## 2021-06-09 MED ORDER — AMPHETAMINE-DEXTROAMPHET ER 30 MG PO CP24
30.0000 mg | ORAL_CAPSULE | Freq: Every day | ORAL | 0 refills | Status: DC
  Filled 2021-06-09: qty 30, 30d supply, fill #0

## 2021-06-09 NOTE — Telephone Encounter (Signed)
Pt called reporting picked up Adderall XR 30 mg  @ [facility] 4/5 for only 20 caps. All they had in stock. Requesting to send Rx for 10 caps to complete March Rx. Then, will need a May Rx also for #30.  ?

## 2021-06-09 NOTE — Telephone Encounter (Signed)
Rx for #10 to finish out that month. Eff 5/5  a new Rx for #30.  ?

## 2021-06-09 NOTE — Telephone Encounter (Signed)
Just do a new script at previous does.

## 2021-06-09 NOTE — Telephone Encounter (Signed)
How many capsules - or can we just send a new script.

## 2021-06-09 NOTE — Telephone Encounter (Signed)
Pended.

## 2021-06-12 ENCOUNTER — Other Ambulatory Visit (HOSPITAL_COMMUNITY): Payer: Self-pay

## 2021-06-18 ENCOUNTER — Ambulatory Visit: Payer: BC Managed Care – PPO | Admitting: Adult Health

## 2021-06-22 ENCOUNTER — Encounter: Payer: Self-pay | Admitting: Adult Health

## 2021-06-22 ENCOUNTER — Ambulatory Visit (INDEPENDENT_AMBULATORY_CARE_PROVIDER_SITE_OTHER): Payer: BC Managed Care – PPO | Admitting: Adult Health

## 2021-06-22 ENCOUNTER — Other Ambulatory Visit (HOSPITAL_COMMUNITY): Payer: Self-pay

## 2021-06-22 DIAGNOSIS — F331 Major depressive disorder, recurrent, moderate: Secondary | ICD-10-CM

## 2021-06-22 DIAGNOSIS — F909 Attention-deficit hyperactivity disorder, unspecified type: Secondary | ICD-10-CM

## 2021-06-22 DIAGNOSIS — F3181 Bipolar II disorder: Secondary | ICD-10-CM | POA: Diagnosis not present

## 2021-06-22 DIAGNOSIS — F411 Generalized anxiety disorder: Secondary | ICD-10-CM | POA: Diagnosis not present

## 2021-06-22 MED ORDER — AMPHETAMINE-DEXTROAMPHET ER 30 MG PO CP24
30.0000 mg | ORAL_CAPSULE | Freq: Every day | ORAL | 0 refills | Status: DC
  Filled 2021-08-21: qty 30, 30d supply, fill #0

## 2021-06-22 MED ORDER — AMPHETAMINE-DEXTROAMPHET ER 30 MG PO CP24
30.0000 mg | ORAL_CAPSULE | Freq: Every day | ORAL | 0 refills | Status: DC
  Filled 2021-06-22 – 2021-07-20 (×2): qty 30, 30d supply, fill #0

## 2021-06-22 MED ORDER — BUPROPION HCL ER (XL) 150 MG PO TB24
150.0000 mg | ORAL_TABLET | Freq: Every day | ORAL | 3 refills | Status: DC
  Filled 2021-06-22 – 2021-07-20 (×2): qty 90, 90d supply, fill #0

## 2021-06-22 MED ORDER — LAMOTRIGINE 150 MG PO TABS
150.0000 mg | ORAL_TABLET | Freq: Every day | ORAL | 3 refills | Status: DC
Start: 2021-06-22 — End: 2022-03-23
  Filled 2021-06-22 – 2021-07-20 (×2): qty 90, 90d supply, fill #0

## 2021-06-22 MED ORDER — AMPHETAMINE-DEXTROAMPHET ER 30 MG PO CP24
30.0000 mg | ORAL_CAPSULE | Freq: Every day | ORAL | 0 refills | Status: DC
  Filled 2021-09-25: qty 30, 30d supply, fill #0

## 2021-06-22 NOTE — Progress Notes (Signed)
Alexis [name] ?[id number] ?01-23-1991 ?31 y.o. ? ?Subjective:  ? ?Patient ID:  Alexis [name] is a 31 y.o. (DOB [birth year]) female. ? ?Chief Complaint: No chief complaint on file. ? ? ?HPI ?Alexis [name] presents to the office today for follow-up of ADHD, GAD, MDD, and BPD 2.  ? ?Describes mood today as "ok". Pleasant. Mood symptoms - reports some depression, anxiety, and irritability. Mood is consistent. Stating "I feel in control". Increased stressors in the work setting.  Has a trip planned to [facility] - 10/2021. Feels like medication continues to work well. Stable interest and motivation. Taking medications as prescribed.  ?Energy levels stable. Active, has a regular exercise routine.  ?Enjoys some usual interests and activities. Single. Lives alone. Spending time with family - parents local. Talking with friends.  ?Appetite adequate. Weight stable - 135 pounds.  ?Sleeps better some nights than others. Averages 5 hours. ?Focus and concentration stable. Completing tasks. Managing aspects of household. Work going well. Pet sitting. ?Denies SI or HI.  ?Denies AH or VH. ? ? ?PHQ2-9   ? ?[place] Office Visit from 03/26/2021 in [place] at [place]  ?PHQ-2 Total Score 1  ?PHQ-9 Total Score 5  ? ?  ?  ? ?Review of Systems:  ?Review of Systems  ?Musculoskeletal:  Negative for gait problem.  ?Neurological:  Negative for tremors.  ?Psychiatric/Behavioral:    ?     Please refer to HPI  ? ?Medications: I have reviewed the patient's current medications. ? ?Current Outpatient Medications  ?Medication Sig Dispense Refill  ? amphetamine-dextroamphetamine (ADDERALL XR) 30 MG 24 hr capsule Take 1 capsule (30 mg total) by mouth daily. 30 capsule 0  ? [START ON 07/20/2021] amphetamine-dextroamphetamine (ADDERALL XR) 30 MG 24 hr capsule Take 1 capsule (30 mg total) by mouth daily. 30 capsule 0  ? [START ON 08/17/2021] amphetamine-dextroamphetamine (ADDERALL XR) 30 MG 24 hr capsule Take 1 capsule (30 mg  total) by mouth daily. 30 capsule 0  ? buPROPion (WELLBUTRIN XL) 150 MG 24 hr tablet Take 1 tablet (150 mg total) by mouth daily. 90 tablet 3  ? lamoTRIgine (LAMICTAL) 150 MG tablet Take 1 tablet (150 mg total) by mouth at bedtime. 90 tablet 3  ? levonorgestrel (MIRENA) 20 MCG/DAY IUD 1 each by Intrauterine route once.    ? Multiple Vitamin (MULTI-VITAMIN PO) Take by mouth.    ? nitrofurantoin, macrocrystal-monohydrate, (MACROBID) 100 MG capsule Take 1 capsule (100 mg total) by mouth 2 (two) times daily. 10 capsule 0  ? ?No current facility-administered medications for this visit.  ? ? ?Medication Side Effects: None ? ?Allergies:  ?Allergies  ?Allergen Reactions  ? Hornet Venom Anaphylaxis  ? ? ?Past Medical History:  ?Diagnosis Date  ? ACNE VULGARIS 04/28/2007  ? Alcohol abuse, in remission   ?  hx of amnesia   ? ALCOHOL USE 07/22/2009  ? Anxiety   ? ATTENTION DEFICIT DISORDER 04/28/2007  ?  hx of speech therapy  ? Bipolar disorder ([place])   ? Depression   ? EXERCISE INDUCED ASTHMA 04/28/2007  ? History of pyelonephritis 05/2009  ? HSV (herpes simplex virus) anogenital infection   ? ? ?Past Medical History, Surgical history, Social history, and Family history were reviewed and updated as appropriate.  ? ?Please see review of systems for further details on the patient's review from today.  ? ?Objective:  ? ?Physical Exam:  ?There were no vitals taken for this visit. ? ?Physical Exam ?Constitutional:   ?   General:  She is not in acute distress. ?Musculoskeletal:     ?   General: No deformity.  ?Neurological:  ?   Mental Status: She is alert and oriented to person, place, and time.  ?   Coordination: Coordination normal.  ?Psychiatric:     ?   Attention and Perception: Attention and perception normal. She does not perceive auditory or visual hallucinations.     ?   Mood and Affect: Mood normal. Mood is not anxious or depressed. Affect is not labile, blunt, angry or inappropriate.     ?   Speech: Speech normal.     ?    Behavior: Behavior normal.     ?   Thought Content: Thought content normal. Thought content is not paranoid or delusional. Thought content does not include homicidal or suicidal ideation. Thought content does not include homicidal or suicidal plan.     ?   Cognition and Memory: Cognition and memory normal.     ?   Judgment: Judgment normal.  ?   Comments: Insight intact  ? ? ?Lab Review:  ?   ?Component Value Date/Time  ? NA 143 05/19/2019 2245  ? K 4.0 05/19/2019 2245  ? CL 109 05/19/2019 2245  ? CO2 21 (L) 05/19/2019 2245  ? GLUCOSE 92 05/19/2019 2245  ? BUN 10 05/19/2019 2245  ? CREATININE 0.50 05/19/2019 2245  ? CALCIUM 9.2 05/19/2019 2245  ? PROT 8.6 (H) 05/19/2019 2245  ? ALBUMIN 4.6 05/19/2019 2245  ? AST 18 05/19/2019 2245  ? ALT 19 05/19/2019 2245  ? ALKPHOS 80 05/19/2019 2245  ? BILITOT 0.4 05/19/2019 2245  ? GFRNONAA >60 05/19/2019 2245  ? GFRAA >60 05/19/2019 2245  ? ? ?   ?Component Value Date/Time  ? WBC 9.8 05/19/2019 2245  ? RBC 5.37 (H) 05/19/2019 2245  ? HGB 16.8 (H) 05/19/2019 2245  ? HCT 49.3 (H) 05/19/2019 2245  ? PLT 297 05/19/2019 2245  ? MCV 91.8 05/19/2019 2245  ? MCV 90.3 08/21/2012 2004  ? MCH 31.3 05/19/2019 2245  ? MCHC 34.1 05/19/2019 2245  ? RDW 11.9 05/19/2019 2245  ? LYMPHSABS 2.2 05/19/2019 2245  ? MONOABS 0.5 05/19/2019 2245  ? EOSABS 0.0 05/19/2019 2245  ? BASOSABS 0.0 05/19/2019 2245  ? ? ?No results found for: POCLITH, LITHIUM  ? ?No results found for: PHENYTOIN, PHENOBARB, VALPROATE, CBMZ  ? ?.res ?Assessment: Plan:   ? ?Plan: ? ?Adderall XR 30mg  daily ?Lamictal 150mg  daily ?Wellbutrin XL 150mg  daily ? ?BP WNL ? ?RTC 4 months ? ?Patient advised to contact office with any questions, adverse effects, or acute worsening in signs and symptoms. ? ?Discussed potential benefits, risks, and side effects of stimulants with patient to include increased heart rate, palpitations, insomnia, increased anxiety, increased irritability, or decreased appetite.  Instructed patient to contact  office if experiencing any significant tolerability issues. ? ?Counseled patient regarding potential benefits, risks, and side effects of Lamictal to include potential risk of Stevens-Johnson syndrome. Advised patient to stop taking Lamictal and contact office immediately if rash develops and to seek urgent medical attention if rash is severe and/or spreading quickly. ? ?Diagnoses and all orders for this visit: ? ?Major depressive disorder, recurrent episode, moderate (HCC) ?-     lamoTRIgine (LAMICTAL) 150 MG tablet; Take 1 tablet (150 mg total) by mouth at bedtime. ?-     buPROPion (WELLBUTRIN XL) 150 MG 24 hr tablet; Take 1 tablet (150 mg total) by mouth daily. ? ?Bipolar II disorder ([place]) ?-  lamoTRIgine (LAMICTAL) 150 MG tablet; Take 1 tablet (150 mg total) by mouth at bedtime. ? ?Generalized anxiety disorder ?-     buPROPion (WELLBUTRIN XL) 150 MG 24 hr tablet; Take 1 tablet (150 mg total) by mouth daily. ? ?Attention deficit hyperactivity disorder (ADHD), unspecified ADHD type ?-     amphetamine-dextroamphetamine (ADDERALL XR) 30 MG 24 hr capsule; Take 1 capsule (30 mg total) by mouth daily. ?-     amphetamine-dextroamphetamine (ADDERALL XR) 30 MG 24 hr capsule; Take 1 capsule (30 mg total) by mouth daily. ?-     amphetamine-dextroamphetamine (ADDERALL XR) 30 MG 24 hr capsule; Take 1 capsule (30 mg total) by mouth daily. ? ?  ? ?Please see After Visit Summary for patient specific instructions. ? ?Future Appointments  ?Date Time Provider [place]  ?09/21/2021  3:40 PM Saad Buhl, Berdie Ogren, NP CP-CP None  ? ? ?No orders of the defined types were placed in this encounter. ? ? ?------------------------------- ?

## 2021-06-24 ENCOUNTER — Other Ambulatory Visit (HOSPITAL_COMMUNITY): Payer: Self-pay

## 2021-06-30 ENCOUNTER — Other Ambulatory Visit (HOSPITAL_COMMUNITY): Payer: Self-pay

## 2021-07-20 ENCOUNTER — Other Ambulatory Visit (HOSPITAL_COMMUNITY): Payer: Self-pay

## 2021-08-21 ENCOUNTER — Other Ambulatory Visit (HOSPITAL_COMMUNITY): Payer: Self-pay

## 2021-09-14 ENCOUNTER — Telehealth: Payer: Self-pay | Admitting: *Deleted

## 2021-09-14 NOTE — Telephone Encounter (Signed)
Dr. Fabian Sharp stated patient dropped off a form. The form didn't have patient first name on it. Provider just stated patient last name. Called patient to see if she dropped off a form for [employer]. If so, need to know if patient will come by office to pick up form or if form need to be faxed.

## 2021-09-21 ENCOUNTER — Ambulatory Visit: Payer: BC Managed Care – PPO | Admitting: Adult Health

## 2021-09-25 ENCOUNTER — Other Ambulatory Visit (HOSPITAL_COMMUNITY): Payer: Self-pay

## 2021-09-30 ENCOUNTER — Telehealth: Payer: Self-pay | Admitting: Internal Medicine

## 2021-09-30 NOTE — Telephone Encounter (Addendum)
Pt called to request a prescription for Ambien. She states she is going out of town and needs it for the plane ride.  Last OV:  03/26/2021  Does MD want Pt to come in for an OV?  Please advise. [phone]   [facility] Phone:  [phone]  Fax:  [phone]

## 2021-09-30 NOTE — Telephone Encounter (Signed)
Spoke to patient. Advise her to make an appointment in person or virtual visit. Patient ask if it will cost her. Inform her- yes, would be similar to her other appointment. Patient said she would give a call back tomorrow.

## 2021-10-06 ENCOUNTER — Telehealth: Payer: Self-pay | Admitting: Internal Medicine

## 2021-10-06 NOTE — Telephone Encounter (Signed)
Pt called to request medication to treat a UTI.  Pt was offered an OV.  LOV: 03/26/21  Pt was very rude and yelling demanding to drop off her urine and not have to speak to MD.  Pt finally agreed to an OV.  Then called back an hour later to cancel, stating she was very upset that we would not just accept her urine.

## 2021-10-06 NOTE — Telephone Encounter (Signed)
Have her get urinalysis and culture  to lab and then virtual visit ok to   decide treatment

## 2021-10-07 ENCOUNTER — Ambulatory Visit: Payer: BC Managed Care – PPO | Admitting: Family Medicine

## 2021-10-07 NOTE — Telephone Encounter (Signed)
Pt had actually called at 4 pm on 10/06/21 and wanted to just drop off her sample at the window. Pt was advised that although we close at 5 pm, the lab closes at 4:30 pm.   Pt did not want to understand that she needed a lab appointment and could not just drop off sample at the window after 4:30 pm.   Pt stated "they let her just drop off her sample at other clinics" and could not understand why we would not be able to do that for her.   Pt called back at 4:45 pm on 10/06/21 to say she would go somewhere else.

## 2021-10-07 NOTE — Telephone Encounter (Signed)
Attempt to contact patient. Left a voicemail to call back.

## 2021-10-08 DIAGNOSIS — N3000 Acute cystitis without hematuria: Secondary | ICD-10-CM | POA: Diagnosis not present

## 2021-10-08 DIAGNOSIS — R030 Elevated blood-pressure reading, without diagnosis of hypertension: Secondary | ICD-10-CM | POA: Diagnosis not present

## 2021-10-08 DIAGNOSIS — N3001 Acute cystitis with hematuria: Secondary | ICD-10-CM | POA: Diagnosis not present

## 2021-10-08 DIAGNOSIS — R350 Frequency of micturition: Secondary | ICD-10-CM | POA: Diagnosis not present

## 2021-10-08 DIAGNOSIS — Z6822 Body mass index (BMI) 22.0-22.9, adult: Secondary | ICD-10-CM | POA: Diagnosis not present

## 2021-10-08 NOTE — Telephone Encounter (Signed)
Spoke to patient. Patient reports she already made an appointment at somewhere else.

## 2021-10-12 ENCOUNTER — Other Ambulatory Visit (HOSPITAL_COMMUNITY): Payer: Self-pay

## 2021-10-12 ENCOUNTER — Telehealth (INDEPENDENT_AMBULATORY_CARE_PROVIDER_SITE_OTHER): Payer: BC Managed Care – PPO | Admitting: Adult Health

## 2021-10-12 ENCOUNTER — Encounter: Payer: Self-pay | Admitting: Adult Health

## 2021-10-12 DIAGNOSIS — F909 Attention-deficit hyperactivity disorder, unspecified type: Secondary | ICD-10-CM

## 2021-10-12 MED ORDER — AMPHETAMINE-DEXTROAMPHET ER 30 MG PO CP24
30.0000 mg | ORAL_CAPSULE | Freq: Every day | ORAL | 0 refills | Status: DC
  Filled 2021-12-04: qty 30, 30d supply, fill #0

## 2021-10-12 MED ORDER — AMPHETAMINE-DEXTROAMPHET ER 30 MG PO CP24
30.0000 mg | ORAL_CAPSULE | Freq: Every day | ORAL | 0 refills | Status: DC
  Filled 2021-10-12 – 2021-10-30 (×2): qty 30, 30d supply, fill #0

## 2021-10-12 MED ORDER — AMPHETAMINE-DEXTROAMPHET ER 30 MG PO CP24
30.0000 mg | ORAL_CAPSULE | Freq: Every day | ORAL | 0 refills | Status: DC
  Filled 2022-02-01 – 2022-02-05 (×2): qty 30, 30d supply, fill #0

## 2021-10-12 NOTE — Progress Notes (Signed)
Alexis [name] [id number] September 04, 1990 31 y.o.  Subjective:   Patient ID:  Alexis [name] is a 31 y.o. (DOB [birth year]) female.  Chief Complaint: No chief complaint on file.   HPI Alexis [name] presents to the office today for follow-up of ADHD, GAD, MDD, and BPD 2.   Describes mood today as "ok". Pleasant. Mood symptoms - denies depression, anxiety and irritability. Mood is consistent. Stating "I'm doing pretty good". Feels like medication continues to work well. Upcoming trip to [facility] - 10/2021. Stable interest and motivation. Taking medications as prescribed.  Energy levels stable. Active, has a regular exercise routine.  Enjoys some usual interests and activities. Single. Lives alone. Spending time with family - parents local. Talking with friends.  Appetite adequate. Weight loss - 130 pounds.  Sleeps better some nights than others. Averages 6 to 8 hours. Focus and concentration stable. Completing tasks. Managing aspects of household. 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  Denies AH or VH.    PHQ2-9    Flowsheet Row Office Visit from 03/26/2021 in [facility] at [facility] Total Score 1  PHQ-9 Total Score 5        Review of Systems:  Review of Systems  Musculoskeletal:  Negative for gait problem.  Neurological:  Negative for tremors.  Psychiatric/Behavioral:         Please refer to HPI    Medications: I have reviewed the patient's current medications.  Current Outpatient Medications  Medication Sig Dispense Refill   amphetamine-dextroamphetamine (ADDERALL XR) 30 MG 24 hr capsule Take 1 capsule (30 mg total) by mouth daily. 30 capsule 0   [START ON 11/09/2021] amphetamine-dextroamphetamine (ADDERALL XR) 30 MG 24 hr capsule Take 1 capsule (30 mg total) by mouth daily. 30 capsule 0   [START ON 12/07/2021] amphetamine-dextroamphetamine (ADDERALL XR) 30 MG 24 hr capsule Take 1 capsule (30 mg total) by mouth daily. 30  capsule 0   buPROPion (WELLBUTRIN XL) 150 MG 24 hr tablet Take 1 tablet (150 mg total) by mouth daily. 90 tablet 3   lamoTRIgine (LAMICTAL) 150 MG tablet Take 1 tablet (150 mg total) by mouth at bedtime. 90 tablet 3   levonorgestrel (MIRENA) 20 MCG/DAY IUD 1 each by Intrauterine route once.     Multiple Vitamin (MULTI-VITAMIN PO) Take by mouth.     nitrofurantoin, macrocrystal-monohydrate, (MACROBID) 100 MG capsule Take 1 capsule (100 mg total) by mouth 2 (two) times daily. 10 capsule 0   No current facility-administered medications for this visit.    Medication Side Effects: None  Allergies:  Allergies  Allergen Reactions   Hornet Venom Anaphylaxis    Past Medical History:  Diagnosis Date   ACNE VULGARIS 04/28/2007   Alcohol abuse, in remission     hx of amnesia    ALCOHOL USE 07/22/2009   Anxiety    ATTENTION DEFICIT DISORDER 04/28/2007    hx of speech therapy   Bipolar disorder (HCC)    Depression    EXERCISE INDUCED ASTHMA 04/28/2007   History of pyelonephritis 05/2009   HSV (herpes simplex virus) anogenital infection     Past Medical History, Surgical history, Social history, and Family history were reviewed and updated as appropriate.   Please see review of systems for further details on the patient's review from today.   Objective:   Physical Exam:  There were no vitals taken for this visit.  Physical Exam Constitutional:      General: She is not  in acute distress. Musculoskeletal:        General: No deformity.  Neurological:     Mental Status: She is alert and oriented to person, place, and time.     Coordination: Coordination normal.  Psychiatric:        Attention and Perception: Attention and perception normal. She does not perceive auditory or visual hallucinations.        Mood and Affect: Mood normal. Mood is not anxious or depressed. Affect is not labile, blunt, angry or inappropriate.        Speech: Speech normal.        Behavior: Behavior normal.         Thought Content: Thought content normal. Thought content is not paranoid or delusional. Thought content does not include homicidal or suicidal ideation. Thought content does not include homicidal or suicidal plan.        Cognition and Memory: Cognition and memory normal.        Judgment: Judgment normal.     Comments: Insight intact     Lab Review:     Component Value Date/Time   NA 143 05/19/2019 2245   K 4.0 05/19/2019 2245   CL 109 05/19/2019 2245   CO2 21 (L) 05/19/2019 2245   GLUCOSE 92 05/19/2019 2245   BUN 10 05/19/2019 2245   CREATININE 0.50 05/19/2019 2245   CALCIUM 9.2 05/19/2019 2245   PROT 8.6 (H) 05/19/2019 2245   ALBUMIN 4.6 05/19/2019 2245   AST 18 05/19/2019 2245   ALT 19 05/19/2019 2245   ALKPHOS 80 05/19/2019 2245   BILITOT 0.4 05/19/2019 2245   GFRNONAA >60 05/19/2019 2245   GFRAA >60 05/19/2019 2245       Component Value Date/Time   WBC 9.8 05/19/2019 2245   RBC 5.37 (H) 05/19/2019 2245   HGB 16.8 (H) 05/19/2019 2245   HCT 49.3 (H) 05/19/2019 2245   PLT 297 05/19/2019 2245   MCV 91.8 05/19/2019 2245   MCV 90.3 08/21/2012 2004   MCH 31.3 05/19/2019 2245   MCHC 34.1 05/19/2019 2245   RDW 11.9 05/19/2019 2245   LYMPHSABS 2.2 05/19/2019 2245   MONOABS 0.5 05/19/2019 2245   EOSABS 0.0 05/19/2019 2245   BASOSABS 0.0 05/19/2019 2245    No results found for: "POCLITH", "LITHIUM"   No results found for: "PHENYTOIN", "PHENOBARB", "VALPROATE", "CBMZ"   .res Assessment: Plan:    Plan:  Adderall XR 30mg  daily Lamictal 150mg  daily Wellbutrin XL 150mg  daily  BP WNL  RTC 4 months  Patient advised to contact office with any questions, adverse effects, or acute worsening in signs and symptoms.  Discussed potential benefits, risks, and side effects of stimulants with patient to include increased heart rate, palpitations, insomnia, increased anxiety, increased irritability, or decreased appetite.  Instructed patient to contact office if experiencing  any significant tolerability issues.  Counseled patient regarding potential benefits, risks, and side effects of Lamictal to include potential risk of Stevens-Johnson syndrome. Advised patient to stop taking Lamictal and contact office immediately if rash develops and to seek urgent medical attention if rash is severe and/or spreading quickly.  Diagnoses and all orders for this visit:  Attention deficit hyperactivity disorder (ADHD), unspecified ADHD type -     amphetamine-dextroamphetamine (ADDERALL XR) 30 MG 24 hr capsule; Take 1 capsule (30 mg total) by mouth daily. -     amphetamine-dextroamphetamine (ADDERALL XR) 30 MG 24 hr capsule; Take 1 capsule (30 mg total) by mouth daily. -  amphetamine-dextroamphetamine (ADDERALL XR) 30 MG 24 hr capsule; Take 1 capsule (30 mg total) by mouth daily.     Please see After Visit Summary for patient specific instructions.  No future appointments.  No orders of the defined types were placed in this encounter.   -------------------------------

## 2021-10-30 ENCOUNTER — Other Ambulatory Visit (HOSPITAL_COMMUNITY): Payer: Self-pay

## 2021-11-17 ENCOUNTER — Other Ambulatory Visit (HOSPITAL_COMMUNITY): Payer: Self-pay

## 2021-11-17 DIAGNOSIS — L814 Other melanin hyperpigmentation: Secondary | ICD-10-CM | POA: Diagnosis not present

## 2021-11-17 DIAGNOSIS — D229 Melanocytic nevi, unspecified: Secondary | ICD-10-CM | POA: Diagnosis not present

## 2021-11-17 DIAGNOSIS — L578 Other skin changes due to chronic exposure to nonionizing radiation: Secondary | ICD-10-CM | POA: Diagnosis not present

## 2021-11-17 DIAGNOSIS — L309 Dermatitis, unspecified: Secondary | ICD-10-CM | POA: Diagnosis not present

## 2021-11-17 MED ORDER — TRIAMCINOLONE ACETONIDE 0.1 % EX CREA
1.0000 | TOPICAL_CREAM | Freq: Two times a day (BID) | CUTANEOUS | 0 refills | Status: DC
  Filled 2021-11-17: qty 45, 30d supply, fill #0

## 2021-11-17 MED ORDER — FLUOCINONIDE 0.05 % EX SOLN
CUTANEOUS | 0 refills | Status: DC
  Filled 2021-11-17: qty 60, 30d supply, fill #0

## 2021-12-04 ENCOUNTER — Other Ambulatory Visit (HOSPITAL_COMMUNITY): Payer: Self-pay

## 2021-12-15 ENCOUNTER — Encounter: Payer: Self-pay | Admitting: Adult Health

## 2021-12-15 ENCOUNTER — Other Ambulatory Visit (HOSPITAL_COMMUNITY): Payer: Self-pay

## 2021-12-15 ENCOUNTER — Ambulatory Visit (INDEPENDENT_AMBULATORY_CARE_PROVIDER_SITE_OTHER): Payer: BC Managed Care – PPO | Admitting: Adult Health

## 2021-12-15 DIAGNOSIS — F411 Generalized anxiety disorder: Secondary | ICD-10-CM

## 2021-12-15 DIAGNOSIS — F3181 Bipolar II disorder: Secondary | ICD-10-CM

## 2021-12-15 DIAGNOSIS — F331 Major depressive disorder, recurrent, moderate: Secondary | ICD-10-CM

## 2021-12-15 DIAGNOSIS — F909 Attention-deficit hyperactivity disorder, unspecified type: Secondary | ICD-10-CM | POA: Diagnosis not present

## 2021-12-15 MED ORDER — LISDEXAMFETAMINE DIMESYLATE 60 MG PO CAPS
60.0000 mg | ORAL_CAPSULE | ORAL | 0 refills | Status: DC
  Filled 2021-12-15: qty 30, 30d supply, fill #0

## 2021-12-15 NOTE — Progress Notes (Signed)
Alexis [name] [id number] Oct 05, 1990 31 y.o.  Subjective:   Patient ID:  Alexis [name] is a 31 y.o. (DOB 22-Oct-1990) female.  Chief Complaint: No chief complaint on file.   HPI EPHRATA VERVILLE presents to the office today for follow-up of ADHD, GAD, MDD, and BPD 2.   Describes mood today as "ok". Pleasant. Mood symptoms - denies depression, anxiety and irritability. Mood is consistent. Stating "I'm doing alright". Feels like current medication regimen works well - would like to try the generic Vyvanse - has taken previously, but stopped due to cost. Stable interest and motivation. Taking medications as prescribed.  Energy levels stable. Active, has a regular exercise routine.  Enjoys some usual interests and activities. Single. Lives alone. Spending time with family - parents local. Talking with friends.  Appetite adequate. Weight stable - 130 pounds.  Sleeps better some nights than others. Averages 6 to 8 hours. Focus and concentration stable. Completing tasks. Managing aspects of household. 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  Denies AH or VH. Denies self harm. Denies substance use.    PHQ2-9    Flowsheet Row Office Visit from 03/26/2021 in [facility] at [facility] Total Score 1  PHQ-9 Total Score 5        Review of Systems:  Review of Systems  Musculoskeletal:  Negative for gait problem.  Neurological:  Negative for tremors.  Psychiatric/Behavioral:         Please refer to HPI    Medications: I have reviewed the patient's current medications.  Current Outpatient Medications  Medication Sig Dispense Refill   lisdexamfetamine (VYVANSE) 60 MG capsule Take 1 capsule (60 mg total) by mouth every morning. 30 capsule 0   amphetamine-dextroamphetamine (ADDERALL XR) 30 MG 24 hr capsule Take 1 capsule (30 mg total) by mouth daily. 30 capsule 0   amphetamine-dextroamphetamine (ADDERALL XR) 30 MG 24 hr capsule Take 1  capsule (30 mg total) by mouth daily. 30 capsule 0   amphetamine-dextroamphetamine (ADDERALL XR) 30 MG 24 hr capsule Take 1 capsule (30 mg total) by mouth daily. 30 capsule 0   buPROPion (WELLBUTRIN XL) 150 MG 24 hr tablet Take 1 tablet (150 mg total) by mouth daily. 90 tablet 3   fluocinonide (LIDEX) 0.05 % external solution Apply drops to scalp Externally Twice a day 60 mL 0   lamoTRIgine (LAMICTAL) 150 MG tablet Take 1 tablet (150 mg total) by mouth at bedtime. 90 tablet 3   levonorgestrel (MIRENA) 20 MCG/DAY IUD 1 each by Intrauterine route once.     Multiple Vitamin (MULTI-VITAMIN PO) Take by mouth.     nitrofurantoin, macrocrystal-monohydrate, (MACROBID) 100 MG capsule Take 1 capsule (100 mg total) by mouth 2 (two) times daily. 10 capsule 0   triamcinolone cream (KENALOG) 0.1 % Apply 1 application topically 2 (two) times daily. 45 g 0   No current facility-administered medications for this visit.    Medication Side Effects: None  Allergies:  Allergies  Allergen Reactions   Hornet Venom Anaphylaxis    Past Medical History:  Diagnosis Date   ACNE VULGARIS 04/28/2007   Alcohol abuse, in remission     hx of amnesia    ALCOHOL USE 07/22/2009   Anxiety    ATTENTION DEFICIT DISORDER 04/28/2007    hx of speech therapy   Bipolar disorder (HCC)    Depression    EXERCISE INDUCED ASTHMA 04/28/2007   History of pyelonephritis 05/2009   HSV (herpes simplex virus)  anogenital infection     Past Medical History, Surgical history, Social history, and Family history were reviewed and updated as appropriate.   Please see review of systems for further details on the patient's review from today.   Objective:   Physical Exam:  There were no vitals taken for this visit.  Physical Exam Constitutional:      General: She is not in acute distress. Musculoskeletal:        General: No deformity.  Neurological:     Mental Status: She is alert and oriented to person, place, and time.      Coordination: Coordination normal.  Psychiatric:        Attention and Perception: Attention and perception normal. She does not perceive auditory or visual hallucinations.        Mood and Affect: Mood normal. Mood is not anxious or depressed. Affect is not labile, blunt, angry or inappropriate.        Speech: Speech normal.        Behavior: Behavior normal.        Thought Content: Thought content normal. Thought content is not paranoid or delusional. Thought content does not include homicidal or suicidal ideation. Thought content does not include homicidal or suicidal plan.        Cognition and Memory: Cognition and memory normal.        Judgment: Judgment normal.     Comments: Insight intact     Lab Review:     Component Value Date/Time   NA 143 05/19/2019 2245   K 4.0 05/19/2019 2245   CL 109 05/19/2019 2245   CO2 21 (L) 05/19/2019 2245   GLUCOSE 92 05/19/2019 2245   BUN 10 05/19/2019 2245   CREATININE 0.50 05/19/2019 2245   CALCIUM 9.2 05/19/2019 2245   PROT 8.6 (H) 05/19/2019 2245   ALBUMIN 4.6 05/19/2019 2245   AST 18 05/19/2019 2245   ALT 19 05/19/2019 2245   ALKPHOS 80 05/19/2019 2245   BILITOT 0.4 05/19/2019 2245   GFRNONAA >60 05/19/2019 2245   GFRAA >60 05/19/2019 2245       Component Value Date/Time   WBC 9.8 05/19/2019 2245   RBC 5.37 (H) 05/19/2019 2245   HGB 16.8 (H) 05/19/2019 2245   HCT 49.3 (H) 05/19/2019 2245   PLT 297 05/19/2019 2245   MCV 91.8 05/19/2019 2245   MCV 90.3 08/21/2012 2004   MCH 31.3 05/19/2019 2245   MCHC 34.1 05/19/2019 2245   RDW 11.9 05/19/2019 2245   LYMPHSABS 2.2 05/19/2019 2245   MONOABS 0.5 05/19/2019 2245   EOSABS 0.0 05/19/2019 2245   BASOSABS 0.0 05/19/2019 2245    No results found for: "POCLITH", "LITHIUM"   No results found for: "PHENYTOIN", "PHENOBARB", "VALPROATE", "CBMZ"   .res Assessment: Plan:    Plan:  D/C Adderall XR 30mg  daily Add Vyvanse 60mg  daily  Lamictal 150mg  daily Wellbutrin XL 150mg   daily  BP WNL- recently taken at dermatology visit   RTC 4 months  Patient advised to contact office with any questions, adverse effects, or acute worsening in signs and symptoms.  Discussed potential benefits, risks, and side effects of stimulants with patient to include increased heart rate, palpitations, insomnia, increased anxiety, increased irritability, or decreased appetite.  Instructed patient to contact office if experiencing any significant tolerability issues.  Counseled patient regarding potential benefits, risks, and side effects of Lamictal to include potential risk of Stevens-Johnson syndrome. Advised patient to stop taking Lamictal and contact office immediately if rash develops  and to seek urgent medical attention if rash is severe and/or spreading quickly.  Diagnoses and all orders for this visit:  Attention deficit hyperactivity disorder (ADHD), unspecified ADHD type -     lisdexamfetamine (VYVANSE) 60 MG capsule; Take 1 capsule (60 mg total) by mouth every morning.  Major depressive disorder, recurrent episode, moderate (HCC)  Bipolar II disorder (HCC)  Generalized anxiety disorder     Please see After Visit Summary for patient specific instructions.  Future Appointments  Date Time Provider Department Center  02/16/2022  5:20 PM Anya Murphey, Thereasa Solo, NP CP-CP None    No orders of the defined types were placed in this encounter.   -------------------------------

## 2021-12-16 ENCOUNTER — Other Ambulatory Visit (HOSPITAL_COMMUNITY): Payer: Self-pay

## 2021-12-17 DIAGNOSIS — Z13220 Encounter for screening for lipoid disorders: Secondary | ICD-10-CM | POA: Diagnosis not present

## 2021-12-17 DIAGNOSIS — Z713 Dietary counseling and surveillance: Secondary | ICD-10-CM | POA: Diagnosis not present

## 2021-12-17 DIAGNOSIS — Z131 Encounter for screening for diabetes mellitus: Secondary | ICD-10-CM | POA: Diagnosis not present

## 2021-12-17 DIAGNOSIS — Z23 Encounter for immunization: Secondary | ICD-10-CM | POA: Diagnosis not present

## 2021-12-24 ENCOUNTER — Other Ambulatory Visit (HOSPITAL_COMMUNITY): Payer: Self-pay

## 2021-12-25 ENCOUNTER — Other Ambulatory Visit (HOSPITAL_COMMUNITY): Payer: Self-pay

## 2022-01-06 ENCOUNTER — Telehealth: Payer: Self-pay | Admitting: Adult Health

## 2022-01-06 ENCOUNTER — Other Ambulatory Visit (HOSPITAL_COMMUNITY): Payer: Self-pay

## 2022-01-06 ENCOUNTER — Other Ambulatory Visit: Payer: Self-pay

## 2022-01-06 DIAGNOSIS — F909 Attention-deficit hyperactivity disorder, unspecified type: Secondary | ICD-10-CM

## 2022-01-06 MED ORDER — AMPHETAMINE-DEXTROAMPHET ER 30 MG PO CP24
30.0000 mg | ORAL_CAPSULE | Freq: Every day | ORAL | 0 refills | Status: DC
  Filled 2022-01-06: qty 30, 30d supply, fill #0

## 2022-01-06 NOTE — Telephone Encounter (Signed)
Next visit is 03/23/24. Alexis [name] is requesting a refill on her Adderall called to:  [facility]   Phone: [phone]  Fax: [phone]    She is not going to be able to fill the Vyvanse now as it is still too expensive for her.

## 2022-01-06 NOTE — Telephone Encounter (Signed)
Pended.

## 2022-02-01 ENCOUNTER — Other Ambulatory Visit (HOSPITAL_COMMUNITY): Payer: Self-pay

## 2022-02-05 ENCOUNTER — Other Ambulatory Visit (HOSPITAL_COMMUNITY): Payer: Self-pay

## 2022-02-16 ENCOUNTER — Ambulatory Visit: Payer: BC Managed Care – PPO | Admitting: Adult Health

## 2022-03-05 DIAGNOSIS — Z708 Other sex counseling: Secondary | ICD-10-CM | POA: Diagnosis not present

## 2022-03-05 DIAGNOSIS — Z6824 Body mass index (BMI) 24.0-24.9, adult: Secondary | ICD-10-CM | POA: Diagnosis not present

## 2022-03-05 DIAGNOSIS — Z113 Encounter for screening for infections with a predominantly sexual mode of transmission: Secondary | ICD-10-CM | POA: Diagnosis not present

## 2022-03-05 DIAGNOSIS — Z202 Contact with and (suspected) exposure to infections with a predominantly sexual mode of transmission: Secondary | ICD-10-CM | POA: Diagnosis not present

## 2022-03-05 DIAGNOSIS — R030 Elevated blood-pressure reading, without diagnosis of hypertension: Secondary | ICD-10-CM | POA: Diagnosis not present

## 2022-03-16 ENCOUNTER — Other Ambulatory Visit: Payer: Self-pay | Admitting: Adult Health

## 2022-03-16 ENCOUNTER — Telehealth: Payer: Self-pay | Admitting: Adult Health

## 2022-03-16 ENCOUNTER — Other Ambulatory Visit (HOSPITAL_COMMUNITY): Payer: Self-pay

## 2022-03-16 DIAGNOSIS — F909 Attention-deficit hyperactivity disorder, unspecified type: Secondary | ICD-10-CM

## 2022-03-16 MED ORDER — AMPHETAMINE-DEXTROAMPHET ER 30 MG PO CP24
30.0000 mg | ORAL_CAPSULE | Freq: Every day | ORAL | 0 refills | Status: DC
  Filled 2022-03-16: qty 30, 30d supply, fill #0

## 2022-03-16 NOTE — Telephone Encounter (Signed)
Sent!

## 2022-03-16 NOTE — Telephone Encounter (Signed)
Filled 11/22 appt 2/6

## 2022-03-16 NOTE — Telephone Encounter (Signed)
Apt coming up 2/6. Please refill Adderall RX to: [facility]  [address], [place] [address]

## 2022-03-17 ENCOUNTER — Other Ambulatory Visit (HOSPITAL_COMMUNITY): Payer: Self-pay

## 2022-03-23 ENCOUNTER — Ambulatory Visit (INDEPENDENT_AMBULATORY_CARE_PROVIDER_SITE_OTHER): Payer: BC Managed Care – PPO | Admitting: Adult Health

## 2022-03-23 ENCOUNTER — Encounter: Payer: Self-pay | Admitting: Adult Health

## 2022-03-23 ENCOUNTER — Other Ambulatory Visit (HOSPITAL_COMMUNITY): Payer: Self-pay

## 2022-03-23 DIAGNOSIS — F411 Generalized anxiety disorder: Secondary | ICD-10-CM

## 2022-03-23 DIAGNOSIS — F3181 Bipolar II disorder: Secondary | ICD-10-CM

## 2022-03-23 DIAGNOSIS — F331 Major depressive disorder, recurrent, moderate: Secondary | ICD-10-CM

## 2022-03-23 DIAGNOSIS — F909 Attention-deficit hyperactivity disorder, unspecified type: Secondary | ICD-10-CM | POA: Diagnosis not present

## 2022-03-23 MED ORDER — AMPHETAMINE-DEXTROAMPHET ER 30 MG PO CP24
30.0000 mg | ORAL_CAPSULE | Freq: Every day | ORAL | 0 refills | Status: DC
  Filled 2022-06-23: qty 30, 30d supply, fill #0

## 2022-03-23 MED ORDER — AMPHETAMINE-DEXTROAMPHET ER 30 MG PO CP24
30.0000 mg | ORAL_CAPSULE | Freq: Every day | ORAL | 0 refills | Status: DC
  Filled 2022-05-21: qty 30, 30d supply, fill #0

## 2022-03-23 MED ORDER — LAMOTRIGINE 150 MG PO TABS
150.0000 mg | ORAL_TABLET | Freq: Every day | ORAL | 3 refills | Status: DC
  Filled 2022-03-23 – 2022-04-20 (×2): qty 90, 90d supply, fill #0
  Filled 2022-07-26: qty 90, 90d supply, fill #1
  Filled 2022-10-27: qty 90, 90d supply, fill #2

## 2022-03-23 MED ORDER — BUPROPION HCL ER (XL) 150 MG PO TB24
150.0000 mg | ORAL_TABLET | Freq: Every day | ORAL | 3 refills | Status: DC
  Filled 2022-03-23 – 2022-04-20 (×2): qty 90, 90d supply, fill #0
  Filled 2022-07-26: qty 90, 90d supply, fill #1
  Filled 2022-10-27: qty 90, 90d supply, fill #2

## 2022-03-23 MED ORDER — AMPHETAMINE-DEXTROAMPHET ER 30 MG PO CP24
30.0000 mg | ORAL_CAPSULE | Freq: Every day | ORAL | 0 refills | Status: DC
  Filled 2022-03-23 – 2022-04-20 (×2): qty 30, 30d supply, fill #0

## 2022-03-23 NOTE — Progress Notes (Addendum)
GUILLERMINA STANKE [id number] 1990/10/29 32 y.o.  Subjective:   Patient ID:  Alexis [name] is a 32 y.o. (DOB 1991-01-18) female.  Chief Complaint: No chief complaint on file.   HPI Alexis [name] presents to the office today for follow-up of ADHD, GAD, MDD, and BPD 2.   Describes mood today as "ok". Pleasant. Mood symptoms - denies depression, anxiety and irritability. Mood is consistent. Stating "I'm doing alright". Feels like current medication regimen works well - would like to try the generic Vyvanse - has taken previously, but stopped due to cost. Stable interest and motivation. Taking medications as prescribed.  Energy levels stable. Active, has a regular exercise routine.  Enjoys some usual interests and activities. Single. Lives alone. Spending time with family - parents local. Talking with friends.  Appetite adequate. Weight stable - 130 pounds.  Sleeps better some nights than others. Averages 6 to 8 hours. Focus and concentration stable. Completing tasks. Managing aspects of household. 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  Denies AH or VH. Denies self harm. Denies substance use.   [place] Office Visit from 03/26/2021 in [place] at [facility] Total Score 1  PHQ-9 Total Score 5        Review of Systems:  Review of Systems  Musculoskeletal:  Negative for gait problem.  Neurological:  Negative for tremors.  Psychiatric/Behavioral:         Please refer to HPI    Medications: I have reviewed the patient's current medications.  Current Outpatient Medications  Medication Sig Dispense Refill   amphetamine-dextroamphetamine (ADDERALL XR) 30 MG 24 hr capsule Take 1 capsule (30 mg total) by mouth daily. 30 capsule 0   amphetamine-dextroamphetamine (ADDERALL XR) 30 MG 24 hr capsule Take 1 capsule (30 mg total) by mouth daily. 30 capsule 0   amphetamine-dextroamphetamine (ADDERALL XR) 30 MG 24  hr capsule Take 1 capsule (30 mg total) by mouth daily. 30 capsule 0   buPROPion (WELLBUTRIN XL) 150 MG 24 hr tablet Take 1 tablet (150 mg total) by mouth daily. 90 tablet 3   fluocinonide (LIDEX) 0.05 % external solution Apply drops to scalp Externally Twice a day 60 mL 0   lamoTRIgine (LAMICTAL) 150 MG tablet Take 1 tablet (150 mg total) by mouth at bedtime. 90 tablet 3   levonorgestrel (MIRENA) 20 MCG/DAY IUD 1 each by Intrauterine route once.     lisdexamfetamine (VYVANSE) 60 MG capsule Take 1 capsule (60 mg total) by mouth every morning. 30 capsule 0   Multiple Vitamin (MULTI-VITAMIN PO) Take by mouth.     nitrofurantoin, macrocrystal-monohydrate, (MACROBID) 100 MG capsule Take 1 capsule (100 mg total) by mouth 2 (two) times daily. 10 capsule 0   triamcinolone cream (KENALOG) 0.1 % Apply 1 application topically 2 (two) times daily. 45 g 0   No current facility-administered medications for this visit.    Medication Side Effects: None  Allergies:  Allergies  Allergen Reactions   Hornet Venom Anaphylaxis    Past Medical History:  Diagnosis Date   ACNE VULGARIS 04/28/2007   Alcohol abuse, in remission     hx of amnesia    ALCOHOL USE 07/22/2009   Anxiety    ATTENTION DEFICIT DISORDER 04/28/2007    hx of speech therapy   Bipolar disorder ([place])    Depression    EXERCISE INDUCED ASTHMA 04/28/2007   History of pyelonephritis 05/2009   HSV (herpes simplex  virus) anogenital infection     Past Medical History, Surgical history, Social history, and Family history were reviewed and updated as appropriate.   Please see review of systems for further details on the patient's review from today.   Objective:   Physical Exam:  There were no vitals taken for this visit.  Physical Exam Constitutional:      General: She is not in acute distress. Musculoskeletal:        General: No deformity.  Neurological:     Mental Status: She is alert and oriented to person, place, and time.      Coordination: Coordination normal.  Psychiatric:        Attention and Perception: Attention and perception normal. She does not perceive auditory or visual hallucinations.        Mood and Affect: Mood normal. Mood is not anxious or depressed. Affect is not labile, blunt, angry or inappropriate.        Speech: Speech normal.        Behavior: Behavior normal.        Thought Content: Thought content normal. Thought content is not paranoid or delusional. Thought content does not include homicidal or suicidal ideation. Thought content does not include homicidal or suicidal plan.        Cognition and Memory: Cognition and memory normal.        Judgment: Judgment normal.     Comments: Insight intact     Lab Review:     Component Value Date/Time   NA 143 05/19/2019 2245   K 4.0 05/19/2019 2245   CL 109 05/19/2019 2245   CO2 21 (L) 05/19/2019 2245   GLUCOSE 92 05/19/2019 2245   BUN 10 05/19/2019 2245   CREATININE 0.50 05/19/2019 2245   CALCIUM 9.2 05/19/2019 2245   PROT 8.6 (H) 05/19/2019 2245   ALBUMIN 4.6 05/19/2019 2245   AST 18 05/19/2019 2245   ALT 19 05/19/2019 2245   ALKPHOS 80 05/19/2019 2245   BILITOT 0.4 05/19/2019 2245   GFRNONAA >60 05/19/2019 2245   GFRAA >60 05/19/2019 2245       Component Value Date/Time   WBC 9.8 05/19/2019 2245   RBC 5.37 (H) 05/19/2019 2245   HGB 16.8 (H) 05/19/2019 2245   HCT 49.3 (H) 05/19/2019 2245   PLT 297 05/19/2019 2245   MCV 91.8 05/19/2019 2245   MCV 90.3 08/21/2012 2004   MCH 31.3 05/19/2019 2245   MCHC 34.1 05/19/2019 2245   RDW 11.9 05/19/2019 2245   LYMPHSABS 2.2 05/19/2019 2245   MONOABS 0.5 05/19/2019 2245   EOSABS 0.0 05/19/2019 2245   BASOSABS 0.0 05/19/2019 2245    No results found for: "POCLITH", "LITHIUM"   No results found for: "PHENYTOIN", "PHENOBARB", "VALPROATE", "CBMZ"   .res Assessment: Plan:    Plan:  Add Adderall XR 97m daily D/C Vyvanse 662mdaily  Lamictal 1504maily Wellbutrin XL 150m39maily  Monitor BP between visits while taking stimulant medication.   RTC 4 months  Patient advised to contact office with any questions, adverse effects, or acute worsening in signs and symptoms.  Discussed potential benefits, risks, and side effects of stimulants with patient to include increased heart rate, palpitations, insomnia, increased anxiety, increased irritability, or decreased appetite.  Instructed patient to contact office if experiencing any significant tolerability issues.  Counseled patient regarding potential benefits, risks, and side effects of Lamictal to include potential risk of Stevens-Johnson syndrome. Advised patient to stop taking Lamictal and contact office immediately if  rash develops and to seek urgent medical attention if rash is severe and/or spreading quickly.  There are no diagnoses linked to this encounter.   Please see After Visit Summary for patient specific instructions.  No future appointments.  No orders of the defined types were placed in this encounter.   -------------------------------

## 2022-03-24 ENCOUNTER — Other Ambulatory Visit (HOSPITAL_COMMUNITY): Payer: Self-pay

## 2022-03-31 NOTE — Addendum Note (Signed)
Addended by: Aloha Gell on: 03/31/2022 03:22 PM   Modules accepted: Level of Service

## 2022-04-13 ENCOUNTER — Other Ambulatory Visit (HOSPITAL_COMMUNITY): Payer: Self-pay

## 2022-04-20 ENCOUNTER — Other Ambulatory Visit (HOSPITAL_COMMUNITY): Payer: Self-pay

## 2022-04-20 MED ORDER — TRIAMCINOLONE ACETONIDE 0.1 % EX CREA
1.0000 | TOPICAL_CREAM | Freq: Two times a day (BID) | CUTANEOUS | 0 refills | Status: DC
  Filled 2022-04-20: qty 45, 15d supply, fill #0

## 2022-05-21 ENCOUNTER — Other Ambulatory Visit (HOSPITAL_COMMUNITY): Payer: Self-pay

## 2022-06-23 ENCOUNTER — Other Ambulatory Visit (HOSPITAL_COMMUNITY): Payer: Self-pay

## 2022-07-22 ENCOUNTER — Ambulatory Visit (INDEPENDENT_AMBULATORY_CARE_PROVIDER_SITE_OTHER): Payer: BC Managed Care – PPO | Admitting: Adult Health

## 2022-07-22 ENCOUNTER — Encounter: Payer: Self-pay | Admitting: Adult Health

## 2022-07-22 ENCOUNTER — Other Ambulatory Visit (HOSPITAL_COMMUNITY): Payer: Self-pay

## 2022-07-22 DIAGNOSIS — F3181 Bipolar II disorder: Secondary | ICD-10-CM

## 2022-07-22 DIAGNOSIS — F909 Attention-deficit hyperactivity disorder, unspecified type: Secondary | ICD-10-CM

## 2022-07-22 DIAGNOSIS — F411 Generalized anxiety disorder: Secondary | ICD-10-CM | POA: Diagnosis not present

## 2022-07-22 MED ORDER — AMPHETAMINE-DEXTROAMPHET ER 30 MG PO CP24
30.0000 mg | ORAL_CAPSULE | Freq: Every day | ORAL | 0 refills | Status: DC
  Filled 2022-08-25: qty 30, 30d supply, fill #0

## 2022-07-22 MED ORDER — AMPHETAMINE-DEXTROAMPHET ER 30 MG PO CP24
30.0000 mg | ORAL_CAPSULE | Freq: Every day | ORAL | 0 refills | Status: DC
  Filled 2022-07-22: qty 30, 30d supply, fill #0

## 2022-07-22 MED ORDER — AMPHETAMINE-DEXTROAMPHET ER 30 MG PO CP24
30.0000 mg | ORAL_CAPSULE | Freq: Every day | ORAL | 0 refills | Status: DC
  Filled 2022-09-27: qty 30, 30d supply, fill #0

## 2022-07-22 NOTE — Progress Notes (Signed)
Alexis [name] [id number] 11/05/1990 32 y.o.  Subjective:   Patient ID:  Alexis [name] is a 32 y.o. (DOB Aug 13, 1990) female.  Chief Complaint: No chief complaint on file.   HPI Alexis [name] presents to the office today for follow-up of ADHD, GAD, MDD, and BPD 2.   Describes mood today as "ok". Pleasant. Mood symptoms - reports some depression, anxiety and irritability - more situational. Denies panic attacks. Reports some worry, rumination, and over thinking. Mood is consistent. Stating "I feel like I'm doing ok". Feels like current medication regimen works well. Stable interest and motivation. Taking medications as prescribed.  Energy levels stable. Active, has a regular exercise routine.  Enjoys some usual interests and activities. Single. Lives alone. Spending time with family - parents local. Talking with friends.  Appetite adequate. Weight stable - 135 pounds.  Sleeps better some nights than others. Averages 6 to 8 hours. Focus and concentration stable. Completing tasks. Managing aspects of household. 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  Denies AH or VH. Denies self harm. Denies substance use.   PHQ2-9    Flowsheet Row Office Visit from 03/26/2021 in [facility] Total Score 1  PHQ-9 Total Score 5        Review of Systems:  Review of Systems  Musculoskeletal:  Negative for gait problem.  Neurological:  Negative for tremors.  Psychiatric/Behavioral:         Please refer to HPI    Medications: I have reviewed the patient's current medications.  Current Outpatient Medications  Medication Sig Dispense Refill   amphetamine-dextroamphetamine (ADDERALL XR) 30 MG 24 hr capsule Take 1 capsule (30 mg total) by mouth daily. 30 capsule 0   amphetamine-dextroamphetamine (ADDERALL XR) 30 MG 24 hr capsule Take 1 capsule (30 mg total) by mouth daily. 30 capsule 0   amphetamine-dextroamphetamine  (ADDERALL XR) 30 MG 24 hr capsule Take 1 capsule (30 mg total) by mouth daily. 30 capsule 0   buPROPion (WELLBUTRIN XL) 150 MG 24 hr tablet Take 1 tablet (150 mg total) by mouth daily. 90 tablet 3   fluocinonide (LIDEX) 0.05 % external solution Apply drops to scalp Externally Twice a day 60 mL 0   lamoTRIgine (LAMICTAL) 150 MG tablet Take 1 tablet (150 mg total) by mouth at bedtime. 90 tablet 3   levonorgestrel (MIRENA) 20 MCG/DAY IUD 1 each by Intrauterine route once.     Multiple Vitamin (MULTI-VITAMIN PO) Take by mouth.     nitrofurantoin, macrocrystal-monohydrate, (MACROBID) 100 MG capsule Take 1 capsule (100 mg total) by mouth 2 (two) times daily. 10 capsule 0   triamcinolone cream (KENALOG) 0.1 % Apply 1 Application topically 2 (two) times daily. 45 g 0   No current facility-administered medications for this visit.    Medication Side Effects: None  Allergies:  Allergies  Allergen Reactions   Hornet Venom Anaphylaxis    Past Medical History:  Diagnosis Date   ACNE VULGARIS 04/28/2007   Alcohol abuse, in remission     hx of amnesia    ALCOHOL USE 07/22/2009   Anxiety    ATTENTION DEFICIT DISORDER 04/28/2007    hx of speech therapy   Bipolar disorder ([facility])    Depression    EXERCISE INDUCED ASTHMA 04/28/2007   History of pyelonephritis 05/2009   HSV (herpes simplex virus) anogenital infection     Past Medical History, Surgical history, Social history, and Family history were reviewed and  updated as appropriate.   Please see review of systems for further details on the patient's review from today.   Objective:   Physical Exam:  There were no vitals taken for this visit.  Physical Exam Constitutional:      General: She is not in acute distress. Musculoskeletal:        General: No deformity.  Neurological:     Mental Status: She is alert and oriented to person, place, and time.     Coordination: Coordination normal.  Psychiatric:        Attention and Perception:  Attention and perception normal. She does not perceive auditory or visual hallucinations.        Mood and Affect: Mood normal. Mood is not anxious or depressed. Affect is not labile, blunt, angry or inappropriate.        Speech: Speech normal.        Behavior: Behavior normal.        Thought Content: Thought content normal. Thought content is not paranoid or delusional. Thought content does not include homicidal or suicidal ideation. Thought content does not include homicidal or suicidal plan.        Cognition and Memory: Cognition and memory normal.        Judgment: Judgment normal.     Comments: Insight intact     Lab Review:     Component Value Date/Time   NA 143 05/19/2019 2245   K 4.0 05/19/2019 2245   CL 109 05/19/2019 2245   CO2 21 (L) 05/19/2019 2245   GLUCOSE 92 05/19/2019 2245   BUN 10 05/19/2019 2245   CREATININE 0.50 05/19/2019 2245   CALCIUM 9.2 05/19/2019 2245   PROT 8.6 (H) 05/19/2019 2245   ALBUMIN 4.6 05/19/2019 2245   AST 18 05/19/2019 2245   ALT 19 05/19/2019 2245   ALKPHOS 80 05/19/2019 2245   BILITOT 0.4 05/19/2019 2245   GFRNONAA >60 05/19/2019 2245   GFRAA >60 05/19/2019 2245       Component Value Date/Time   WBC 9.8 05/19/2019 2245   RBC 5.37 (H) 05/19/2019 2245   HGB 16.8 (H) 05/19/2019 2245   HCT 49.3 (H) 05/19/2019 2245   PLT 297 05/19/2019 2245   MCV 91.8 05/19/2019 2245   MCV 90.3 08/21/2012 2004   MCH 31.3 05/19/2019 2245   MCHC 34.1 05/19/2019 2245   RDW 11.9 05/19/2019 2245   LYMPHSABS 2.2 05/19/2019 2245   MONOABS 0.5 05/19/2019 2245   EOSABS 0.0 05/19/2019 2245   BASOSABS 0.0 05/19/2019 2245    No results found for: "POCLITH", "LITHIUM"   No results found for: "PHENYTOIN", "PHENOBARB", "VALPROATE", "CBMZ"   .res Assessment: Plan:    Plan:  Adderall XR 30mg  daily  Lamictal 150mg  daily Wellbutrin XL 150mg  daily  Monitor BP between visits while taking stimulant medication.   RTC 4 months  Patient advised to contact  office with any questions, adverse effects, or acute worsening in signs and symptoms.  Discussed potential benefits, risks, and side effects of stimulants with patient to include increased heart rate, palpitations, insomnia, increased anxiety, increased irritability, or decreased appetite.  Instructed patient to contact office if experiencing any significant tolerability issues.  Counseled patient regarding potential benefits, risks, and side effects of Lamictal to include potential risk of Stevens-Johnson syndrome. Advised patient to stop taking Lamictal and contact office immediately if rash develops and to seek urgent medical attention if rash is severe and/or spreading quickly.  There are no diagnoses linked to this encounter.  Please see After Visit Summary for patient specific instructions.  Future Appointments  Date Time Provider Department Center  07/22/2022  5:20 PM Di Jasmer, Thereasa Solo, NP [facility] None  08/20/2022  9:55 AM Jerene Bears, MD [facility]    No orders of the defined types were placed in this encounter.   -------------------------------

## 2022-07-23 ENCOUNTER — Other Ambulatory Visit (HOSPITAL_COMMUNITY): Payer: Self-pay

## 2022-07-26 ENCOUNTER — Other Ambulatory Visit (HOSPITAL_COMMUNITY): Payer: Self-pay

## 2022-08-16 ENCOUNTER — Encounter (HOSPITAL_BASED_OUTPATIENT_CLINIC_OR_DEPARTMENT_OTHER): Payer: Self-pay

## 2022-08-16 NOTE — Progress Notes (Signed)
Called patient to go over allergies, medications and medical history. All areas have been updated. tbw

## 2022-08-20 ENCOUNTER — Encounter (HOSPITAL_BASED_OUTPATIENT_CLINIC_OR_DEPARTMENT_OTHER): Payer: Self-pay | Admitting: Obstetrics & Gynecology

## 2022-08-20 ENCOUNTER — Ambulatory Visit (INDEPENDENT_AMBULATORY_CARE_PROVIDER_SITE_OTHER): Payer: BC Managed Care – PPO | Admitting: Obstetrics & Gynecology

## 2022-08-20 ENCOUNTER — Other Ambulatory Visit (HOSPITAL_COMMUNITY)
Admission: RE | Admit: 2022-08-20 | Discharge: 2022-08-20 | Disposition: A | Discharge status: Home / Self Care | Payer: BC Managed Care – PPO | Source: Ambulatory Visit | Attending: Obstetrics & Gynecology | Admitting: Obstetrics & Gynecology

## 2022-08-20 VITALS — BP 140/88 | HR 82 | Ht 64.0 in | Wt 141.0 lb

## 2022-08-20 DIAGNOSIS — Z975 Presence of (intrauterine) contraceptive device: Secondary | ICD-10-CM | POA: Diagnosis not present

## 2022-08-20 DIAGNOSIS — Z124 Encounter for screening for malignant neoplasm of cervix: Secondary | ICD-10-CM | POA: Insufficient documentation

## 2022-08-20 DIAGNOSIS — Z01419 Encounter for gynecological examination (general) (routine) without abnormal findings: Secondary | ICD-10-CM

## 2022-08-20 NOTE — Progress Notes (Signed)
32 y.o. G0P0000 Single White or Caucasian female here for annual exam.  Was last seen in 2020.  Has Rutha Bouchard that was 11/2018.  Due for removal and replacement next year.  She has very little bleeding with this IUD.  Is going to want to continue with using a Kyleena IUD.   Had STD testing in January.  Reviewed.  All negative.  Does have new partner.  Has some questions about freezing eggs.  Discussed today.  No LMP recorded. (Menstrual status: IUD).          Sexually active: Yes.    The current method of family planning is IUD.     Upstream - 08/20/22 0951       Pregnancy Intention Screening   Does the patient want to become pregnant in the next year? No    Does the patient's partner want to become pregnant in the next year? No    Would the patient like to discuss contraceptive options today? No      Contraception Wrap Up   Current Method IUD or IUS            Exercising: Yes.     Smoker:  quit this week  Health Maintenance: Pap:  10/09/18 History of abnormal Pap:  no MMG:  guidelines reviewed Colonoscopy:  guidelines reviewed Screening Labs: has done with health fair at work   reports that she quit smoking 4 days ago. Her smoking use included cigarettes. She smoked an average of .25 packs per day. She has never used smokeless tobacco. She reports current alcohol use of about 14.0 - 20.0 standard drinks of alcohol per week. She reports that she does not use drugs.  Past Medical History:  Diagnosis Date   ACNE VULGARIS 04/28/2007   Alcohol abuse, in remission     hx of amnesia    ALCOHOL USE 07/22/2009   Anxiety    ATTENTION DEFICIT DISORDER 04/28/2007    hx of speech therapy   Bipolar disorder ([facility])    Depression    EXERCISE INDUCED ASTHMA 04/28/2007   History of pyelonephritis 05/2009   HSV (herpes simplex virus) anogenital infection     History reviewed. No pertinent surgical history.  Current Outpatient Medications  Medication Sig Dispense Refill    amphetamine-dextroamphetamine (ADDERALL XR) 30 MG 24 hr capsule Take 1 capsule (30 mg total) by mouth daily. 30 capsule 0   buPROPion (WELLBUTRIN XL) 150 MG 24 hr tablet Take 1 tablet (150 mg total) by mouth daily. 90 tablet 3   lamoTRIgine (LAMICTAL) 150 MG tablet Take 1 tablet (150 mg total) by mouth at bedtime. 90 tablet 3   levonorgestrel (MIRENA) 20 MCG/DAY IUD 1 each by Intrauterine route once.     Multiple Vitamin (MULTI-VITAMIN PO) Take by mouth.     nicotine polacrilex (NICORETTE) 2 MG gum Take 2 mg by mouth as needed for smoking cessation.     triamcinolone cream (KENALOG) 0.1 % Apply 1 Application topically 2 (two) times daily. 45 g 0   amphetamine-dextroamphetamine (ADDERALL XR) 30 MG 24 hr capsule Take 1 capsule (30 mg total) by mouth daily. (Patient not taking: Reported on 08/20/2022) 30 capsule 0   [START ON 09/16/2022] amphetamine-dextroamphetamine (ADDERALL XR) 30 MG 24 hr capsule Take 1 capsule (30 mg total) by mouth daily. (Patient not taking: Reported on 08/20/2022) 30 capsule 0   No current facility-administered medications for this visit.    Family History  Problem Relation Age of Onset   Heart attack Mother  Asthma Mother    Alcohol abuse Paternal Grandmother    Depression Paternal Grandmother    Diabetes type II Maternal Grandmother    Dementia Maternal Grandfather    Stroke Paternal Grandfather     ROS: Constitutional: negative Genitourinary:negative  Exam:   BP (!) 177/104   Pulse 82   Ht 5\' 4"  (1.626 m)   Wt 141 lb (64 kg)   BMI 24.20 kg/m   Height: 5\' 4"  (162.6 cm)  General appearance: alert, cooperative and appears stated age Head: Normocephalic, without obvious abnormality, atraumatic Neck: no adenopathy, supple, symmetrical, trachea midline and thyroid normal to inspection and palpation Lungs: clear to auscultation bilaterally Breasts: normal appearance, no masses or tenderness Heart: regular rate and rhythm Abdomen: soft, non-tender; bowel sounds  normal; no masses,  no organomegaly Extremities: extremities normal, atraumatic, no cyanosis or edema Skin: Skin color, texture, turgor normal. No rashes or lesions Lymph nodes: Cervical, supraclavicular, and axillary nodes normal. No abnormal inguinal nodes palpated Neurologic: Grossly normal   Pelvic: External genitalia:  no lesions              Urethra:  normal appearing urethra with no masses, tenderness or lesions              Bartholins and Skenes: normal                 Vagina: normal appearing vagina with normal color and no discharge, no lesions              Cervix: no lesions              Pap taken: Yes.   Bimanual Exam:  Uterus:  normal size, contour, position, consistency, mobility, non-tender              Adnexa: normal adnexa and no mass, fullness, tenderness               Rectovaginal: Confirms               Anus:  normal sphincter tone, no lesions  Chaperone, Raechel Ache, RN, was present for exam.  Assessment/Plan: 1. Well woman exam with routine gynecological exam - Pap smear and HR HPV obtained today - Mammogram screening guidelines discussed - Colonoscopy screening guidelines discussed - lab work done with her job - vaccines reviewed  2. Cervical cancer screening - Cytology - PAP( )  3. Uses hormone releasing intrauterine device (IUD) for contraception - pt aware due for removal by 11/2023.  Currently, she does want a new one replaced at that time.

## 2022-08-24 LAB — CYTOLOGY - PAP
Comment: NEGATIVE
Comment: NEGATIVE
Comment: NEGATIVE
Diagnosis: NEGATIVE
HPV 16: NEGATIVE
HPV 18 / 45: NEGATIVE
High risk HPV: POSITIVE — AB

## 2022-08-25 ENCOUNTER — Other Ambulatory Visit (HOSPITAL_COMMUNITY): Payer: Self-pay

## 2022-09-27 ENCOUNTER — Other Ambulatory Visit (HOSPITAL_COMMUNITY): Payer: Self-pay

## 2022-09-29 ENCOUNTER — Other Ambulatory Visit (HOSPITAL_COMMUNITY): Payer: Self-pay

## 2022-09-30 ENCOUNTER — Other Ambulatory Visit (HOSPITAL_COMMUNITY): Payer: Self-pay

## 2022-09-30 MED ORDER — TRIAMCINOLONE ACETONIDE 0.1 % EX CREA
TOPICAL_CREAM | Freq: Two times a day (BID) | CUTANEOUS | 0 refills | Status: AC
  Filled 2022-09-30: qty 45, 30d supply, fill #0

## 2022-10-27 ENCOUNTER — Other Ambulatory Visit (HOSPITAL_COMMUNITY): Payer: Self-pay

## 2022-10-27 ENCOUNTER — Other Ambulatory Visit: Payer: Self-pay

## 2022-10-27 ENCOUNTER — Telehealth: Payer: Self-pay | Admitting: Adult Health

## 2022-10-27 DIAGNOSIS — F909 Attention-deficit hyperactivity disorder, unspecified type: Secondary | ICD-10-CM

## 2022-10-27 NOTE — Telephone Encounter (Signed)
Pt called for refill of Adderall to [facility] on [address]  Pt is out of meds.  Next appt 10/7

## 2022-10-27 NOTE — Telephone Encounter (Signed)
Patient called regarding medication. She is calling pharmacy to make sure the Adderall is still in stock. Ph: [phone]

## 2022-10-27 NOTE — Telephone Encounter (Signed)
Pended.

## 2022-10-28 ENCOUNTER — Other Ambulatory Visit (HOSPITAL_COMMUNITY): Payer: Self-pay

## 2022-10-28 MED ORDER — AMPHETAMINE-DEXTROAMPHET ER 30 MG PO CP24
30.0000 mg | ORAL_CAPSULE | Freq: Every day | ORAL | 0 refills | Status: DC
Start: 2022-10-28 — End: 2022-11-25
  Filled 2022-10-28: qty 30, 30d supply, fill #0

## 2022-11-03 ENCOUNTER — Ambulatory Visit (INDEPENDENT_AMBULATORY_CARE_PROVIDER_SITE_OTHER): Payer: BC Managed Care – PPO | Admitting: Family Medicine

## 2022-11-03 ENCOUNTER — Encounter: Payer: Self-pay | Admitting: Family Medicine

## 2022-11-03 ENCOUNTER — Other Ambulatory Visit (HOSPITAL_COMMUNITY): Payer: Self-pay

## 2022-11-03 DIAGNOSIS — R8290 Unspecified abnormal findings in urine: Secondary | ICD-10-CM | POA: Diagnosis not present

## 2022-11-03 DIAGNOSIS — N760 Acute vaginitis: Secondary | ICD-10-CM

## 2022-11-03 DIAGNOSIS — B9689 Other specified bacterial agents as the cause of diseases classified elsewhere: Secondary | ICD-10-CM

## 2022-11-03 LAB — POC URINALSYSI DIPSTICK (AUTOMATED)
Bilirubin, UA: NEGATIVE
Blood, UA: NEGATIVE
Glucose, UA: NEGATIVE
Leukocytes, UA: NEGATIVE
Nitrite, UA: NEGATIVE
Protein, UA: POSITIVE — AB
Spec Grav, UA: 1.025 (ref 1.010–1.025)
Urobilinogen, UA: 1 U/dL
pH, UA: 6 (ref 5.0–8.0)

## 2022-11-03 MED ORDER — METRONIDAZOLE 500 MG PO TABS
500.0000 mg | ORAL_TABLET | Freq: Three times a day (TID) | ORAL | 0 refills | Status: DC
  Filled 2022-11-03: qty 21, 7d supply, fill #0

## 2022-11-03 NOTE — Addendum Note (Signed)
Addended by: Carola Rhine on: 11/03/2022 04:47 PM   Modules accepted: Orders

## 2022-11-03 NOTE — Addendum Note (Signed)
Addended by: Carola Rhine on: 11/03/2022 03:27 PM   Modules accepted: Orders

## 2022-11-03 NOTE — Progress Notes (Signed)
Subjective:    Patient ID: Alexis [name], female    DOB: 1991/02/15, 32 y.o.   MRN: [id number]  HPI Here for lower abdominal pressure and a vaginal DC that has a foul odor. No itching. No fever or nausea. She thinks this is a urine infection, but she denies any urgency or burning or back pain. UA today is clear. She does not have menses because she has an IUD. BM's are regular.    Review of Systems  Constitutional: Negative.   Respiratory: Negative.    Cardiovascular: Negative.   Gastrointestinal:  Positive for abdominal pain. Negative for abdominal distention, blood in stool, constipation, diarrhea, nausea and vomiting.  Genitourinary:  Positive for vaginal discharge. Negative for dysuria, flank pain, hematuria and urgency.       Objective:   Physical Exam Constitutional:      Appearance: Normal appearance. She is not ill-appearing.  Cardiovascular:     Rate and Rhythm: Normal rate and regular rhythm.     Pulses: Normal pulses.     Heart sounds: Normal heart sounds.  Pulmonary:     Effort: Pulmonary effort is normal.     Breath sounds: Normal breath sounds.  Abdominal:     General: Abdomen is flat. Bowel sounds are normal. There is no distension.     Palpations: Abdomen is soft. There is no mass.     Tenderness: There is no right CVA tenderness, left CVA tenderness, guarding or rebound.     Hernia: No hernia is present.     Comments: Mild suprapubic tenderness   Neurological:     Mental Status: She is alert.           Assessment & Plan:  This is consistent with bacterial vaginitis. Treat with 7 days of Metronidazole. Culture the urine sample.  Gershon Crane, MD

## 2022-11-04 LAB — URINE CULTURE
MICRO NUMBER:: 15484721
Result:: NO GROWTH
SPECIMEN QUALITY:: ADEQUATE

## 2022-11-22 ENCOUNTER — Ambulatory Visit: Payer: BC Managed Care – PPO | Admitting: Adult Health

## 2022-11-25 ENCOUNTER — Encounter: Payer: Self-pay | Admitting: Adult Health

## 2022-11-25 ENCOUNTER — Ambulatory Visit (INDEPENDENT_AMBULATORY_CARE_PROVIDER_SITE_OTHER): Payer: BC Managed Care – PPO | Admitting: Adult Health

## 2022-11-25 ENCOUNTER — Other Ambulatory Visit (HOSPITAL_COMMUNITY): Payer: Self-pay

## 2022-11-25 DIAGNOSIS — F3181 Bipolar II disorder: Secondary | ICD-10-CM | POA: Diagnosis not present

## 2022-11-25 DIAGNOSIS — F411 Generalized anxiety disorder: Secondary | ICD-10-CM | POA: Diagnosis not present

## 2022-11-25 DIAGNOSIS — F909 Attention-deficit hyperactivity disorder, unspecified type: Secondary | ICD-10-CM

## 2022-11-25 MED ORDER — AMPHETAMINE-DEXTROAMPHET ER 30 MG PO CP24
30.0000 mg | ORAL_CAPSULE | Freq: Every day | ORAL | 0 refills | Status: DC
Start: 2022-12-23 — End: 2023-03-28
  Filled 2022-12-30: qty 30, 30d supply, fill #0

## 2022-11-25 MED ORDER — AMPHETAMINE-DEXTROAMPHET ER 30 MG PO CP24
30.0000 mg | ORAL_CAPSULE | Freq: Every day | ORAL | 0 refills | Status: DC
Start: 2023-01-20 — End: 2023-03-08
  Filled 2023-02-01: qty 30, 30d supply, fill #0

## 2022-11-25 MED ORDER — BUPROPION HCL ER (XL) 150 MG PO TB24
150.0000 mg | ORAL_TABLET | Freq: Every day | ORAL | 3 refills | Status: DC
  Filled 2022-11-25 – 2023-11-02 (×2): qty 90, 90d supply, fill #0

## 2022-11-25 MED ORDER — LAMOTRIGINE 150 MG PO TABS
150.0000 mg | ORAL_TABLET | Freq: Every day | ORAL | 3 refills | Status: DC
  Filled 2022-11-25 – 2023-11-02 (×2): qty 90, 90d supply, fill #0

## 2022-11-25 MED ORDER — AMPHETAMINE-DEXTROAMPHET ER 30 MG PO CP24
30.0000 mg | ORAL_CAPSULE | Freq: Every day | ORAL | 0 refills | Status: DC
Start: 2022-11-25 — End: 2023-03-28
  Filled 2022-11-25: qty 30, 30d supply, fill #0

## 2022-11-25 NOTE — Progress Notes (Signed)
Alexis [name] [id number] 08-Jun-1990 32 y.o.  Subjective:   Patient ID:  Alexis [name] is a 32 y.o. (DOB 1991-01-22) female.  Chief Complaint: No chief complaint on file.   HPI BRENTLEY HORRELL presents to the office today for follow-up of ADHD, GAD, and BPD 2.   Describes mood today as "ok". Pleasant. Mood symptoms - reports some depression, anxiety and irritability - "situational". Has quit smoking over the past month and is "struggling". Denies panic attacks. Reports some worry, rumination, and over thinking - concerns about the election. Mood is variable. Stating "overall, I feel like I'm doing ok". Feels like current medication regimen works well. Stable interest and motivation. Taking medications as prescribed.  Energy levels stable. Active, has a regular exercise routine.  Enjoys some usual interests and activities. Single. Dating. Lives alone. Spending time with family - parents local. Talking with friends.  Appetite adequate. Weight gain - 135 to 145 pounds.  Sleeps better some nights than others. Averages 6 to 8 hours. Focus and concentration stable. Completing tasks. Managing aspects of household. 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  Denies AH or VH. Denies self harm. Denies substance use.   AUDIT    Flowsheet Row Office Visit from 11/03/2022 in [facility] [place]  Alcohol Use Disorder Identification Test Final Score (AUDIT) 11       GAD-7    Flowsheet Row Office Visit from 11/03/2022 in [facility] [place]  Total GAD-7 Score 5      PHQ2-9    Flowsheet Row Office Visit from 11/03/2022 in [facility] [place] Office Visit from 08/20/2022 in [facility] [place] Office Visit from 03/26/2021 in [facility] [place]  PHQ-2 Total Score 1 0 1  PHQ-9 Total Score 7 -- 5        Review of Systems:   Review of Systems  Musculoskeletal:  Negative for gait problem.  Neurological:  Negative for tremors.  Psychiatric/Behavioral:         Please refer to HPI    Medications: I have reviewed the patient's current medications.  Current Outpatient Medications  Medication Sig Dispense Refill   amphetamine-dextroamphetamine (ADDERALL XR) 30 MG 24 hr capsule Take 1 capsule (30 mg total) by mouth daily. 30 capsule 0   [START ON 12/23/2022] amphetamine-dextroamphetamine (ADDERALL XR) 30 MG 24 hr capsule Take 1 capsule (30 mg total) by mouth daily. 30 capsule 0   [START ON 01/20/2023] amphetamine-dextroamphetamine (ADDERALL XR) 30 MG 24 hr capsule Take 1 capsule (30 mg total) by mouth daily. 30 capsule 0   buPROPion (WELLBUTRIN XL) 150 MG 24 hr tablet Take 1 tablet (150 mg total) by mouth daily. 90 tablet 3   lamoTRIgine (LAMICTAL) 150 MG tablet Take 1 tablet (150 mg total) by mouth at bedtime. 90 tablet 3   metroNIDAZOLE (FLAGYL) 500 MG tablet Take 1 tablet (500 mg total) by mouth 3 (three) times daily. 21 tablet 0   Multiple Vitamin (MULTI-VITAMIN PO) Take by mouth.     nicotine polacrilex (NICORETTE) 2 MG gum Take 2 mg by mouth as needed for smoking cessation.     triamcinolone cream (KENALOG) 0.1 % Apply 1 Application topically 2 (two) times daily. 45 g 0   No current facility-administered medications for this visit.    Medication Side Effects: None  Allergies:  Allergies  Allergen Reactions   Hornet Venom Anaphylaxis  Past Medical History:  Diagnosis Date   ACNE VULGARIS 04/28/2007   Alcohol abuse, in remission     hx of amnesia    ALCOHOL USE 07/22/2009   Anxiety    ATTENTION DEFICIT DISORDER 04/28/2007    hx of speech therapy   Bipolar disorder (HCC)    Depression    EXERCISE INDUCED ASTHMA 04/28/2007   History of pyelonephritis 05/2009   HSV (herpes simplex virus) anogenital infection     Past Medical History, Surgical history, Social history, and Family history were reviewed  and updated as appropriate.   Please see review of systems for further details on the patient's review from today.   Objective:   Physical Exam:  There were no vitals taken for this visit.  Physical Exam Constitutional:      General: She is not in acute distress. Musculoskeletal:        General: No deformity.  Neurological:     Mental Status: She is alert and oriented to person, place, and time.     Coordination: Coordination normal.  Psychiatric:        Attention and Perception: Attention and perception normal. She does not perceive auditory or visual hallucinations.        Mood and Affect: Mood normal. Mood is not anxious or depressed. Affect is not labile, blunt, angry or inappropriate.        Speech: Speech normal.        Behavior: Behavior normal.        Thought Content: Thought content normal. Thought content is not paranoid or delusional. Thought content does not include homicidal or suicidal ideation. Thought content does not include homicidal or suicidal plan.        Cognition and Memory: Cognition and memory normal.        Judgment: Judgment normal.     Comments: Insight intact     Lab Review:     Component Value Date/Time   NA 143 05/19/2019 2245   K 4.0 05/19/2019 2245   CL 109 05/19/2019 2245   CO2 21 (L) 05/19/2019 2245   GLUCOSE 92 05/19/2019 2245   BUN 10 05/19/2019 2245   CREATININE 0.50 05/19/2019 2245   CALCIUM 9.2 05/19/2019 2245   PROT 8.6 (H) 05/19/2019 2245   ALBUMIN 4.6 05/19/2019 2245   AST 18 05/19/2019 2245   ALT 19 05/19/2019 2245   ALKPHOS 80 05/19/2019 2245   BILITOT 0.4 05/19/2019 2245   GFRNONAA >60 05/19/2019 2245   GFRAA >60 05/19/2019 2245       Component Value Date/Time   WBC 9.8 05/19/2019 2245   RBC 5.37 (H) 05/19/2019 2245   HGB 16.8 (H) 05/19/2019 2245   HCT 49.3 (H) 05/19/2019 2245   PLT 297 05/19/2019 2245   MCV 91.8 05/19/2019 2245   MCV 90.3 08/21/2012 2004   MCH 31.3 05/19/2019 2245   MCHC 34.1 05/19/2019 2245    RDW 11.9 05/19/2019 2245   LYMPHSABS 2.2 05/19/2019 2245   MONOABS 0.5 05/19/2019 2245   EOSABS 0.0 05/19/2019 2245   BASOSABS 0.0 05/19/2019 2245    No results found for: "POCLITH", "LITHIUM"   No results found for: "PHENYTOIN", "PHENOBARB", "VALPROATE", "CBMZ"   .res Assessment: Plan:    Plan:  Adderall XR 30mg  daily  Lamictal 150mg  daily Wellbutrin XL 150mg  daily  Monitor BP between visits while taking stimulant medication.   RTC 4 months  Patient advised to contact office with any questions, adverse effects, or acute worsening in signs and  symptoms.  Discussed potential benefits, risks, and side effects of stimulants with patient to include increased heart rate, palpitations, insomnia, increased anxiety, increased irritability, or decreased appetite.  Instructed patient to contact office if experiencing any significant tolerability issues.  Counseled patient regarding potential benefits, risks, and side effects of Lamictal to include potential risk of Stevens-Johnson syndrome. Advised patient to stop taking Lamictal and contact office immediately if rash develops and to seek urgent medical attention if rash is severe and/or spreading quickly.  Diagnoses and all orders for this visit:  Attention deficit hyperactivity disorder (ADHD), unspecified ADHD type -     amphetamine-dextroamphetamine (ADDERALL XR) 30 MG 24 hr capsule; Take 1 capsule (30 mg total) by mouth daily. -     amphetamine-dextroamphetamine (ADDERALL XR) 30 MG 24 hr capsule; Take 1 capsule (30 mg total) by mouth daily. -     amphetamine-dextroamphetamine (ADDERALL XR) 30 MG 24 hr capsule; Take 1 capsule (30 mg total) by mouth daily.  Generalized anxiety disorder -     buPROPion (WELLBUTRIN XL) 150 MG 24 hr tablet; Take 1 tablet (150 mg total) by mouth daily.  Bipolar II disorder (HCC) -     lamoTRIgine (LAMICTAL) 150 MG tablet; Take 1 tablet (150 mg total) by mouth at bedtime.     Please see After Visit  Summary for patient specific instructions.  No future appointments.  No orders of the defined types were placed in this encounter.   -------------------------------

## 2022-11-30 ENCOUNTER — Other Ambulatory Visit (HOSPITAL_COMMUNITY): Payer: Self-pay

## 2022-12-23 DIAGNOSIS — Z13220 Encounter for screening for lipoid disorders: Secondary | ICD-10-CM | POA: Diagnosis not present

## 2022-12-23 DIAGNOSIS — Z131 Encounter for screening for diabetes mellitus: Secondary | ICD-10-CM | POA: Diagnosis not present

## 2022-12-23 DIAGNOSIS — Z713 Dietary counseling and surveillance: Secondary | ICD-10-CM | POA: Diagnosis not present

## 2022-12-23 DIAGNOSIS — Z23 Encounter for immunization: Secondary | ICD-10-CM | POA: Diagnosis not present

## 2022-12-30 ENCOUNTER — Other Ambulatory Visit (HOSPITAL_COMMUNITY): Payer: Self-pay

## 2023-02-01 ENCOUNTER — Other Ambulatory Visit (HOSPITAL_COMMUNITY): Payer: Self-pay

## 2023-03-08 ENCOUNTER — Other Ambulatory Visit (HOSPITAL_COMMUNITY): Payer: Self-pay

## 2023-03-08 ENCOUNTER — Other Ambulatory Visit: Payer: Self-pay | Admitting: Adult Health

## 2023-03-08 DIAGNOSIS — F909 Attention-deficit hyperactivity disorder, unspecified type: Secondary | ICD-10-CM

## 2023-03-08 MED ORDER — AMPHETAMINE-DEXTROAMPHET ER 30 MG PO CP24
30.0000 mg | ORAL_CAPSULE | Freq: Every day | ORAL | 0 refills | Status: DC
  Filled 2023-03-08: qty 30, 30d supply, fill #0

## 2023-03-08 NOTE — Telephone Encounter (Signed)
LF 12/17 LV 10/10 DUE BACK IN FEB.

## 2023-03-08 NOTE — Telephone Encounter (Signed)
Please schedule pt an appt. Lv 10/10 due back in Feb.

## 2023-03-17 ENCOUNTER — Telehealth: Payer: Self-pay

## 2023-03-17 ENCOUNTER — Other Ambulatory Visit (HOSPITAL_COMMUNITY): Payer: Self-pay

## 2023-03-17 ENCOUNTER — Other Ambulatory Visit: Payer: Self-pay | Admitting: Family

## 2023-03-17 MED ORDER — BENZONATATE 200 MG PO CAPS
200.0000 mg | ORAL_CAPSULE | Freq: Three times a day (TID) | ORAL | 0 refills | Status: DC | PRN
  Filled 2023-03-17: qty 20, 7d supply, fill #0

## 2023-03-17 NOTE — Telephone Encounter (Signed)
[name], Alexis L  CR   03/17/23  8:28 AM Unsigned Note Copied from CRM [id number]. Topic: Clinical - Medical Advice >> Mar 17, 2023  8:16 AM Dennison Nancy wrote: Reason for CRM: Patient getting over cold or flu feeling a lot better, have lingering cough, hurts when cough, still have congestion , Want to know if Provider can prescribe a cough medicine

## 2023-03-17 NOTE — Telephone Encounter (Signed)
Copied from CRM [id number]. Topic: Clinical - Medical Advice >> Mar 17, 2023  8:16 AM Alexis [name] wrote: Reason for CRM: Patient getting over cold or flu feeling a lot better, have lingering cough, hurts when cough, still have congestion , Want to know if Provider can prescribe a cough medicine

## 2023-03-17 NOTE — Telephone Encounter (Signed)
Opened new encounter

## 2023-03-28 ENCOUNTER — Telehealth: Payer: BC Managed Care – PPO | Admitting: Adult Health

## 2023-03-28 ENCOUNTER — Encounter: Payer: Self-pay | Admitting: Adult Health

## 2023-03-28 ENCOUNTER — Other Ambulatory Visit (HOSPITAL_COMMUNITY): Payer: Self-pay

## 2023-03-28 DIAGNOSIS — F411 Generalized anxiety disorder: Secondary | ICD-10-CM

## 2023-03-28 DIAGNOSIS — F909 Attention-deficit hyperactivity disorder, unspecified type: Secondary | ICD-10-CM

## 2023-03-28 DIAGNOSIS — F3181 Bipolar II disorder: Secondary | ICD-10-CM | POA: Diagnosis not present

## 2023-03-28 MED ORDER — AMPHETAMINE-DEXTROAMPHET ER 30 MG PO CP24
30.0000 mg | ORAL_CAPSULE | Freq: Every day | ORAL | 0 refills | Status: AC
Start: 2023-03-28 — End: ?
  Filled 2023-03-28 – 2023-04-13 (×2): qty 30, 30d supply, fill #0

## 2023-03-28 MED ORDER — AMPHETAMINE-DEXTROAMPHET ER 30 MG PO CP24
30.0000 mg | ORAL_CAPSULE | Freq: Every day | ORAL | 0 refills | Status: DC
  Filled 2023-05-16: qty 30, 30d supply, fill #0

## 2023-03-28 MED ORDER — AMPHETAMINE-DEXTROAMPHET ER 30 MG PO CP24
30.0000 mg | ORAL_CAPSULE | Freq: Every day | ORAL | 0 refills | Status: DC
  Filled 2023-06-21: qty 30, 30d supply, fill #0

## 2023-03-28 NOTE — Progress Notes (Signed)
 Alexis [name] [id number] [birth year] 33 y.o.  Virtual Visit via Video Note  I connected with pt @ on 03/28/23 at  5:30 PM EST by a video enabled telemedicine application and verified that I am speaking with the correct person using two identifiers.   I discussed the limitations of evaluation and management by telemedicine and the availability of in person appointments. The patient expressed understanding and agreed to proceed.  I discussed the assessment and treatment plan with the patient. The patient was provided an opportunity to ask questions and all were answered. The patient agreed with the plan and demonstrated an understanding of the instructions.   The patient was advised to call back or seek an in-person evaluation if the symptoms worsen or if the condition fails to improve as anticipated.  I provided 15 minutes of non-face-to-face time during this encounter.  The patient was located at home.  The provider was located at [facility].   Reagan Camera, NP   Subjective:   Patient ID:  Alexis [name] is a 33 y.o. (DOB Jul 14, 1990) female.  Chief Complaint: No chief complaint on file.   HPI DAWNNE CATALLO presents for follow-up of ADHD, GAD, and BPD 2.   Describes mood today as "ok". Pleasant. Mood symptoms - reports some depression, anxiety and irritability - "it comes and goes". Reports interest and motivation. Denies panic attacks. Reports some worry, rumination, and over thinking. Reports recovering from recent illness. Mood is variable. Stating "overall, I feel like I'm doing alright". Feels like current medication regimen works well. Taking medications as prescribed.  Energy levels stable. Active, has a regular exercise routine.  Enjoys some usual interests and activities. Single. Dating. Lives alone. Spending time with family - parents local. Talking with friends.  Appetite adequate. Weight gain - 135 to 145 pounds.  Sleeps better some nights  than others. Averages 6 to 8 hours. Focus and concentration stable. Completing tasks. Managing aspects of household. 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  Denies AH or VH. Denies self harm. Denies substance use.  Review of Systems:  Review of Systems  Musculoskeletal:  Negative for gait problem.  Neurological:  Negative for tremors.  Psychiatric/Behavioral:         Please refer to HPI    Medications: I have reviewed the patient's current medications.  Current Outpatient Medications  Medication Sig Dispense Refill   benzonatate  (TESSALON ) 200 MG capsule Take 1 capsule (200 mg total) by mouth 3 (three) times daily as needed for cough. 20 capsule 0   amphetamine -dextroamphetamine  (ADDERALL XR) 30 MG 24 hr capsule Take 1 capsule (30 mg total) by mouth daily. 30 capsule 0   amphetamine -dextroamphetamine  (ADDERALL XR) 30 MG 24 hr capsule Take 1 capsule (30 mg total) by mouth daily. 30 capsule 0   amphetamine -dextroamphetamine  (ADDERALL XR) 30 MG 24 hr capsule Take 1 capsule (30 mg total) by mouth daily. 30 capsule 0   buPROPion  (WELLBUTRIN  XL) 150 MG 24 hr tablet Take 1 tablet (150 mg total) by mouth daily. 90 tablet 3   lamoTRIgine  (LAMICTAL ) 150 MG tablet Take 1 tablet (150 mg total) by mouth at bedtime. 90 tablet 3   metroNIDAZOLE  (FLAGYL ) 500 MG tablet Take 1 tablet (500 mg total) by mouth 3 (three) times daily. 21 tablet 0   Multiple Vitamin (MULTI-VITAMIN PO) Take by mouth.     nicotine polacrilex (NICORETTE) 2 MG gum Take 2 mg by mouth as needed for smoking cessation.  triamcinolone  cream (KENALOG ) 0.1 % Apply 1 Application topically 2 (two) times daily. 45 g 0   No current facility-administered medications for this visit.    Medication Side Effects: None  Allergies:  Allergies  Allergen Reactions   Hornet Venom Anaphylaxis    Past Medical History:  Diagnosis Date   ACNE VULGARIS 04/28/2007   Alcohol abuse, in remission     hx of amnesia     ALCOHOL USE 07/22/2009   Anxiety    ATTENTION DEFICIT DISORDER 04/28/2007    hx of speech therapy   Bipolar disorder (HCC)    Depression    EXERCISE INDUCED ASTHMA 04/28/2007   History of pyelonephritis 05/2009   HSV (herpes simplex virus) anogenital infection     Family History  Problem Relation Age of Onset   Heart attack Mother    Asthma Mother    Alcohol abuse Paternal Grandmother    Depression Paternal Grandmother    Diabetes type II Maternal Grandmother    Dementia Maternal Grandfather    Stroke Paternal Grandfather     Social History   Socioeconomic History   Marital status: Single    Spouse name: Not on file   Number of children: Not on file   Years of education: Not on file   Highest education level: Bachelor's degree (e.g., BA, AB, BS)  Occupational History   Not on file  Tobacco Use   Smoking status: Former    Current packs/day: 0.00    Types: Cigarettes    Quit date: 08/16/2022    Years since quitting: 0.6   Smokeless tobacco: Never  Vaping Use   Vaping status: Never Used  Substance and Sexual Activity   Alcohol use: Yes    Alcohol/week: 14.0 - 20.0 standard drinks of alcohol    Types: 14 - 20 Glasses of wine per week   Drug use: No    Comment: In the past- Pot 3 months ago   Sexual activity: Yes    Birth control/protection: I.U.D.  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   [employer] to finish school degress   Social Drivers of Health   Financial Resource Strain: Medium Risk (11/02/2022)   Overall Financial Resource Strain (CARDIA)    Difficulty of Paying Living Expenses: Somewhat hard  Food Insecurity: No Food Insecurity (11/02/2022)   Hunger Vital Sign    Worried About  Running Out of Food in the Last Year: Never true    Ran Out of Food in the Last Year: Never true  Transportation Needs: No Transportation Needs (11/02/2022)   PRAPARE - Administrator, Civil Service (Medical): No    Lack of Transportation (Non-Medical): No  Physical Activity: Insufficiently Active (11/02/2022)   Exercise Vital Sign    Days of Exercise per Week: 3 days    Minutes of Exercise per Session: 20 min  Stress: Stress Concern Present (11/02/2022)   [employer] of Occupational Health - Occupational Stress Questionnaire    Feeling of Stress : To some extent  Social Connections: Moderately Integrated (11/02/2022)   Social Connection and Isolation Panel [NHANES]  Frequency of Communication with Friends and Family: Three times a week    Frequency of Social Gatherings with Friends and Family: Twice a week    Attends Religious Services: 1 to 4 times per year    Active Member of [employer] or Organizations: Yes    Attends Banker Meetings: 1 to 4 times per year    Marital Status: Never married  Intimate Partner Violence: Not on file    Past Medical History, Surgical history, Social history, and Family history were reviewed and updated as appropriate.   Please see review of systems for further details on the patient's review from today.   Objective:   Physical Exam:  There were no vitals taken for this visit.  Physical Exam Constitutional:      General: She is not in acute distress. Musculoskeletal:        General: No deformity.  Neurological:     Mental Status: She is alert and oriented to person, place, and time.     Coordination: Coordination normal.  Psychiatric:        Attention and Perception: Attention and perception normal. She does not perceive auditory or visual hallucinations.        Mood and Affect: Affect is not labile, blunt, angry or inappropriate.        Speech: Speech normal.        Behavior: Behavior normal.        Thought Content:  Thought content normal. Thought content is not paranoid or delusional. Thought content does not include homicidal or suicidal ideation. Thought content does not include homicidal or suicidal plan.        Cognition and Memory: Cognition and memory normal.        Judgment: Judgment normal.     Comments: Insight intact     Lab Review:     Component Value Date/Time   NA 143 05/19/2019 2245   K 4.0 05/19/2019 2245   CL 109 05/19/2019 2245   CO2 21 (L) 05/19/2019 2245   GLUCOSE 92 05/19/2019 2245   BUN 10 05/19/2019 2245   CREATININE 0.50 05/19/2019 2245   CALCIUM 9.2 05/19/2019 2245   PROT 8.6 (H) 05/19/2019 2245   ALBUMIN 4.6 05/19/2019 2245   AST 18 05/19/2019 2245   ALT 19 05/19/2019 2245   ALKPHOS 80 05/19/2019 2245   BILITOT 0.4 05/19/2019 2245   GFRNONAA >60 05/19/2019 2245   GFRAA >60 05/19/2019 2245       Component Value Date/Time   WBC 9.8 05/19/2019 2245   RBC 5.37 (H) 05/19/2019 2245   HGB 16.8 (H) 05/19/2019 2245   HCT 49.3 (H) 05/19/2019 2245   PLT 297 05/19/2019 2245   MCV 91.8 05/19/2019 2245   MCV 90.3 08/21/2012 2004   MCH 31.3 05/19/2019 2245   MCHC 34.1 05/19/2019 2245   RDW 11.9 05/19/2019 2245   LYMPHSABS 2.2 05/19/2019 2245   MONOABS 0.5 05/19/2019 2245   EOSABS 0.0 05/19/2019 2245   BASOSABS 0.0 05/19/2019 2245    No results found for: "POCLITH", "LITHIUM"   No results found for: "PHENYTOIN", "PHENOBARB", "VALPROATE", "CBMZ"   .res Assessment: Plan:    Plan:  Adderall XR 30mg  daily  Lamictal  150mg  daily Wellbutrin  XL 150mg  daily  Monitor BP between visits while taking stimulant medication.   RTC 4 months  15 minutes spent dedicated to the care of this patient on the date of this encounter to include pre-visit review of records, ordering of medication, post visit documentation,  and face-to-face time with the patient discussing ADHD, GAD, and BPD 2. Discussed continuing current medication regimen.  Patient advised to contact office  with any questions, adverse effects, or acute worsening in signs and symptoms.  Discussed potential benefits, risks, and side effects of stimulants with patient to include increased heart rate, palpitations, insomnia, increased anxiety, increased irritability, or decreased appetite.  Instructed patient to contact office if experiencing any significant tolerability issues.  Counseled patient regarding potential benefits, risks, and side effects of Lamictal  to include potential risk of Stevens-Johnson syndrome. Advised patient to stop taking Lamictal  and contact office immediately if rash develops and to seek urgent medical attention if rash is severe and/or spreading quickly.  There are no diagnoses linked to this encounter.   Please see After Visit Summary for patient specific instructions.  Future Appointments  Date Time Provider Department Center  03/28/2023  5:30 PM Parish Dubose Nattalie, NP [facility] None    No orders of the defined types were placed in this encounter.     -------------------------------

## 2023-03-29 ENCOUNTER — Other Ambulatory Visit (HOSPITAL_COMMUNITY): Payer: Self-pay

## 2023-04-13 ENCOUNTER — Other Ambulatory Visit (HOSPITAL_COMMUNITY): Payer: Self-pay

## 2023-05-16 ENCOUNTER — Other Ambulatory Visit (HOSPITAL_COMMUNITY): Payer: Self-pay

## 2023-06-21 ENCOUNTER — Other Ambulatory Visit (HOSPITAL_COMMUNITY): Payer: Self-pay

## 2023-07-26 ENCOUNTER — Other Ambulatory Visit: Payer: Self-pay | Admitting: Adult Health

## 2023-07-26 ENCOUNTER — Other Ambulatory Visit (HOSPITAL_COMMUNITY): Payer: Self-pay

## 2023-07-26 DIAGNOSIS — F909 Attention-deficit hyperactivity disorder, unspecified type: Secondary | ICD-10-CM

## 2023-07-26 NOTE — Telephone Encounter (Signed)
Please call to schedule FU, was due in May.

## 2023-07-27 ENCOUNTER — Other Ambulatory Visit (HOSPITAL_COMMUNITY): Payer: Self-pay

## 2023-07-28 ENCOUNTER — Other Ambulatory Visit (HOSPITAL_COMMUNITY): Payer: Self-pay

## 2023-07-28 MED ORDER — AMPHETAMINE-DEXTROAMPHET ER 30 MG PO CP24
30.0000 mg | ORAL_CAPSULE | Freq: Every day | ORAL | 0 refills | Status: DC
  Filled 2023-07-28: qty 30, 30d supply, fill #0

## 2023-07-28 NOTE — Telephone Encounter (Signed)
 Pt is scheduled for Monday 06/16/2. Pleaase send in script

## 2023-08-01 ENCOUNTER — Ambulatory Visit: Admitting: Adult Health

## 2023-08-01 ENCOUNTER — Encounter: Payer: Self-pay | Admitting: Adult Health

## 2023-08-01 ENCOUNTER — Other Ambulatory Visit (HOSPITAL_COMMUNITY): Payer: Self-pay

## 2023-08-01 DIAGNOSIS — F411 Generalized anxiety disorder: Secondary | ICD-10-CM | POA: Diagnosis not present

## 2023-08-01 DIAGNOSIS — F3181 Bipolar II disorder: Secondary | ICD-10-CM | POA: Diagnosis not present

## 2023-08-01 DIAGNOSIS — F909 Attention-deficit hyperactivity disorder, unspecified type: Secondary | ICD-10-CM | POA: Diagnosis not present

## 2023-08-01 MED ORDER — AMPHETAMINE-DEXTROAMPHET ER 30 MG PO CP24
30.0000 mg | ORAL_CAPSULE | Freq: Every day | ORAL | 0 refills | Status: DC
Start: 2023-08-01 — End: 2023-12-01
  Filled 2023-08-01 – 2023-08-26 (×2): qty 30, 30d supply, fill #0

## 2023-08-01 MED ORDER — AMPHETAMINE-DEXTROAMPHET ER 30 MG PO CP24
30.0000 mg | ORAL_CAPSULE | Freq: Every day | ORAL | 0 refills | Status: DC
  Filled 2023-09-30: qty 30, 30d supply, fill #0

## 2023-08-01 MED ORDER — AMPHETAMINE-DEXTROAMPHET ER 30 MG PO CP24
30.0000 mg | ORAL_CAPSULE | Freq: Every day | ORAL | 0 refills | Status: DC
  Filled 2023-11-02: qty 30, 30d supply, fill #0

## 2023-08-01 NOTE — Addendum Note (Signed)
 Addended by: Reagan Camera on: 08/01/2023 12:00 PM   Modules accepted: Level of Service

## 2023-08-01 NOTE — Progress Notes (Addendum)
 Alexis [name] [id number] 1990/09/07 33 y.o.  Subjective:   Patient ID:  Alexis [name] is a 33 y.o. (DOB 12-Apr-1990) female.  Chief Complaint: No chief complaint on file.   HPI Alexis [name] presents for follow-up of ADHD, GAD, and BPD 2.   Describes mood today as ok. Pleasant. Mood symptoms - reports some depression, anxiety and irritability - a little bit. Reports stable interest and motivation. Denies panic attacks. Reports some worry, rumination and over thinking. Reports recent break up. Reports mood is variable. Stating I feel like I'm doing ok. Feels like current medication regimen works well. Taking medications as prescribed.  Energy levels stable. Active, exercising some days.  Enjoys some usual interests and activities. Single. Lives alone. Spending time with family - parents local. Talking with friends.  Appetite adequate. Weight gain - 135 to 145 pounds.  Sleeps better some nights than others. Averages 6 to 8 hours. Focus and concentration stable with medication. Completing tasks. Managing aspects of household. 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  Denies AH or VH. Denies self harm. Denies substance use.  Review of Systems:  Review of Systems  Musculoskeletal:  Negative for gait problem.  Neurological:  Negative for tremors.  Psychiatric/Behavioral:         Please refer to HPI    Medications: I have reviewed the patient's current medications.  Current Outpatient Medications  Medication Sig Dispense Refill   amphetamine -dextroamphetamine  (ADDERALL XR) 30 MG 24 hr capsule Take 1 capsule (30 mg total) by mouth daily. 30 capsule 0   [START ON 08/29/2023] amphetamine -dextroamphetamine  (ADDERALL XR) 30 MG 24 hr capsule Take 1 capsule (30 mg total) by mouth daily. 30 capsule 0   [START ON 09/26/2023] amphetamine -dextroamphetamine  (ADDERALL XR) 30 MG 24 hr capsule Take 1 capsule (30 mg total) by mouth daily. 30 capsule 0    buPROPion  (WELLBUTRIN  XL) 150 MG 24 hr tablet Take 1 tablet (150 mg total) by mouth daily. 90 tablet 3   lamoTRIgine  (LAMICTAL ) 150 MG tablet Take 1 tablet (150 mg total) by mouth at bedtime. 90 tablet 3   Multiple Vitamin (MULTI-VITAMIN PO) Take by mouth.     nicotine polacrilex (NICORETTE) 2 MG gum Take 2 mg by mouth as needed for smoking cessation.     triamcinolone  cream (KENALOG ) 0.1 % Apply 1 Application topically 2 (two) times daily. 45 g 0   No current facility-administered medications for this visit.    Medication Side Effects: None  Allergies:  Allergies  Allergen Reactions   Hornet Venom Anaphylaxis    Past Medical History:  Diagnosis Date   ACNE VULGARIS 04/28/2007   Alcohol abuse, in remission     hx of amnesia    ALCOHOL USE 07/22/2009   Anxiety    ATTENTION DEFICIT DISORDER 04/28/2007    hx of speech therapy   Bipolar disorder (HCC)    Depression    EXERCISE INDUCED ASTHMA 04/28/2007   History of pyelonephritis 05/2009   HSV (herpes simplex virus) anogenital infection     Family History  Problem Relation Age of Onset   Heart attack Mother    Asthma Mother    Alcohol abuse Paternal Grandmother    Depression Paternal Grandmother    Diabetes type II Maternal Grandmother    Dementia Maternal Grandfather    Stroke Paternal Grandfather     Social History   Socioeconomic History   Marital status: Single    Spouse name: Not on file  Number of children: Not on file   Years of education: Not on file   Highest education level: Bachelor's degree (e.g., BA, AB, BS)  Occupational History   Not on file  Tobacco Use   Smoking status: Former    Current packs/day: 0.00    Types: Cigarettes    Quit date: 08/16/2022    Years since quitting: 0.9   Smokeless tobacco: Never  Vaping Use   Vaping status: Never Used  Substance and Sexual Activity   Alcohol use: Yes    Alcohol/week: 14.0 - 20.0 standard drinks of alcohol    Types: 14 - 20 Glasses of wine per week    Drug use: No    Comment: In the past- Pot 3 months ago   Sexual activity: Yes    Birth control/protection: I.U.D.  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   [employer] to finish school degress   Social Drivers of Health   Financial Resource Strain: Medium Risk (11/02/2022)   Overall Financial Resource Strain (CARDIA)    Difficulty of Paying Living Expenses: Somewhat hard  Food Insecurity: No Food Insecurity (11/02/2022)   Hunger Vital Sign    Worried About Running Out of Food in the Last Year: Never true    Ran Out of Food in the Last Year: Never true  Transportation Needs: No Transportation Needs (11/02/2022)   PRAPARE - Administrator, Civil Service (Medical): No    Lack of Transportation (Non-Medical): No  Physical Activity: Insufficiently Active (11/02/2022)   Exercise Vital Sign    Days of Exercise per Week: 3 days    Minutes of Exercise per Session: 20 min  Stress: Stress Concern Present (11/02/2022)   [employer] of Occupational Health - Occupational Stress Questionnaire    Feeling of Stress : To some extent  Social Connections: Moderately Integrated (11/02/2022)   Social Connection and Isolation Panel    Frequency of Communication with Friends and Family: Three times a week    Frequency of Social Gatherings with Friends and Family: Twice a week    Attends Religious Services: 1 to 4 times per year    Active Member of [employer] or Organizations: Yes    Attends Banker Meetings: 1 to 4 times per year    Marital Status: Never married  Intimate Partner Violence: Not on file    Past Medical History, Surgical history, Social history, and Family history were reviewed and updated  as appropriate.   Please see review of systems for further details on the patient's review from today.   Objective:   Physical Exam:  There were no vitals taken for this visit.  Physical Exam  Lab Review:     Component Value Date/Time   NA 143 05/19/2019 2245   K 4.0 05/19/2019 2245   CL 109 05/19/2019 2245   CO2 21 (L) 05/19/2019 2245   GLUCOSE 92 05/19/2019 2245   BUN 10 05/19/2019 2245   CREATININE 0.50 05/19/2019 2245   CALCIUM 9.2 05/19/2019 2245   PROT 8.6 (H) 05/19/2019 2245   ALBUMIN 4.6 05/19/2019 2245   AST 18  05/19/2019 2245   ALT 19 05/19/2019 2245   ALKPHOS 80 05/19/2019 2245   BILITOT 0.4 05/19/2019 2245   GFRNONAA >60 05/19/2019 2245   GFRAA >60 05/19/2019 2245       Component Value Date/Time   WBC 9.8 05/19/2019 2245   RBC 5.37 (H) 05/19/2019 2245   HGB 16.8 (H) 05/19/2019 2245   HCT 49.3 (H) 05/19/2019 2245   PLT 297 05/19/2019 2245   MCV 91.8 05/19/2019 2245   MCV 90.3 08/21/2012 2004   MCH 31.3 05/19/2019 2245   MCHC 34.1 05/19/2019 2245   RDW 11.9 05/19/2019 2245   LYMPHSABS 2.2 05/19/2019 2245   MONOABS 0.5 05/19/2019 2245   EOSABS 0.0 05/19/2019 2245   BASOSABS 0.0 05/19/2019 2245    No results found for: POCLITH, LITHIUM   No results found for: PHENYTOIN, PHENOBARB, VALPROATE, CBMZ   .res Assessment: Plan:    Plan:  Adderall XR 30mg  daily  Lamictal  150mg  daily Wellbutrin  XL 150mg  daily  Monitor BP between visits while taking stimulant medication.   RTC 4 months  15 minutes spent dedicated to the care of this patient on the date of this encounter to include pre-visit review of records, ordering of medication, post visit documentation, and face-to-face time with the patient discussing ADHD, GAD, and BPD 2. Discussed continuing current medication regimen.  Patient advised to contact office with any questions, adverse effects, or acute worsening in signs and symptoms.  Discussed potential benefits, risks, and side  effects of stimulants with patient to include increased heart rate, palpitations, insomnia, increased anxiety, increased irritability, or decreased appetite.  Instructed patient to contact office if experiencing any significant tolerability issues.  Counseled patient regarding potential benefits, risks, and side effects of Lamictal  to include potential risk of Stevens-Johnson syndrome. Advised patient to stop taking Lamictal  and contact office immediately if rash develops and to seek urgent medical attention if rash is severe and/or spreading quickly.  Diagnoses and all orders for this visit:  Attention deficit hyperactivity disorder (ADHD), unspecified ADHD type -     amphetamine -dextroamphetamine  (ADDERALL XR) 30 MG 24 hr capsule; Take 1 capsule (30 mg total) by mouth daily. -     amphetamine -dextroamphetamine  (ADDERALL XR) 30 MG 24 hr capsule; Take 1 capsule (30 mg total) by mouth daily. -     amphetamine -dextroamphetamine  (ADDERALL XR) 30 MG 24 hr capsule; Take 1 capsule (30 mg total) by mouth daily.  Generalized anxiety disorder  Bipolar II disorder (HCC)     Please see After Visit Summary for patient specific instructions.  No future appointments.  No orders of the defined types were placed in this encounter.     -------------------------------

## 2023-08-02 ENCOUNTER — Ambulatory Visit: Payer: Self-pay

## 2023-08-02 NOTE — Telephone Encounter (Signed)
   1st attempt, no answer. Left voicemail for patient to call back for nurse triage.   Reason for Triage: Possible UTI. Patient stated her urine has an odor and is cloudy. Denied having a burning sensation or pain.

## 2023-08-02 NOTE — Telephone Encounter (Signed)
 FYI Only or Action Required?: Action required by provider  Patient was last seen in primary care on 11/03/2022 by Donley Furth, MD. Called Nurse Triage reporting Dysuria. Symptoms began a week ago. Interventions attempted: Nothing. Symptoms are: unchanged.  Triage Disposition: See PCP Within 2 Weeks  Patient/caregiver understands and will follow disposition?: YesReason for Disposition  All other urine symptoms  Answer Assessment - Initial Assessment Questions 1. SYMPTOM: What's the main symptom you're concerned about? (e.g., frequency, incontinence)     Cloudy  2. ONSET: When did this   start?     Week ago 3. PAIN: Is there any pain? If Yes, ask: How bad is it? (Scale: 1-10; mild, moderate, severe)     denies 4. CAUSE: What do you think is causing the symptoms?     Possible UTI 5. OTHER SYMPTOMS: Do you have any other symptoms? (e.g., blood in urine, fever, flank pain, pain with urination)     Slight Odor    Pt is going out of the country soon and wants to make sure no UTI. Pt wants to get OTC test strips and increase hydration before seeing PCP. Appt 6/18.  Protocols used: Urinary Symptoms-A-AH

## 2023-08-04 ENCOUNTER — Other Ambulatory Visit (HOSPITAL_COMMUNITY)
Admission: RE | Admit: 2023-08-04 | Discharge: 2023-08-04 | Disposition: A | Discharge status: Home / Self Care | Source: Ambulatory Visit | Attending: Family Medicine | Admitting: Family Medicine

## 2023-08-04 ENCOUNTER — Ambulatory Visit (INDEPENDENT_AMBULATORY_CARE_PROVIDER_SITE_OTHER): Admitting: Family Medicine

## 2023-08-04 VITALS — BP 120/72 | HR 101 | Temp 98.6°F | Ht 64.0 in | Wt 138.2 lb

## 2023-08-04 DIAGNOSIS — R8290 Unspecified abnormal findings in urine: Secondary | ICD-10-CM | POA: Insufficient documentation

## 2023-08-04 DIAGNOSIS — N898 Other specified noninflammatory disorders of vagina: Secondary | ICD-10-CM | POA: Diagnosis not present

## 2023-08-04 LAB — POC URINALSYSI DIPSTICK (AUTOMATED)
Bilirubin, UA: NEGATIVE
Blood, UA: NEGATIVE
Glucose, UA: NEGATIVE
Ketones, UA: POSITIVE
Leukocytes, UA: NEGATIVE
Nitrite, UA: NEGATIVE
Protein, UA: POSITIVE — AB
Spec Grav, UA: >=1.03 — AB (ref 1.010–1.025)
Urobilinogen, UA: 0.2 U/dL
pH, UA: 6 (ref 5.0–8.0)

## 2023-08-04 NOTE — Progress Notes (Signed)
 Established Patient Office Visit   Subjective  Patient ID: Alexis [name], female    DOB: 1990/03/26  Age: 33 y.o. MRN: [id number]  Chief Complaint  Patient presents with   Medical Management of Chronic Issues    UTI     Pt is a 33 year old female followed by Dr. Ethel Henry and seen for acute concern.  Patient with malodorous urine x 1 week.  May have had mild dysuria.  Concerned about UTI given history as going to DR.  Patient denies nausea, vomiting, suprapubic pressure, cough pt, fever, chills, back pain, frequency.  Also mentions possible odor with minimal discharge.  Requesting STI testing.  States needs to drink more water.  Typically drinks five 8 ounce cups of coffee per day.    Patient Active Problem List   Diagnosis Date Noted   White coat syndrome without diagnosis of hypertension 10/09/2018   History of migraine headaches 08/29/2013   Breast nodule 06/15/2013   Visit for preventive health examination 06/15/2013   Encounter for routine gynecological examination 06/15/2013   Depression 01/16/2013   Contraception management 04/27/2011   Adjustment disorder with mixed anxiety and depressed mood 05/17/2010   Alcohol abuse 07/22/2009   Attention deficit disorder 04/28/2007   EXERCISE INDUCED ASTHMA 04/28/2007   PRIMARY DYSMENORRHEA 04/28/2007   ACNE VULGARIS 04/28/2007   Past Medical History:  Diagnosis Date   ACNE VULGARIS 04/28/2007   Alcohol abuse, in remission     hx of amnesia    ALCOHOL USE 07/22/2009   Anxiety    ATTENTION DEFICIT DISORDER 04/28/2007    hx of speech therapy   Bipolar disorder (HCC)    Depression    EXERCISE INDUCED ASTHMA 04/28/2007   History of pyelonephritis 05/2009   HSV (herpes simplex virus) anogenital infection    History reviewed. No pertinent surgical history. Social History   Tobacco Use   Smoking status: Former    Current packs/day: 0.00    Types: Cigarettes    Quit date: 08/16/2022    Years since quitting: 0.9   Smokeless  tobacco: Never  Vaping Use   Vaping status: Never Used  Substance Use Topics   Alcohol use: Yes    Alcohol/week: 14.0 - 20.0 standard drinks of alcohol    Types: 14 - 20 Glasses of wine per week   Drug use: No    Comment: In the past- Pot 3 months ago   Family History  Problem Relation Age of Onset   Heart attack Mother    Asthma Mother    Alcohol abuse Paternal Grandmother    Depression Paternal Grandmother    Diabetes type II Maternal Grandmother    Dementia Maternal Grandfather    Stroke Paternal Grandfather    Allergies  Allergen Reactions   Hornet Venom Anaphylaxis    ROS Negative unless stated above    Objective:     BP 120/72 (BP Location: Left Arm, Patient Position: Sitting, Cuff Size: Normal)   Pulse (!) 101   Temp 98.6 F (37 C) (Oral)   Ht 5' 4 (1.626 m)   Wt 138 lb 3.2 oz (62.7 kg)   SpO2 97%   BMI 23.72 kg/m  BP Readings from Last 3 Encounters:  08/04/23 120/72  11/03/22 126/82  08/20/22 (!) 140/88   Wt Readings from Last 3 Encounters:  08/04/23 138 lb 3.2 oz (62.7 kg)  11/03/22 142 lb 9.6 oz (64.7 kg)  08/20/22 141 lb (64 kg)      Physical Exam Constitutional:  General: She is not in acute distress.    Appearance: Normal appearance.  HENT:     Head: Normocephalic and atraumatic.     Nose: Nose normal.     Mouth/Throat:     Mouth: Mucous membranes are moist.   Cardiovascular:     Rate and Rhythm: Normal rate and regular rhythm.     Heart sounds: Normal heart sounds. No murmur heard.    No gallop.  Pulmonary:     Effort: Pulmonary effort is normal. No respiratory distress.     Breath sounds: Normal breath sounds. No wheezing, rhonchi or rales.  Abdominal:     Palpations: Abdomen is soft.     Tenderness: There is no abdominal tenderness. There is no guarding.  Genitourinary:    Comments: Swab collected.  Skin:    General: Skin is warm and dry.   Neurological:     Mental Status: She is alert and oriented to person, place,  and time.     Results for orders placed or performed in visit on 08/04/23  POCT Urinalysis Dipstick (Automated)  Result Value Ref Range   Color, UA dark yellow    Clarity, UA clear    Glucose, UA Negative Negative   Bilirubin, UA neg    Ketones, UA pos    Spec Grav, UA >=1.030 (A) 1.010 - 1.025   Blood, UA neg    pH, UA 6.0 5.0 - 8.0   Protein, UA Positive (A) Negative   Urobilinogen, UA 0.2 0.2 or 1.0 E.U./dL   Nitrite, UA neg    Leukocytes, UA Negative Negative      Assessment & Plan:   Malodorous urine -     POCT Urinalysis Dipstick (Automated) -     Cervicovaginal ancillary only  Vaginal odor -     Cervicovaginal ancillary only  Pt with concerns of possible UTI.  POC UA negative for acute infection.  Protein ketones, SG greater than 1.030.  Advised to increase p.o. intake of water especially with upcoming trip.  Aptima self swab collected.  Further recommendations if needed based on results.  Return if symptoms worsen or fail to improve.   Viola Greulich, MD

## 2023-08-05 LAB — CERVICOVAGINAL ANCILLARY ONLY
Bacterial Vaginitis (gardnerella): POSITIVE — AB
Candida Glabrata: NEGATIVE
Candida Vaginitis: POSITIVE — AB
Chlamydia: NEGATIVE
Comment: NEGATIVE
Comment: NEGATIVE
Comment: NEGATIVE
Comment: NEGATIVE
Comment: NEGATIVE
Comment: NORMAL
Neisseria Gonorrhea: NEGATIVE
Trichomonas: NEGATIVE

## 2023-08-08 ENCOUNTER — Other Ambulatory Visit (HOSPITAL_COMMUNITY): Payer: Self-pay

## 2023-08-08 ENCOUNTER — Other Ambulatory Visit: Payer: Self-pay | Admitting: Family Medicine

## 2023-08-08 ENCOUNTER — Ambulatory Visit: Payer: Self-pay | Admitting: Family Medicine

## 2023-08-08 ENCOUNTER — Telehealth: Payer: Self-pay

## 2023-08-08 DIAGNOSIS — B3731 Acute candidiasis of vulva and vagina: Secondary | ICD-10-CM

## 2023-08-08 DIAGNOSIS — B9689 Other specified bacterial agents as the cause of diseases classified elsewhere: Secondary | ICD-10-CM

## 2023-08-08 MED ORDER — FLUCONAZOLE 150 MG PO TABS
150.0000 mg | ORAL_TABLET | ORAL | 0 refills | Status: AC
  Filled 2023-08-08: qty 2, 3d supply, fill #0

## 2023-08-08 MED ORDER — METRONIDAZOLE 500 MG PO TABS
500.0000 mg | ORAL_TABLET | Freq: Two times a day (BID) | ORAL | 0 refills | Status: AC
  Filled 2023-08-08: qty 14, 7d supply, fill #0

## 2023-08-08 NOTE — Telephone Encounter (Signed)
 Copied from CRM [id number]. Topic: Clinical - Lab/Test Results >> Aug 08, 2023 10:17 AM Willma R wrote: Reason for CRM: Patient calling, seen her lab results on MyChart and would like to discuss. Advised no notes from dr yet. Is requesting a call back.  Patient can be reached at [phone]

## 2023-08-09 ENCOUNTER — Telehealth: Payer: Self-pay

## 2023-08-09 NOTE — Telephone Encounter (Signed)
 Spoke to pt. Pt reports that someone has already updated her but okay to continue. Inform pt of message below. Pt states she will take it as prescribed until she is getting ready to leave.  Pt reports she wished she had gotten the result earlier and Rx earlier last week. I apologized to pt for inconvenient. Pt states she has no further questions.

## 2023-08-09 NOTE — Telephone Encounter (Unsigned)
 Copied from CRM [id number]. Topic: Clinical - Medical Advice >> Aug 08, 2023  3:32 PM Alexis [name] wrote: Reason for CRM: Patient has been prescribed a medication for her BV and is concerned about the course of treatment being too long since she is going on vacation on Saturday morning. She is wondering if it's possible to take the medication 3x a day for a shorter treatment or if a shorter length medication could be prescribed in it's place if needed.

## 2023-08-09 NOTE — Telephone Encounter (Signed)
 Usually a 7 days rx  but your could take for 5 days and see if gets better. There is a 5 day vaginal gel also  metronidazole   but can't tell what the realtive cure rate is . Other options may  not be as good or need  PA for failures.  Taking ex per day  is high dose and   you could try this possibly  but  not standard fda  dosing  for this  problem  Why can't you take the 7 days ?

## 2023-08-15 NOTE — Telephone Encounter (Signed)
 Pt's concern was address on 08/09/2023. Please see phone encounter.

## 2023-08-26 ENCOUNTER — Other Ambulatory Visit (HOSPITAL_COMMUNITY): Payer: Self-pay

## 2023-09-30 ENCOUNTER — Other Ambulatory Visit (HOSPITAL_COMMUNITY): Payer: Self-pay

## 2023-11-02 ENCOUNTER — Other Ambulatory Visit (HOSPITAL_COMMUNITY): Payer: Self-pay

## 2023-12-01 ENCOUNTER — Other Ambulatory Visit (HOSPITAL_COMMUNITY): Payer: Self-pay

## 2023-12-01 ENCOUNTER — Ambulatory Visit (INDEPENDENT_AMBULATORY_CARE_PROVIDER_SITE_OTHER): Admitting: Adult Health

## 2023-12-01 ENCOUNTER — Encounter: Payer: Self-pay | Admitting: Adult Health

## 2023-12-01 DIAGNOSIS — F3181 Bipolar II disorder: Secondary | ICD-10-CM | POA: Diagnosis not present

## 2023-12-01 DIAGNOSIS — F909 Attention-deficit hyperactivity disorder, unspecified type: Secondary | ICD-10-CM

## 2023-12-01 DIAGNOSIS — F411 Generalized anxiety disorder: Secondary | ICD-10-CM

## 2023-12-01 MED ORDER — LAMOTRIGINE 150 MG PO TABS
150.0000 mg | ORAL_TABLET | Freq: Every day | ORAL | 3 refills | Status: AC
  Filled 2023-12-01 – 2024-02-14 (×3): qty 90, 90d supply, fill #0

## 2023-12-01 MED ORDER — BUPROPION HCL ER (XL) 150 MG PO TB24
150.0000 mg | ORAL_TABLET | Freq: Every day | ORAL | 3 refills | Status: AC
  Filled 2023-12-01 – 2024-02-14 (×3): qty 90, 90d supply, fill #0

## 2023-12-01 MED ORDER — AMPHETAMINE-DEXTROAMPHET ER 30 MG PO CP24
30.0000 mg | ORAL_CAPSULE | Freq: Every day | ORAL | 0 refills | Status: DC
  Filled 2023-12-01: qty 30, 30d supply, fill #0

## 2023-12-01 MED ORDER — AMPHETAMINE-DEXTROAMPHET ER 30 MG PO CP24
30.0000 mg | ORAL_CAPSULE | Freq: Every day | ORAL | 0 refills | Status: DC
  Filled 2024-02-14 (×2): qty 30, 30d supply, fill #0

## 2023-12-01 MED ORDER — AMPHETAMINE-DEXTROAMPHET ER 30 MG PO CP24
30.0000 mg | ORAL_CAPSULE | Freq: Every day | ORAL | 0 refills | Status: DC
  Filled 2024-01-06: qty 30, 30d supply, fill #0

## 2023-12-01 NOTE — Progress Notes (Signed)
 Alexis [name] [id number] 11-12-1990 33 y.o.  Subjective:   Patient ID:  Alexis [name] is a 33 y.o. (DOB September 03, 1990) female.  Chief Complaint: No chief complaint on file.   HPI Alexis [name] presents to the office today for follow-up of ADHD, GAD, and BPD 2.   Describes mood today as ok. Pleasant. Mood symptoms - reports some depression, anxiety and irritability. Reports stable interest and motivation. Denies panic attacks. Reports some worry, rumination and over thinking. Reports mood is relatively stable. Stating I feel like I'm doing alright. Feels like current medication regimen works well. Taking medications as prescribed.  Energy levels stable. Active, exercising some days.  Enjoys some usual interests and activities. Single. Lives alone. Spending time with family - parents local. Talking with friends.  Appetite adequate. Weight loss - 135 pounds.  Sleeps better some nights than others. Averages 6 to 8 hours. Focus and concentration stable with medication. Completing tasks. Managing aspects of household. 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  Denies AH or VH. Denies self harm. Denies substance use.   AUDIT    Flowsheet Row Office Visit from 08/04/2023 in [facility] [place] Office Visit from 11/03/2022 in [facility] [place]  Alcohol Use Disorder Identification Test Final Score (AUDIT) 9  11    GAD-7    Flowsheet Row Office Visit from 11/03/2022 in [facility] [place]  Total GAD-7 Score 5   PHQ2-9    Flowsheet Row Office Visit from 11/03/2022 in [facility] [place] Office Visit from 08/20/2022 in [facility] [place] Office Visit from 03/26/2021 in [facility] [place]  PHQ-2 Total Score 1 0 1  PHQ-9 Total Score 7 -- 5     Review of Systems:  Review of Systems   Musculoskeletal:  Negative for gait problem.  Neurological:  Negative for tremors.  Psychiatric/Behavioral:         Please refer to HPI    Medications: I have reviewed the patient's current medications.  Current Outpatient Medications  Medication Sig Dispense Refill   fluconazole  (DIFLUCAN ) 150 MG tablet Take 1 tablet (150 mg total) by mouth now, then repeat dose in 3 days. 2 tablet 0   amphetamine -dextroamphetamine  (ADDERALL XR) 30 MG 24 hr capsule Take 1 capsule (30 mg total) by mouth daily. 30 capsule 0   [START ON 12/29/2023] amphetamine -dextroamphetamine  (ADDERALL XR) 30 MG 24 hr capsule Take 1 capsule (30 mg total) by mouth daily. 30 capsule 0   [START ON 01/26/2024] amphetamine -dextroamphetamine  (ADDERALL XR) 30 MG 24 hr capsule Take 1 capsule (30 mg total) by mouth daily. 30 capsule 0   buPROPion  (WELLBUTRIN  XL) 150 MG 24 hr tablet Take 1 tablet (150 mg total) by mouth daily. 90 tablet 3   lamoTRIgine  (LAMICTAL ) 150 MG tablet Take 1 tablet (150 mg total) by mouth at bedtime. 90 tablet 3   Multiple Vitamin (MULTI-VITAMIN PO) Take by mouth.     nicotine polacrilex (NICORETTE) 2 MG gum Take 2 mg by mouth as needed for smoking cessation.     triamcinolone  cream (KENALOG ) 0.1 % Apply 1 Application topically 2 (two) times daily. 45 g 0   No current facility-administered medications for this visit.    Medication Side Effects: None  Allergies:  Allergies  Allergen Reactions   Hornet Venom Anaphylaxis    Past Medical History:  Diagnosis Date  ACNE VULGARIS 04/28/2007   Alcohol abuse, in remission     hx of amnesia    ALCOHOL USE 07/22/2009   Anxiety    ATTENTION DEFICIT DISORDER 04/28/2007    hx of speech therapy   Bipolar disorder (HCC)    Depression    EXERCISE INDUCED ASTHMA 04/28/2007   History of pyelonephritis 05/2009   HSV (herpes simplex virus) anogenital infection     Past Medical History, Surgical history, Social history, and Family history were reviewed and  updated as appropriate.   Please see review of systems for further details on the patient's review from today.   Objective:   Physical Exam:  There were no vitals taken for this visit.  Physical Exam Constitutional:      General: She is not in acute distress. Musculoskeletal:        General: No deformity.  Neurological:     Mental Status: She is alert and oriented to person, place, and time.     Coordination: Coordination normal.  Psychiatric:        Attention and Perception: Attention and perception normal. She does not perceive auditory or visual hallucinations.        Mood and Affect: Mood normal. Mood is not anxious or depressed. Affect is not labile, blunt, angry or inappropriate.        Speech: Speech normal.        Behavior: Behavior normal.        Thought Content: Thought content normal. Thought content is not paranoid or delusional. Thought content does not include homicidal or suicidal ideation. Thought content does not include homicidal or suicidal plan.        Cognition and Memory: Cognition and memory normal.        Judgment: Judgment normal.     Comments: Insight intact     Lab Review:     Component Value Date/Time   NA 143 05/19/2019 2245   K 4.0 05/19/2019 2245   CL 109 05/19/2019 2245   CO2 21 (L) 05/19/2019 2245   GLUCOSE 92 05/19/2019 2245   BUN 10 05/19/2019 2245   CREATININE 0.50 05/19/2019 2245   CALCIUM 9.2 05/19/2019 2245   PROT 8.6 (H) 05/19/2019 2245   ALBUMIN 4.6 05/19/2019 2245   AST 18 05/19/2019 2245   ALT 19 05/19/2019 2245   ALKPHOS 80 05/19/2019 2245   BILITOT 0.4 05/19/2019 2245   GFRNONAA >60 05/19/2019 2245   GFRAA >60 05/19/2019 2245       Component Value Date/Time   WBC 9.8 05/19/2019 2245   RBC 5.37 (H) 05/19/2019 2245   HGB 16.8 (H) 05/19/2019 2245   HCT 49.3 (H) 05/19/2019 2245   PLT 297 05/19/2019 2245   MCV 91.8 05/19/2019 2245   MCV 90.3 08/21/2012 2004   MCH 31.3 05/19/2019 2245   MCHC 34.1 05/19/2019 2245   RDW  11.9 05/19/2019 2245   LYMPHSABS 2.2 05/19/2019 2245   MONOABS 0.5 05/19/2019 2245   EOSABS 0.0 05/19/2019 2245   BASOSABS 0.0 05/19/2019 2245    No results found for: POCLITH, LITHIUM   No results found for: PHENYTOIN, PHENOBARB, VALPROATE, CBMZ   .res Assessment: Plan:    Plan:  Adderall XR 30mg  daily  Lamictal  150mg  daily Wellbutrin  XL 150mg  daily  Monitor BP between visits while taking stimulant medication.   RTC 4 months  15 minutes spent dedicated to the care of this patient on the date of this encounter to include pre-visit review of records, ordering of  medication, post visit documentation, and face-to-face time with the patient discussing ADHD, GAD, and BPD 2. Discussed continuing current medication regimen.  Patient advised to contact office with any questions, adverse effects, or acute worsening in signs and symptoms.  Discussed potential benefits, risks, and side effects of stimulants with patient to include increased heart rate, palpitations, insomnia, increased anxiety, increased irritability, or decreased appetite.  Instructed patient to contact office if experiencing any significant tolerability issues.  Counseled patient regarding potential benefits, risks, and side effects of Lamictal  to include potential risk of Stevens-Johnson syndrome. Advised patient to stop taking Lamictal  and contact office immediately if rash develops and to seek urgent medical attention if rash is severe and/or spreading quickly.  Diagnoses and all orders for this visit:  Bipolar II disorder (HCC) -     lamoTRIgine  (LAMICTAL ) 150 MG tablet; Take 1 tablet (150 mg total) by mouth at bedtime.  Yeast vaginitis  Generalized anxiety disorder -     buPROPion  (WELLBUTRIN  XL) 150 MG 24 hr tablet; Take 1 tablet (150 mg total) by mouth daily.  Attention deficit hyperactivity disorder (ADHD), unspecified ADHD type -     amphetamine -dextroamphetamine  (ADDERALL XR) 30 MG 24 hr  capsule; Take 1 capsule (30 mg total) by mouth daily. -     amphetamine -dextroamphetamine  (ADDERALL XR) 30 MG 24 hr capsule; Take 1 capsule (30 mg total) by mouth daily. -     amphetamine -dextroamphetamine  (ADDERALL XR) 30 MG 24 hr capsule; Take 1 capsule (30 mg total) by mouth daily.     Please see After Visit Summary for patient specific instructions.  Future Appointments  Date Time Provider Department Center  12/20/2023  2:35 PM Cleotilde Ronal RAMAN, MD [facility] [phone] [facility]    No orders of the defined types were placed in this encounter.   -------------------------------

## 2023-12-20 ENCOUNTER — Ambulatory Visit (HOSPITAL_BASED_OUTPATIENT_CLINIC_OR_DEPARTMENT_OTHER): Admitting: Obstetrics & Gynecology

## 2023-12-20 ENCOUNTER — Encounter (HOSPITAL_BASED_OUTPATIENT_CLINIC_OR_DEPARTMENT_OTHER): Payer: Self-pay | Admitting: Obstetrics & Gynecology

## 2023-12-20 VITALS — BP 134/88 | HR 108 | Ht 64.0 in | Wt 134.0 lb

## 2023-12-20 DIAGNOSIS — Z30433 Encounter for removal and reinsertion of intrauterine contraceptive device: Secondary | ICD-10-CM

## 2023-12-20 DIAGNOSIS — Z87891 Personal history of nicotine dependence: Secondary | ICD-10-CM

## 2023-12-20 DIAGNOSIS — Z3009 Encounter for other general counseling and advice on contraception: Secondary | ICD-10-CM

## 2023-12-20 MED ORDER — LEVONORGESTREL 19.5 MG IU IUD: INTRAUTERINE_SYSTEM | Freq: Once | INTRAUTERINE | Status: AC

## 2023-12-20 NOTE — Progress Notes (Signed)
 GYNECOLOGY  VISIT  33 y.o. G0P0000 Single Caucasian female presents for removal of Kyleena  IUD and re-insertion of IUD.  She feels IUD is best option for her at this time for contraception.  Pt has also been counseled about risks and benefits as well as complications.  Consent is obtained today.  All questions answered prior to start of procedure.   Recent STD testing:  07/2023 neg GC/Chl.  No new partner since that time. LMP:  No LMP recorded. (Menstrual status: IUD).  Patient Active Problem List   Diagnosis Date Noted   White coat syndrome without diagnosis of hypertension 10/09/2018   History of migraine headaches 08/29/2013   Breast nodule 06/15/2013   Visit for preventive health examination 06/15/2013   Encounter for routine gynecological examination 06/15/2013   Depression 01/16/2013   Contraception management 04/27/2011   Adjustment disorder with mixed anxiety and depressed mood 05/17/2010   Alcohol abuse 07/22/2009   Attention deficit disorder 04/28/2007   EXERCISE INDUCED ASTHMA 04/28/2007   PRIMARY DYSMENORRHEA 04/28/2007   ACNE VULGARIS 04/28/2007   Past Medical History:  Diagnosis Date   ACNE VULGARIS 04/28/2007   Alcohol abuse, in remission     hx of amnesia    ALCOHOL USE 07/22/2009   Anxiety    ATTENTION DEFICIT DISORDER 04/28/2007    hx of speech therapy   Bipolar disorder (HCC)    Depression    EXERCISE INDUCED ASTHMA 04/28/2007   History of pyelonephritis 05/2009   HSV (herpes simplex virus) anogenital infection    Current Outpatient Medications on File Prior to Visit  Medication Sig Dispense Refill   fluconazole  (DIFLUCAN ) 150 MG tablet Take 1 tablet (150 mg total) by mouth now, then repeat dose in 3 days. 2 tablet 0   amphetamine -dextroamphetamine  (ADDERALL XR) 30 MG 24 hr capsule Take 1 capsule (30 mg total) by mouth daily. 30 capsule 0   [START ON 12/29/2023] amphetamine -dextroamphetamine  (ADDERALL XR) 30 MG 24 hr capsule Take 1 capsule (30 mg total) by  mouth daily. 30 capsule 0   [START ON 01/26/2024] amphetamine -dextroamphetamine  (ADDERALL XR) 30 MG 24 hr capsule Take 1 capsule (30 mg total) by mouth daily. 30 capsule 0   buPROPion  (WELLBUTRIN  XL) 150 MG 24 hr tablet Take 1 tablet (150 mg total) by mouth daily. 90 tablet 3   lamoTRIgine  (LAMICTAL ) 150 MG tablet Take 1 tablet (150 mg total) by mouth at bedtime. 90 tablet 3   Multiple Vitamin (MULTI-VITAMIN PO) Take by mouth.     nicotine polacrilex (NICORETTE) 2 MG gum Take 2 mg by mouth as needed for smoking cessation.     triamcinolone  cream (KENALOG ) 0.1 % Apply 1 Application topically 2 (two) times daily. 45 g 0   No current facility-administered medications on file prior to visit.   Hornet venom  Review of Systems  Constitutional: Negative.   Genitourinary: Negative.    Vitals:   12/20/23 1435  BP: 134/88  Pulse: (!) 108  SpO2: 98%  Weight: 134 lb (60.8 kg)  Height: 5' 4 (1.626 m)    Gen:  WNWF healthy female NAD Abdomen: soft, non-tender Groin:  no inguinal nodes palpated  Pelvic exam: Vulva:  normal female genitalia Vagina:  normal vagina Cervix:  Non-tender, Negative CMT, no lesions or redness. Uterus:  normal shape, position and consistency   Procedure:  Speculum reinserted.  Cervix visualized and cleansed with Betadine x 3.  Single toothed tenaculum applied to anterior lip of cervix without difficulty.  IUD string noted and  grasped with ringed forcep.  With one pull, IUD removed easily.  Pt has some mild cramping but tolerated this well.  Then uterus sounded to 7cm.  IUD package was opened.  IUD and introducer passed to fundus and then withdrawn slightly before IUD was passed into endometrial cavity.  Introducer removed.  Strings cut to 2cm.  Tenaculum removed from cervix.  Minimal bleeding noted.  Pt tolerated the procedure well.  All instruments removed from vagina.  Assessment/Plan: 1. Encounter for IUD removal and reinsertion (Primary) - recheck 6-8 weeks -  Pt aware to call for any concerns - Pt aware removal due no later than 11/42030.  IUD card given to pt.  2. Encounter for other general counseling or advice on contraception  3. Smoking hx

## 2023-12-22 DIAGNOSIS — Z713 Dietary counseling and surveillance: Secondary | ICD-10-CM | POA: Diagnosis not present

## 2023-12-22 DIAGNOSIS — Z23 Encounter for immunization: Secondary | ICD-10-CM | POA: Diagnosis not present

## 2023-12-22 DIAGNOSIS — Z13220 Encounter for screening for lipoid disorders: Secondary | ICD-10-CM | POA: Diagnosis not present

## 2023-12-22 DIAGNOSIS — Z131 Encounter for screening for diabetes mellitus: Secondary | ICD-10-CM | POA: Diagnosis not present

## 2024-01-06 ENCOUNTER — Other Ambulatory Visit (HOSPITAL_COMMUNITY): Payer: Self-pay

## 2024-02-14 ENCOUNTER — Other Ambulatory Visit (HOSPITAL_COMMUNITY): Payer: Self-pay

## 2024-02-14 ENCOUNTER — Other Ambulatory Visit: Payer: Self-pay

## 2024-02-20 ENCOUNTER — Ambulatory Visit (HOSPITAL_BASED_OUTPATIENT_CLINIC_OR_DEPARTMENT_OTHER): Admitting: Obstetrics & Gynecology

## 2024-03-15 ENCOUNTER — Other Ambulatory Visit: Payer: Self-pay

## 2024-03-15 ENCOUNTER — Other Ambulatory Visit: Payer: Self-pay | Admitting: Adult Health

## 2024-03-15 ENCOUNTER — Other Ambulatory Visit (HOSPITAL_COMMUNITY): Payer: Self-pay

## 2024-03-15 DIAGNOSIS — F909 Attention-deficit hyperactivity disorder, unspecified type: Secondary | ICD-10-CM

## 2024-03-15 MED ORDER — AMPHETAMINE-DEXTROAMPHET ER 30 MG PO CP24
30.0000 mg | ORAL_CAPSULE | Freq: Every day | ORAL | 0 refills | Status: DC
  Filled 2024-03-15: qty 30, 30d supply, fill #0

## 2024-03-15 MED ORDER — AMPHETAMINE-DEXTROAMPHET ER 30 MG PO CP24: 30.0000 mg | ORAL_CAPSULE | Freq: Every day | ORAL | 0 refills | Status: DC

## 2024-03-20 ENCOUNTER — Other Ambulatory Visit (HOSPITAL_COMMUNITY): Payer: Self-pay

## 2024-03-20 ENCOUNTER — Telehealth: Admitting: Adult Health

## 2024-03-20 ENCOUNTER — Encounter: Payer: Self-pay | Admitting: Adult Health

## 2024-03-20 DIAGNOSIS — F411 Generalized anxiety disorder: Secondary | ICD-10-CM

## 2024-03-20 DIAGNOSIS — F3181 Bipolar II disorder: Secondary | ICD-10-CM

## 2024-03-20 DIAGNOSIS — F909 Attention-deficit hyperactivity disorder, unspecified type: Secondary | ICD-10-CM

## 2024-03-20 MED ORDER — LISDEXAMFETAMINE DIMESYLATE 70 MG PO CAPS
70.0000 mg | ORAL_CAPSULE | Freq: Every day | ORAL | 0 refills | Status: AC
  Filled 2024-03-20: qty 90, 90d supply, fill #0

## 2024-03-20 MED ORDER — LISDEXAMFETAMINE DIMESYLATE 50 MG PO CAPS
50.0000 mg | ORAL_CAPSULE | Freq: Every day | ORAL | 0 refills | Status: AC
  Filled 2024-03-20: qty 30, 30d supply, fill #0

## 2024-04-02 ENCOUNTER — Ambulatory Visit: Admitting: Adult Health
